# Patient Record
Sex: Female | Born: 1970
Health system: Southern US, Community
[De-identification: ages and names within clinical notes are randomized; demographics above are authoritative.]

## PROBLEM LIST (undated history)

## (undated) ENCOUNTER — Emergency Department (HOSPITAL_COMMUNITY): Admission: EM | Payer: Self-pay | Source: Home / Self Care

## (undated) DIAGNOSIS — T7840XA Allergy, unspecified, initial encounter: Secondary | ICD-10-CM

## (undated) DIAGNOSIS — Z5189 Encounter for other specified aftercare: Secondary | ICD-10-CM

## (undated) DIAGNOSIS — D649 Anemia, unspecified: Secondary | ICD-10-CM

## (undated) DIAGNOSIS — I10 Essential (primary) hypertension: Secondary | ICD-10-CM

## (undated) DIAGNOSIS — K219 Gastro-esophageal reflux disease without esophagitis: Secondary | ICD-10-CM

## (undated) DIAGNOSIS — D259 Leiomyoma of uterus, unspecified: Secondary | ICD-10-CM

## (undated) HISTORY — PX: TUBAL LIGATION: SHX77

## (undated) HISTORY — DX: Encounter for other specified aftercare: Z51.89

## (undated) HISTORY — DX: Anemia, unspecified: D64.9

## (undated) HISTORY — DX: Gastro-esophageal reflux disease without esophagitis: K21.9

## (undated) HISTORY — DX: Allergy, unspecified, initial encounter: T78.40XA

---

## 2000-05-08 ENCOUNTER — Emergency Department (HOSPITAL_COMMUNITY): Admission: EM | Admit: 2000-05-08 | Discharge: 2000-05-08 | Payer: Self-pay | Admitting: Emergency Medicine

## 2001-07-27 ENCOUNTER — Encounter: Payer: Self-pay | Admitting: Emergency Medicine

## 2001-07-27 ENCOUNTER — Emergency Department (HOSPITAL_COMMUNITY): Admission: EM | Admit: 2001-07-27 | Discharge: 2001-07-27 | Payer: Self-pay | Admitting: Emergency Medicine

## 2001-07-29 ENCOUNTER — Ambulatory Visit (HOSPITAL_COMMUNITY): Admission: RE | Admit: 2001-07-29 | Discharge: 2001-07-29 | Payer: Self-pay | Admitting: Emergency Medicine

## 2001-07-29 ENCOUNTER — Encounter: Payer: Self-pay | Admitting: Emergency Medicine

## 2002-04-23 ENCOUNTER — Emergency Department (HOSPITAL_COMMUNITY): Admission: EM | Admit: 2002-04-23 | Discharge: 2002-04-24 | Payer: Self-pay | Admitting: Emergency Medicine

## 2004-09-07 ENCOUNTER — Emergency Department (HOSPITAL_COMMUNITY): Admission: EM | Admit: 2004-09-07 | Discharge: 2004-09-07 | Payer: Self-pay | Admitting: Emergency Medicine

## 2005-04-07 ENCOUNTER — Emergency Department (HOSPITAL_COMMUNITY): Admission: EM | Admit: 2005-04-07 | Discharge: 2005-04-07 | Payer: Self-pay | Admitting: Emergency Medicine

## 2006-12-23 ENCOUNTER — Emergency Department (HOSPITAL_COMMUNITY): Admission: EM | Admit: 2006-12-23 | Discharge: 2006-12-23 | Payer: Self-pay | Admitting: Emergency Medicine

## 2007-02-05 ENCOUNTER — Emergency Department (HOSPITAL_COMMUNITY): Admission: EM | Admit: 2007-02-05 | Discharge: 2007-02-05 | Payer: Self-pay | Admitting: Emergency Medicine

## 2012-07-14 ENCOUNTER — Inpatient Hospital Stay (HOSPITAL_COMMUNITY)
Admission: AD | Admit: 2012-07-14 | Discharge: 2012-07-14 | Disposition: A | Discharge status: Home / Self Care | Payer: Self-pay | Source: Ambulatory Visit | Attending: Obstetrics & Gynecology | Admitting: Obstetrics & Gynecology

## 2012-07-14 ENCOUNTER — Encounter (HOSPITAL_COMMUNITY): Payer: Self-pay | Admitting: *Deleted

## 2012-07-14 DIAGNOSIS — N920 Excessive and frequent menstruation with regular cycle: Secondary | ICD-10-CM | POA: Insufficient documentation

## 2012-07-14 DIAGNOSIS — I10 Essential (primary) hypertension: Secondary | ICD-10-CM | POA: Insufficient documentation

## 2012-07-14 DIAGNOSIS — D259 Leiomyoma of uterus, unspecified: Secondary | ICD-10-CM | POA: Insufficient documentation

## 2012-07-14 DIAGNOSIS — N946 Dysmenorrhea, unspecified: Secondary | ICD-10-CM | POA: Diagnosis present

## 2012-07-14 DIAGNOSIS — R109 Unspecified abdominal pain: Secondary | ICD-10-CM | POA: Insufficient documentation

## 2012-07-14 DIAGNOSIS — Z86018 Personal history of other benign neoplasm: Secondary | ICD-10-CM

## 2012-07-14 HISTORY — DX: Leiomyoma of uterus, unspecified: D25.9

## 2012-07-14 HISTORY — DX: Essential (primary) hypertension: I10

## 2012-07-14 LAB — CBC
Hemoglobin: 10.7 g/dL — ABNORMAL LOW (ref 12.0–15.0)
MCH: 26 pg (ref 26.0–34.0)
MCHC: 31.7 g/dL (ref 30.0–36.0)
MCV: 82.2 fL (ref 78.0–100.0)
RBC: 4.11 MIL/uL (ref 3.87–5.11)

## 2012-07-14 LAB — URINALYSIS, ROUTINE W REFLEX MICROSCOPIC
Bilirubin Urine: NEGATIVE
Glucose, UA: NEGATIVE mg/dL
Hgb urine dipstick: NEGATIVE
Ketones, ur: NEGATIVE mg/dL
Leukocytes, UA: NEGATIVE
Nitrite: NEGATIVE
Protein, ur: NEGATIVE mg/dL
Specific Gravity, Urine: >1.03 — ABNORMAL HIGH (ref 1.005–1.030)
Urobilinogen, UA: 0.2 mg/dL (ref 0.0–1.0)
pH: 6 (ref 5.0–8.0)

## 2012-07-14 LAB — POCT PREGNANCY, URINE: Preg Test, Ur: NEGATIVE

## 2012-07-14 LAB — WET PREP, GENITAL

## 2012-07-14 MED ORDER — IBUPROFEN 600 MG PO TABS: 600.0000 mg | ORAL_TABLET | Freq: Four times a day (QID) | ORAL | Status: DC | PRN

## 2012-07-14 MED ORDER — HYDROCHLOROTHIAZIDE 25 MG PO TABS: 25.0000 mg | ORAL_TABLET | Freq: Every day | ORAL | Status: DC

## 2012-07-14 MED ORDER — KETOROLAC TROMETHAMINE 60 MG/2ML IM SOLN
60.0000 mg | INTRAMUSCULAR | Status: AC
  Administered 2012-07-14: 60 mg via INTRAMUSCULAR
  Filled 2012-07-14: qty 2

## 2012-07-14 NOTE — MAU Provider Note (Signed)
Attestation of Attending Supervision of Advanced Practitioner (CNM/NP): Evaluation and management procedures were performed by the Advanced Practitioner under my supervision and collaboration.  I have reviewed the Advanced Practitioner's note and chart, and I agree with the management and plan.  Kyrsten Deleeuw, MD, FACOG Attending Obstetrician & Gynecologist Faculty Practice, [facility]  

## 2012-07-14 NOTE — MAU Provider Note (Signed)
Chief Complaint: Abdominal Pain   First Provider Initiated Contact with Patient 07/14/12 1432     SUBJECTIVE HPI: Becky Koch is a 40 y.o. [id number] who presents to maternity admissions reporting severe abdominal pain during menses described as cramping, accompanied by pelvic and rectal pressure which is constant. She also had an episode of dizziness today.  She was diagnosed with uterine fibroids ~1 year ago but has never had an U/S for this.  She was prescribed OCPs temporarily to reduce bleeding, which worked, but now she is out of these.  She is also out of her blood pressure medication.  She recently moved to [facility] and needs to establish care.  She denies vaginal itching/burning, urinary symptoms, h/a, n/v, or fever/chills.     Past Medical History  Diagnosis Date  . Fibroid, uterine   . Hypertension    Past Surgical History  Procedure Date  . Tubal ligation    History   Social History  . Marital Status: Legally Separated    Spouse Name: N/A    Number of Children: N/A  . Years of Education: N/A   Occupational History  . Not on file.   Social History Main Topics  . Smoking status: Current Every Day Smoker -- 0.2 packs/day    Types: Cigarettes  . Smokeless tobacco: Not on file  . Alcohol Use: Yes     Comment: social alcohol  . Drug Use: No  . Sexually Active: Yes    Birth Control/ Protection: Surgical   Other Topics Concern  . Not on file   Social History Narrative  . No narrative on file   No current facility-administered medications on file prior to encounter.   No current outpatient prescriptions on file prior to encounter.   No Known Allergies  ROS: Pertinent items in HPI  OBJECTIVE Blood pressure 161/108, pulse 95, temperature 98.8 F (37.1 C), temperature source Oral, resp. rate 18, last menstrual period 06/11/2012. GENERAL: Well-developed, well-nourished female in no acute distress.  HEENT: Normocephalic HEART: normal rate RESP: normal  effort ABDOMEN: Soft, non-tender EXTREMITIES: Nontender, no edema NEURO: Alert and oriented Pelvic exam: Cervix pink, visually closed, without lesion, moderate amount white/yellow creamy discharge, vaginal walls and external genitalia normal Bimanual exam: Cervix 0/long/high, firm, anterior, neg CMT, uterus nontender, nonenlarged, irregularly shaped, mild adnexal tenderness bilaterally, no enlargement, or mass  LAB RESULTS Results for orders placed during the hospital encounter of 07/14/12 (from the past 24 hour(s))  URINALYSIS, ROUTINE W REFLEX MICROSCOPIC     Status: Abnormal   Collection Time   07/14/12  2:05 PM      Component Value Range   Color, Urine YELLOW  YELLOW   APPearance HAZY (*) CLEAR   Specific Gravity, Urine >1.030 (*) 1.005 - 1.030   pH 6.0  5.0 - 8.0   Glucose, UA NEGATIVE  NEGATIVE mg/dL   Hgb urine dipstick NEGATIVE  NEGATIVE   Bilirubin Urine NEGATIVE  NEGATIVE   Ketones, ur NEGATIVE  NEGATIVE mg/dL   Protein, ur NEGATIVE  NEGATIVE mg/dL   Urobilinogen, UA 0.2  0.0 - 1.0 mg/dL   Nitrite NEGATIVE  NEGATIVE   Leukocytes, UA NEGATIVE  NEGATIVE  CBC     Status: Abnormal   Collection Time   07/14/12  2:45 PM      Component Value Range   WBC 4.3  4.0 - 10.5 K/uL   RBC 4.11  3.87 - 5.11 MIL/uL   Hemoglobin 10.7 (*) 12.0 - 15.0 g/dL   HCT 96.2 (*)  36.0 - 46.0 %   MCV 82.2  78.0 - 100.0 fL   MCH 26.0  26.0 - 34.0 pg   MCHC 31.7  30.0 - 36.0 g/dL   RDW 16.1 (*) 09.6 - 04.5 %   Platelets 273  150 - 400 K/uL  WET PREP, GENITAL     Status: Abnormal   Collection Time   07/14/12  2:45 PM      Component Value Range   Yeast Wet Prep HPF POC NONE SEEN  NONE SEEN   Trich, Wet Prep NONE SEEN  NONE SEEN   Clue Cells Wet Prep HPF POC FEW (*) NONE SEEN   WBC, Wet Prep HPF POC FEW (*) NONE SEEN  POCT PREGNANCY, URINE     Status: Normal   Collection Time   07/14/12  3:14 PM      Component Value Range   Preg Test, Ur NEGATIVE  NEGATIVE     ASSESSMENT 1. Dysmenorrhea    2. Menorrhagia   3. History of uterine fibroid   4. HTN (hypertension)     PLAN Discharge home Message sent to Gyn clinic for f/u Outpatient U/S ordered  HCTZ 25 mg daily PO x30 tabs per Dr Macon Large Ibuprofen 600 mg PO Q6 hours PRN Recommend women's multivitamin and increased PO iron Increase PO fluids and fiber Pt given contact information for family practice to f/u for HTN Return to MAU as needed   Sharen Counter Certified Nurse-Midwife 07/14/2012  2:49 PM

## 2012-07-14 NOTE — MAU Note (Signed)
Lower abd pain started 2 years ago, dx'd with fibroids, pain is getting worse & worse.  Pt was on OCP to control her bleeding, but ran out last month.  Has moved, not seeing same dr.  Denies bleeding today.

## 2012-07-15 LAB — GC/CHLAMYDIA PROBE AMP, GENITAL
Chlamydia, DNA Probe: NEGATIVE
GC Probe Amp, Genital: NEGATIVE

## 2012-07-22 ENCOUNTER — Other Ambulatory Visit (HOSPITAL_COMMUNITY): Payer: Self-pay | Admitting: Advanced Practice Midwife

## 2012-07-22 ENCOUNTER — Ambulatory Visit (HOSPITAL_COMMUNITY): Admit: 2012-07-22 | Payer: Self-pay

## 2012-07-22 DIAGNOSIS — N946 Dysmenorrhea, unspecified: Secondary | ICD-10-CM

## 2012-07-22 DIAGNOSIS — N920 Excessive and frequent menstruation with regular cycle: Secondary | ICD-10-CM

## 2012-07-22 DIAGNOSIS — Z86018 Personal history of other benign neoplasm: Secondary | ICD-10-CM

## 2012-07-29 ENCOUNTER — Ambulatory Visit (HOSPITAL_COMMUNITY): Payer: Self-pay | Attending: Advanced Practice Midwife

## 2012-08-06 ENCOUNTER — Ambulatory Visit (HOSPITAL_COMMUNITY)
Admission: RE | Admit: 2012-08-06 | Discharge: 2012-08-06 | Disposition: A | Discharge status: Home / Self Care | Payer: Self-pay | Source: Ambulatory Visit | Attending: Advanced Practice Midwife | Admitting: Advanced Practice Midwife

## 2012-08-06 DIAGNOSIS — N946 Dysmenorrhea, unspecified: Secondary | ICD-10-CM | POA: Insufficient documentation

## 2012-08-06 DIAGNOSIS — N920 Excessive and frequent menstruation with regular cycle: Secondary | ICD-10-CM | POA: Insufficient documentation

## 2012-08-06 DIAGNOSIS — Z86018 Personal history of other benign neoplasm: Secondary | ICD-10-CM

## 2012-09-08 ENCOUNTER — Telehealth: Payer: Self-pay | Admitting: General Practice

## 2012-09-08 NOTE — Telephone Encounter (Signed)
Patient called requesting results from 12/18 ultrasound.

## 2012-09-09 NOTE — Telephone Encounter (Signed)
Returned pt's call and left message on her personal voice mail that per the midwife's note in November, she needs clinic appt for follow up. At this appt she will be informed of her [place] results and be able to discuss her current status so that changes in plan of care can be considered. I have scheduled an appt on 09/12/12 @ 1045. If this day and time is not acceptable, please call back to reschedule.

## 2012-09-12 ENCOUNTER — Encounter: Payer: Self-pay | Admitting: Medical

## 2012-09-16 ENCOUNTER — Emergency Department (INDEPENDENT_AMBULATORY_CARE_PROVIDER_SITE_OTHER)
Admission: EM | Admit: 2012-09-16 | Discharge: 2012-09-16 | Disposition: A | Discharge status: Home / Self Care | Payer: Self-pay | Source: Home / Self Care | Attending: Emergency Medicine | Admitting: Emergency Medicine

## 2012-09-16 ENCOUNTER — Encounter (HOSPITAL_COMMUNITY): Payer: Self-pay | Admitting: *Deleted

## 2012-09-16 DIAGNOSIS — M545 Low back pain, unspecified: Secondary | ICD-10-CM

## 2012-09-16 MED ORDER — MELOXICAM 7.5 MG PO TABS: 7.5000 mg | ORAL_TABLET | Freq: Every day | ORAL | Status: AC

## 2012-09-16 MED ORDER — CYCLOBENZAPRINE HCL 10 MG PO TABS: 10.0000 mg | ORAL_TABLET | Freq: Two times a day (BID) | ORAL | Status: DC | PRN

## 2012-09-16 NOTE — ED Provider Notes (Signed)
History     CSN: [id number]  Arrival date & time 09/16/12  1252   First MD Initiated Contact with Patient 09/16/12 1329      Chief Complaint  Patient presents with  . Back Pain    (Consider location/radiation/quality/duration/timing/severity/associated sxs/prior treatment) HPI Comments: Patient presents urgent care this afternoon complaining of left lower back pain, she describes that she fell at home she slipped on one of her grandson boys, landing on her left side. She's been taking 800 mg of motrin as it has been previously prescribed to her for another problem. Patient denies any urinary symptoms, changes in bowel habits. And denies any numbness or tingling sensation or weakness of her lower extremities. Patient also denies any constitutional symptoms such as unintended weight loss, changes in appetite or fevers.  Patient is a 42 y.o. female presenting with back pain. The history is provided by the patient.  Back Pain  This is a new problem. The problem occurs constantly. The problem has not changed since onset.The pain is associated with falling. The pain is present in the lumbar spine. The quality of the pain is described as stabbing and shooting. The pain does not radiate. The pain is at a severity of 6/10. The patient is experiencing no pain. The symptoms are aggravated by twisting, bending and certain positions. Stiffness is present all day. Pertinent negatives include no chest pain, no fever, no numbness, no weight loss, no abdominal pain, no abdominal swelling, no bowel incontinence, no pelvic pain, no leg pain, no paresthesias, no paresis, no tingling and no weakness. The treatment provided no relief.    Past Medical History  Diagnosis Date  . Fibroid, uterine   . Hypertension     Past Surgical History  Procedure Date  . Tubal ligation     Family History  Problem Relation Age of Onset  . Family history unknown: Yes    History  Substance Use Topics  . Smoking  status: Current Every Day Smoker -- 0.2 packs/day    Types: Cigarettes  . Smokeless tobacco: Not on file  . Alcohol Use: Yes     Comment: social alcohol    OB History    Grav Para Term Preterm Abortions TAB SAB Ect Mult Living   8 4 4  4 3 1   4       Review of Systems  Constitutional: Positive for activity change. Negative for fever, chills, weight loss, diaphoresis, appetite change and fatigue.  HENT: Negative for facial swelling, neck pain, neck stiffness and ear discharge.   Respiratory: Negative for shortness of breath.   Cardiovascular: Negative for chest pain.  Gastrointestinal: Negative for abdominal pain and bowel incontinence.  Genitourinary: Negative for flank pain and pelvic pain.  Musculoskeletal: Positive for back pain. Negative for myalgias, joint swelling, arthralgias and gait problem.  Skin: Negative for rash and wound.  Neurological: Negative for tingling, weakness, numbness and paresthesias.    Allergies  Review of patient's allergies indicates no known allergies.  Home Medications   Current Outpatient Rx  Name  Route  Sig  Dispense  Refill  . CYCLOBENZAPRINE HCL 10 MG PO TABS   Oral   Take 1 tablet (10 mg total) by mouth 2 (two) times daily as needed for muscle spasms.   20 tablet   0   . HYDROCHLOROTHIAZIDE 25 MG PO TABS   Oral   Take 1 tablet (25 mg total) by mouth daily.   30 tablet   0   .  MELOXICAM 7.5 MG PO TABS   Oral   Take 1 tablet (7.5 mg total) by mouth daily.   10 tablet   0     BP 169/101  Pulse 86  Temp 98.3 F (36.8 C) (Oral)  Resp 18  SpO2 100%  LMP 09/06/2012  Physical Exam  Nursing note and vitals reviewed. Constitutional: Vital signs are normal. She appears well-developed and well-nourished.  Non-toxic appearance. She does not have a sickly appearance. She does not appear ill. No distress.  Musculoskeletal: She exhibits tenderness. She exhibits no edema.       Lumbar back: She exhibits decreased range of motion,  tenderness and pain. She exhibits no bony tenderness, no swelling, no edema, no deformity, no laceration and no spasm.       Back:  Neurological: She is alert.  Skin: No rash noted. No erythema.    ED Course  Procedures (including critical care time)  Labs Reviewed - No data to display No results found.   1. Lumbar back pain       MDM  Status post fall 4 days ago with left paravertebral lower lumbar pain. Exam symptoms not consistent with fractures or subluxations. Prescribe patient and a course of blocks and will muscle relaxer encouraged and instructed to followup with orthopedic doctor pain was to persist beyond 7-10 days. Patient agreed with treatment plan and followup care        Jimmie Molly, MD 09/16/12 1530

## 2012-09-16 NOTE — ED Notes (Signed)
Pt reports back pain that started after she fell on a toy and landed on back./hip

## 2012-10-02 ENCOUNTER — Encounter: Payer: Self-pay | Admitting: Medical

## 2012-10-24 ENCOUNTER — Encounter: Payer: Self-pay | Admitting: Obstetrics & Gynecology

## 2012-11-13 ENCOUNTER — Encounter: Payer: Self-pay | Admitting: Obstetrics & Gynecology

## 2012-11-13 ENCOUNTER — Other Ambulatory Visit: Payer: Self-pay | Admitting: Obstetrics & Gynecology

## 2012-11-13 ENCOUNTER — Ambulatory Visit (INDEPENDENT_AMBULATORY_CARE_PROVIDER_SITE_OTHER): Payer: BC Managed Care – PPO | Admitting: Obstetrics & Gynecology

## 2012-11-13 VITALS — BP 138/92 | HR 93 | Ht 65.5 in | Wt 131.7 lb

## 2012-11-13 DIAGNOSIS — N946 Dysmenorrhea, unspecified: Secondary | ICD-10-CM

## 2012-11-13 DIAGNOSIS — I10 Essential (primary) hypertension: Secondary | ICD-10-CM

## 2012-11-13 DIAGNOSIS — Z0000 Encounter for general adult medical examination without abnormal findings: Secondary | ICD-10-CM

## 2012-11-13 DIAGNOSIS — N920 Excessive and frequent menstruation with regular cycle: Secondary | ICD-10-CM

## 2012-11-13 MED ORDER — LOSARTAN POTASSIUM-HCTZ 100-12.5 MG PO TABS: 1.0000 | ORAL_TABLET | Freq: Every day | ORAL | Status: DC

## 2012-11-13 NOTE — Progress Notes (Signed)
Patient ID: Becky Koch, female   DOB: 09-01-1970, 42 y.o.   MRN: [id number]  Chief Complaint  Patient presents with  . Fibroids    HPI Becky Koch is a 42 y.o. female.  [id number] Patient's last menstrual period was 09/22/2012. S/P BTL, long H/O dysmenorrhea and menorrhagia, menses regular, 8 days.  HPI  Past Medical History  Diagnosis Date  . Fibroid, uterine   . Hypertension     Past Surgical History  Procedure Laterality Date  . Tubal ligation      No family history on file.  Social History History  Substance Use Topics  . Smoking status: Current Every Day Smoker -- 0.25 packs/day    Types: Cigarettes  . Smokeless tobacco: Not on file  . Alcohol Use: Yes     Comment: social alcohol    No Known Allergies  Current Outpatient Prescriptions  Medication Sig Dispense Refill  . losartan-hydrochlorothiazide (HYZAAR) 100-12.5 MG per tablet Take 1 tablet by mouth daily.  30 tablet  1  . Multiple Vitamins-Minerals (WOMENS MULTI VITAMIN & MINERAL PO) Take 1 tablet by mouth.       No current facility-administered medications for this visit.    Review of Systems Review of Systems  Constitutional: Negative.   Genitourinary: Positive for menstrual problem and pelvic pain. Negative for dysuria, vaginal bleeding and vaginal discharge.    Blood pressure 138/92, pulse 93, height 5' 5.5" (1.664 m), weight 131 lb 11.2 oz (59.739 kg), last menstrual period 09/22/2012.  Physical Exam Physical Exam  Constitutional: She is oriented to person, place, and time. She appears well-developed and well-nourished. No distress.  Pulmonary/Chest: Effort normal. No respiratory distress.  Abdominal: Soft. There is no tenderness.  Genitourinary: Vagina normal and uterus normal. No vaginal discharge found.  Pap done no mass  Neurological: She is alert and oriented to person, place, and time.  Skin: Skin is warm and dry. No pallor.  Psychiatric: She has a normal mood and affect. Her behavior  is normal.    Data Reviewed   *RADIOLOGY REPORT*  Clinical Data: Dysmenorrhea. Menorrhagia. Fibroids. LMP  07/30/2012.  TRANSABDOMINAL AND TRANSVAGINAL ULTRASOUND OF PELVIS  Technique: Both transabdominal and transvaginal ultrasound  examinations of the pelvis were performed. Transabdominal  technique was performed for global imaging of the pelvis including  uterus, ovaries, adnexal regions, and pelvic cul-de-sac.  It was necessary to proceed with endovaginal exam following the  transabdominal exam to visualize the endometrium and adnexa.  Comparison: None.  Findings:  Uterus: 10.0 x 4.4 x 6.2 cm. No fibroids or other uterine mass  identified.  Endometrium: Double layer thickness measures 14 mm transvaginally.  No focal lesion visualized.  Right ovary: 2.6 x 1.5 x 1.8 cm. Normal appearance.  Left ovary: 4.7 x 2.0 x 2.7 cm. Normal appearance.  Other Findings: No free fluid  IMPRESSION:  1. No evidence of pelvic mass or other significant abnormality.  2. Endometrial thickness measures 14 mm. If bleeding remains  unresponsive to hormonal or medical therapy, sonohysterogram should  be considered for focal lesion work-up. (Ref: Radiological  Reasoning: Algorithmic Workup of Abnormal Vaginal Bleeding with  Endovaginal Sonography and Sonohysterography. AJR 2008; 147:W29-56)  Original Report Authenticated By: Myles Rosenthal, M.D.   Assessment    Dysmenorrhea and menorrhagia, nl [place]     Plan    Considered hormonal management, ablation, Mirena. Wants to schedule Mirena, procedure discussed and questions answered. Take NSAID pre-insertion        ARNOLD,JAMES 11/13/2012, 5:17 PM

## 2012-11-13 NOTE — Patient Instructions (Addendum)
Levonorgestrel intrauterine device (IUD) What is this medicine? LEVONORGESTREL IUD (LEE voe nor jes trel) is a contraceptive (birth control) device. The device is placed inside the uterus by a healthcare professional. It is used to prevent pregnancy and can also be used to treat heavy bleeding that occurs during your period. Depending on the device, it can be used for 3 to 5 years. This medicine may be used for other purposes; ask your health care provider or pharmacist if you have questions. What should I tell my health care provider before I take this medicine? They need to know if you have any of these conditions: -abnormal Pap smear -cancer of the breast, uterus, or cervix -diabetes -endometritis -genital or pelvic infection now or in the past -have more than one sexual partner or your partner has more than one partner -heart disease -history of an ectopic or tubal pregnancy -immune system problems -IUD in place -liver disease or tumor -problems with blood clots or take blood-thinners -use intravenous drugs -uterus of unusual shape -vaginal bleeding that has not been explained -an unusual or allergic reaction to levonorgestrel, other hormones, silicone, or polyethylene, medicines, foods, dyes, or preservatives -pregnant or trying to get pregnant -breast-feeding How should I use this medicine? This device is placed inside the uterus by a health care professional. Talk to your pediatrician regarding the use of this medicine in children. Special care may be needed. Overdosage: If you think you have taken too much of this medicine contact a poison control center or emergency room at once. NOTE: This medicine is only for you. Do not share this medicine with others. What if I miss a dose? This does not apply. What may interact with this medicine? Do not take this medicine with any of the following medications: -amprenavir -bosentan -fosamprenavir This medicine may also interact with  the following medications: -aprepitant -barbiturate medicines for inducing sleep or treating seizures -bexarotene -griseofulvin -medicines to treat seizures like carbamazepine, ethotoin, felbamate, oxcarbazepine, phenytoin, topiramate -modafinil -pioglitazone -rifabutin -rifampin -rifapentine -some medicines to treat HIV infection like atazanavir, indinavir, lopinavir, nelfinavir, tipranavir, ritonavir -St. John's wort -warfarin This list may not describe all possible interactions. Give your health care provider a list of all the medicines, herbs, non-prescription drugs, or dietary supplements you use. Also tell them if you smoke, drink alcohol, or use illegal drugs. Some items may interact with your medicine. What should I watch for while using this medicine? Visit your doctor or health care professional for regular check ups. See your doctor if you or your partner has sexual contact with others, becomes HIV positive, or gets a sexual transmitted disease. This product does not protect you against HIV infection (AIDS) or other sexually transmitted diseases. You can check the placement of the IUD yourself by reaching up to the top of your vagina with clean fingers to feel the threads. Do not pull on the threads. It is a good habit to check placement after each menstrual period. Call your doctor right away if you feel more of the IUD than just the threads or if you cannot feel the threads at all. The IUD may come out by itself. You may become pregnant if the device comes out. If you notice that the IUD has come out use a backup birth control method like condoms and call your health care provider. Using tampons will not change the position of the IUD and are okay to use during your period. What side effects may I notice from receiving this medicine?   Side effects that you should report to your doctor or health care professional as soon as possible: -allergic reactions like skin rash, itching or  hives, swelling of the face, lips, or tongue -fever, flu-like symptoms -genital sores -high blood pressure -no menstrual period for 6 weeks during use -pain, swelling, warmth in the leg -pelvic pain or tenderness -severe or sudden headache -signs of pregnancy -stomach cramping -sudden shortness of breath -trouble with balance, talking, or walking -unusual vaginal bleeding, discharge -yellowing of the eyes or skin Side effects that usually do not require medical attention (report to your doctor or health care professional if they continue or are bothersome): -acne -breast pain -change in sex drive or performance -changes in weight -cramping, dizziness, or faintness while the device is being inserted -headache -irregular menstrual bleeding within first 3 to 6 months of use -nausea This list may not describe all possible side effects. Call your doctor for medical advice about side effects. You may report side effects to FDA at 1-800-FDA-1088. Where should I keep my medicine? This does not apply. NOTE: This sheet is a summary. It may not cover all possible information. If you have questions about this medicine, talk to your doctor, pharmacist, or health care provider.  2013, [employer]vier/Gold Standard. (09/06/2011 1:54:04 PM)  

## 2012-11-14 ENCOUNTER — Emergency Department (HOSPITAL_COMMUNITY)
Admission: EM | Admit: 2012-11-14 | Discharge: 2012-11-14 | Disposition: A | Discharge status: Home / Self Care | Payer: No Typology Code available for payment source | Attending: Emergency Medicine | Admitting: Emergency Medicine

## 2012-11-14 ENCOUNTER — Emergency Department (HOSPITAL_COMMUNITY): Payer: No Typology Code available for payment source

## 2012-11-14 ENCOUNTER — Encounter (HOSPITAL_COMMUNITY): Payer: Self-pay | Admitting: *Deleted

## 2012-11-14 DIAGNOSIS — S0990XA Unspecified injury of head, initial encounter: Secondary | ICD-10-CM | POA: Insufficient documentation

## 2012-11-14 DIAGNOSIS — T148XXA Other injury of unspecified body region, initial encounter: Secondary | ICD-10-CM

## 2012-11-14 DIAGNOSIS — I10 Essential (primary) hypertension: Secondary | ICD-10-CM | POA: Insufficient documentation

## 2012-11-14 DIAGNOSIS — IMO0002 Reserved for concepts with insufficient information to code with codable children: Secondary | ICD-10-CM | POA: Insufficient documentation

## 2012-11-14 DIAGNOSIS — Z79899 Other long term (current) drug therapy: Secondary | ICD-10-CM | POA: Insufficient documentation

## 2012-11-14 DIAGNOSIS — Y9389 Activity, other specified: Secondary | ICD-10-CM | POA: Insufficient documentation

## 2012-11-14 DIAGNOSIS — Y92410 Unspecified street and highway as the place of occurrence of the external cause: Secondary | ICD-10-CM | POA: Insufficient documentation

## 2012-11-14 DIAGNOSIS — F17200 Nicotine dependence, unspecified, uncomplicated: Secondary | ICD-10-CM | POA: Insufficient documentation

## 2012-11-14 DIAGNOSIS — S4980XA Other specified injuries of shoulder and upper arm, unspecified arm, initial encounter: Secondary | ICD-10-CM | POA: Insufficient documentation

## 2012-11-14 DIAGNOSIS — Z8742 Personal history of other diseases of the female genital tract: Secondary | ICD-10-CM | POA: Insufficient documentation

## 2012-11-14 DIAGNOSIS — T07XXXA Unspecified multiple injuries, initial encounter: Secondary | ICD-10-CM | POA: Insufficient documentation

## 2012-11-14 DIAGNOSIS — S46909A Unspecified injury of unspecified muscle, fascia and tendon at shoulder and upper arm level, unspecified arm, initial encounter: Secondary | ICD-10-CM | POA: Insufficient documentation

## 2012-11-14 DIAGNOSIS — S139XXA Sprain of joints and ligaments of unspecified parts of neck, initial encounter: Secondary | ICD-10-CM

## 2012-11-14 MED ORDER — HYDROCODONE-ACETAMINOPHEN 5-325 MG PO TABS: 2.0000 | ORAL_TABLET | ORAL | Status: DC | PRN

## 2012-11-14 MED ORDER — HYDROCODONE-ACETAMINOPHEN 5-325 MG PO TABS
1.0000 | ORAL_TABLET | Freq: Once | ORAL | Status: AC
  Administered 2012-11-14: 1 via ORAL
  Filled 2012-11-14: qty 1
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
## 2012-11-17 ENCOUNTER — Ambulatory Visit (HOSPITAL_COMMUNITY)
Admission: RE | Admit: 2012-11-17 | Discharge: 2012-11-17 | Disposition: A | Discharge status: Home / Self Care | Payer: BC Managed Care – PPO | Source: Ambulatory Visit | Attending: Obstetrics & Gynecology | Admitting: Obstetrics & Gynecology

## 2012-11-17 DIAGNOSIS — Z0000 Encounter for general adult medical examination without abnormal findings: Secondary | ICD-10-CM

## 2012-11-17 DIAGNOSIS — Z1231 Encounter for screening mammogram for malignant neoplasm of breast: Secondary | ICD-10-CM

## 2012-12-08 ENCOUNTER — Ambulatory Visit: Payer: BC Managed Care – PPO | Admitting: Obstetrics & Gynecology

## 2012-12-31 ENCOUNTER — Encounter: Payer: Self-pay | Admitting: Obstetrics & Gynecology

## 2012-12-31 ENCOUNTER — Ambulatory Visit (INDEPENDENT_AMBULATORY_CARE_PROVIDER_SITE_OTHER): Payer: BC Managed Care – PPO | Admitting: Obstetrics & Gynecology

## 2012-12-31 VITALS — BP 135/91 | HR 68 | Temp 97.4°F | Ht 65.5 in | Wt 135.4 lb

## 2012-12-31 DIAGNOSIS — Z30430 Encounter for insertion of intrauterine contraceptive device: Secondary | ICD-10-CM

## 2012-12-31 DIAGNOSIS — N920 Excessive and frequent menstruation with regular cycle: Secondary | ICD-10-CM

## 2012-12-31 DIAGNOSIS — N946 Dysmenorrhea, unspecified: Secondary | ICD-10-CM

## 2012-12-31 NOTE — Progress Notes (Signed)
Patient ID: Becky Koch, female   DOB: 16-Nov-1970, 42 y.o.   MRN: [id number] Patient's last menstrual period was 12/15/2012.She elects to have Mirena for management of menorrhagia Patient identified, informed consent performed, signed copy in chart, time out was performed.  She has had BTL  Speculum placed in the vagina.  Cervix visualized.  Cleaned with Betadine x 2.   Hurricaine spray applied. Grasped anteriorly with a single tooth tenaculum.  Uterus sounded to 7.5 cm.  Mirena IUD placed per manufacturer's recommendations.  Strings trimmed to 3 cm.   Patient given post procedure instructions and Mirena care card with expiration date.  Patient is asked to check IUD strings periodically and follow up in 4-6 weeks for IUD check.  Adam Phenix, MD 12/31/2012 4:25 PM

## 2012-12-31 NOTE — Patient Instructions (Signed)
Levonorgestrel intrauterine device (IUD) What is this medicine? LEVONORGESTREL IUD (LEE voe nor jes trel) is a contraceptive (birth control) device. The device is placed inside the uterus by a healthcare professional. It is used to prevent pregnancy and can also be used to treat heavy bleeding that occurs during your period. Depending on the device, it can be used for 3 to 5 years. This medicine may be used for other purposes; ask your health care provider or pharmacist if you have questions. What should I tell my health care provider before I take this medicine? They need to know if you have any of these conditions: -abnormal Pap smear -cancer of the breast, uterus, or cervix -diabetes -endometritis -genital or pelvic infection now or in the past -have more than one sexual partner or your partner has more than one partner -heart disease -history of an ectopic or tubal pregnancy -immune system problems -IUD in place -liver disease or tumor -problems with blood clots or take blood-thinners -use intravenous drugs -uterus of unusual shape -vaginal bleeding that has not been explained -an unusual or allergic reaction to levonorgestrel, other hormones, silicone, or polyethylene, medicines, foods, dyes, or preservatives -pregnant or trying to get pregnant -breast-feeding How should I use this medicine? This device is placed inside the uterus by a health care professional. Talk to your pediatrician regarding the use of this medicine in children. Special care may be needed. Overdosage: If you think you have taken too much of this medicine contact a poison control center or emergency room at once. NOTE: This medicine is only for you. Do not share this medicine with others. What if I miss a dose? This does not apply. What may interact with this medicine? Do not take this medicine with any of the following medications: -amprenavir -bosentan -fosamprenavir This medicine may also interact with  the following medications: -aprepitant -barbiturate medicines for inducing sleep or treating seizures -bexarotene -griseofulvin -medicines to treat seizures like carbamazepine, ethotoin, felbamate, oxcarbazepine, phenytoin, topiramate -modafinil -pioglitazone -rifabutin -rifampin -rifapentine -some medicines to treat HIV infection like atazanavir, indinavir, lopinavir, nelfinavir, tipranavir, ritonavir -St. John's wort -warfarin This list may not describe all possible interactions. Give your health care provider a list of all the medicines, herbs, non-prescription drugs, or dietary supplements you use. Also tell them if you smoke, drink alcohol, or use illegal drugs. Some items may interact with your medicine. What should I watch for while using this medicine? Visit your doctor or health care professional for regular check ups. See your doctor if you or your partner has sexual contact with others, becomes HIV positive, or gets a sexual transmitted disease. This product does not protect you against HIV infection (AIDS) or other sexually transmitted diseases. You can check the placement of the IUD yourself by reaching up to the top of your vagina with clean fingers to feel the threads. Do not pull on the threads. It is a good habit to check placement after each menstrual period. Call your doctor right away if you feel more of the IUD than just the threads or if you cannot feel the threads at all. The IUD may come out by itself. You may become pregnant if the device comes out. If you notice that the IUD has come out use a backup birth control method like condoms and call your health care provider. Using tampons will not change the position of the IUD and are okay to use during your period. What side effects may I notice from receiving this medicine?   Side effects that you should report to your doctor or health care professional as soon as possible: -allergic reactions like skin rash, itching or  hives, swelling of the face, lips, or tongue -fever, flu-like symptoms -genital sores -high blood pressure -no menstrual period for 6 weeks during use -pain, swelling, warmth in the leg -pelvic pain or tenderness -severe or sudden headache -signs of pregnancy -stomach cramping -sudden shortness of breath -trouble with balance, talking, or walking -unusual vaginal bleeding, discharge -yellowing of the eyes or skin Side effects that usually do not require medical attention (report to your doctor or health care professional if they continue or are bothersome): -acne -breast pain -change in sex drive or performance -changes in weight -cramping, dizziness, or faintness while the device is being inserted -headache -irregular menstrual bleeding within first 3 to 6 months of use -nausea This list may not describe all possible side effects. Call your doctor for medical advice about side effects. You may report side effects to FDA at 1-800-FDA-1088. Where should I keep my medicine? This does not apply. NOTE: This sheet is a summary. It may not cover all possible information. If you have questions about this medicine, talk to your doctor, pharmacist, or health care provider.  2013, [employer]vier/[employer] Standard. (09/06/2011 1:54:04 PM)  

## 2013-01-01 MED ORDER — LEVONORGESTREL 20 MCG/24HR IU IUD: 1.0000 | INTRAUTERINE_SYSTEM | Freq: Once | INTRAUTERINE | Status: DC

## 2013-01-05 ENCOUNTER — Encounter: Payer: Self-pay | Admitting: *Deleted

## 2013-01-28 ENCOUNTER — Ambulatory Visit: Payer: BC Managed Care – PPO | Admitting: Obstetrics & Gynecology

## 2013-04-13 ENCOUNTER — Inpatient Hospital Stay (HOSPITAL_COMMUNITY)
Admission: AD | Admit: 2013-04-13 | Discharge: 2013-04-13 | Disposition: A | Discharge status: Home / Self Care | Payer: Self-pay | Source: Ambulatory Visit | Attending: Obstetrics & Gynecology | Admitting: Obstetrics & Gynecology

## 2013-04-13 DIAGNOSIS — Z30431 Encounter for routine checking of intrauterine contraceptive device: Secondary | ICD-10-CM | POA: Insufficient documentation

## 2013-04-13 DIAGNOSIS — I10 Essential (primary) hypertension: Secondary | ICD-10-CM | POA: Insufficient documentation

## 2013-04-13 DIAGNOSIS — M542 Cervicalgia: Secondary | ICD-10-CM | POA: Insufficient documentation

## 2013-04-13 LAB — POCT PREGNANCY, URINE: Preg Test, Ur: NEGATIVE

## 2013-04-13 MED ORDER — LOSARTAN POTASSIUM 100 MG PO TABS: 100.0000 mg | ORAL_TABLET | Freq: Every day | ORAL | Status: DC

## 2013-04-13 MED ORDER — HYDROCHLOROTHIAZIDE 25 MG PO TABS: 25.0000 mg | ORAL_TABLET | Freq: Every day | ORAL | Status: DC

## 2013-04-13 NOTE — MAU Provider Note (Signed)
Attestation of Attending Supervision of Advanced Practitioner (CNM/NP): Evaluation and management procedures were performed by the Advanced Practitioner under my supervision and collaboration.  I have reviewed the Advanced Practitioner's note and chart, and I agree with the management and plan.  HARRAWAY-SMITH, Levaeh Vice 5:39 PM     

## 2013-04-13 NOTE — MAU Provider Note (Signed)
  History     CSN: [id number]  Arrival date and time: 04/13/13 0800   First Provider Initiated Contact with Patient 04/13/13 [phone]      Chief Complaint  Patient presents with  . Hypertension   HPI Comments: Becky Koch 42 y.o. [id number] is in MAU today with elevated BP as she had run out of her Hyzaar ten days ago. She has not established care yet with primary but intends to see Dr Ike Bene and make an appointment today. She noticed some swelling in her ankles yesterday. No other complaints.     Hypertension      Past Medical History  Diagnosis Date  . Fibroid, uterine   . Hypertension     Past Surgical History  Procedure Laterality Date  . Tubal ligation      No family history on file.  History  Substance Use Topics  . Smoking status: Current Every Day Smoker -- 0.25 packs/day    Types: Cigarettes  . Smokeless tobacco: Not on file  . Alcohol Use: Yes     Comment: social alcohol    Allergies: No Known Allergies  Prescriptions prior to admission  Medication Sig Dispense Refill  . ferrous fumarate (HEMOCYTE - 106 MG FE) 325 (106 FE) MG TABS Take 1 tablet by mouth.      Marland Kitchen HYDROcodone-acetaminophen (NORCO/VICODIN) 5-325 MG per tablet Take 2 tablets by mouth every 4 (four) hours as needed for pain.  10 tablet  0  . levonorgestrel (MIRENA) 20 MCG/24HR IUD 1 Intra Uterine Device (1 each total) by Intrauterine route once.  1 each  0  . losartan-hydrochlorothiazide (HYZAAR) 100-12.5 MG per tablet Take 1 tablet by mouth daily.  30 tablet  1  . Multiple Vitamin (MULTIVITAMIN WITH MINERALS) TABS Take 1 tablet by mouth daily.        Review of Systems  Constitutional: Negative.   HENT: Negative.   Eyes: Negative.   Respiratory: Negative.   Cardiovascular: Negative.   Gastrointestinal: Negative.   Genitourinary: Negative.   Musculoskeletal: Negative.        Swelling in feet yesterday  Skin: Negative.   Neurological: Negative.   Endo/Heme/Allergies: Negative.    Psychiatric/Behavioral: Negative.    Physical Exam   Blood pressure 159/100, pulse 79, temperature 98.7 F (37.1 C), temperature source Oral, resp. rate 16, height 5' 5.5" (1.664 m), weight 61.916 kg (136 lb 8 oz), last menstrual period 03/22/2013.  Physical Exam  Constitutional: She is oriented to person, place, and time. She appears well-developed and well-nourished. No distress.  HENT:  Head: Normocephalic.  Eyes: Pupils are equal, round, and reactive to light.  Cardiovascular: Normal rate, regular rhythm and normal heart sounds.   Respiratory: Effort normal and breath sounds normal. No respiratory distress.  Musculoskeletal: Normal range of motion. She exhibits no edema and no tenderness.  Neurological: She is alert and oriented to person, place, and time.  Skin: Skin is dry.  Psychiatric: She has a normal mood and affect.    MAU Course  Procedures  MDM  Assessment and Plan  A: HTN P: Due to cost of Hyzaar will split rx into 2 generics HCTZ and Losartin  Advised to see Dr Ike Bene asap Advised potassium replacement daily with OJ and banana   Carolynn Serve 04/13/2013, 9:44 AM

## 2013-04-13 NOTE — MAU Note (Signed)
Pt states had IUD placed a few months ago in clinic, and was given rx for bp meds. Moved here recently and has not established PCP, is here for eval of HTN, was 170/100's at home last pm. Also notes slight l sided neck pain.

## 2013-11-25 ENCOUNTER — Emergency Department (HOSPITAL_COMMUNITY)
Admission: EM | Admit: 2013-11-25 | Discharge: 2013-11-25 | Disposition: A | Discharge status: Home / Self Care | Payer: BC Managed Care – PPO | Attending: Emergency Medicine | Admitting: Emergency Medicine

## 2013-11-25 ENCOUNTER — Encounter (HOSPITAL_COMMUNITY): Payer: Self-pay | Admitting: Emergency Medicine

## 2013-11-25 DIAGNOSIS — Z8742 Personal history of other diseases of the female genital tract: Secondary | ICD-10-CM | POA: Insufficient documentation

## 2013-11-25 DIAGNOSIS — I10 Essential (primary) hypertension: Secondary | ICD-10-CM | POA: Insufficient documentation

## 2013-11-25 DIAGNOSIS — IMO0002 Reserved for concepts with insufficient information to code with codable children: Secondary | ICD-10-CM | POA: Insufficient documentation

## 2013-11-25 DIAGNOSIS — F17200 Nicotine dependence, unspecified, uncomplicated: Secondary | ICD-10-CM | POA: Insufficient documentation

## 2013-11-25 DIAGNOSIS — M7710 Lateral epicondylitis, unspecified elbow: Secondary | ICD-10-CM

## 2013-11-25 DIAGNOSIS — Z79899 Other long term (current) drug therapy: Secondary | ICD-10-CM | POA: Insufficient documentation

## 2013-11-25 MED ORDER — HYDROCODONE-ACETAMINOPHEN 5-325 MG PO TABS: 1.0000 | ORAL_TABLET | ORAL | Status: DC | PRN

## 2013-11-25 MED ORDER — PREDNISONE 10 MG PO TABS: 40.0000 mg | ORAL_TABLET | Freq: Every day | ORAL | Status: DC

## 2013-11-25 NOTE — ED Notes (Signed)
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   She states she started having elbow and forearm pain x 1 month ago.   Patient states it's getting worse.   Patient BP high today, but she advised that she has a history of same and did take her medication this morning.

## 2013-11-25 NOTE — Discharge Instructions (Signed)
Tennis Elbow Your caregiver has diagnosed you with a condition often referred to as "tennis elbow." This results from small tears or soreness (inflammation) at the start (origin) of the extensor muscles of the forearm. Although the condition is often called tennis or golfer's elbow, it is caused by any repetitive action performed by your elbow. HOME CARE INSTRUCTIONS  If the condition has been short lived, rest may be the only treatment required. Using your opposite hand or arm to perform the task may help. Even changing your grip may help rest the extremity. These may even prevent the condition from recurring.  Longer standing problems, however, will often be relieved faster by:  Using anti-inflammatory agents.  Applying ice packs for 30 minutes at the end of the working day, at bed time, or when activities are finished.  Your caregiver may also have you wear a splint or sling. This will allow the inflamed tendon to heal. At times, steroid injections aided with a local anesthetic will be required along with splinting for 1 to 2 weeks. Two to three steroid injections will often solve the problem. In some long standing cases, the inflamed tendon does not respond to conservative (non-surgical) therapy. Then surgery may be required to repair it. MAKE SURE YOU:   Understand these instructions.  Will watch your condition.  Will get help right away if you are not doing well or get worse. Document Released: 08/06/2005 Document Revised: 10/29/2011 Document Reviewed: 03/24/2008 [facility] Patient Information 2014 [place].  Tendinitis Tendinitis is swelling and inflammation of the tendons. Tendons are band-like tissues that connect muscle to bone. Tendinitis commonly occurs in the:   Shoulders (rotator cuff).  Heels (Achilles tendon).  Elbows (triceps tendon). CAUSES Tendinitis is usually caused by overusing the tendon, muscles, and joints involved. When the tissue surrounding a tendon  (synovium) becomes inflamed, it is called tenosynovitis. Tendinitis commonly develops in people whose jobs require repetitive motions. SYMPTOMS  Pain.  Tenderness.  Mild swelling. DIAGNOSIS Tendinitis is usually diagnosed by physical exam. Your caregiver may also order X-rays or other imaging tests. TREATMENT Your caregiver may recommend certain medicines or exercises for your treatment. HOME CARE INSTRUCTIONS   Use a sling or splint for as long as directed by your caregiver until the pain decreases.  Put ice on the injured area.  Put ice in a plastic bag.  Place a towel between your skin and the bag.  Leave the ice on for 15-20 minutes, 03-04 times a day.  Avoid using the limb while the tendon is painful. Perform gentle range of motion exercises only as directed by your caregiver. Stop exercises if pain or discomfort increase, unless directed otherwise by your caregiver.  Only take over-the-counter or prescription medicines for pain, discomfort, or fever as directed by your caregiver. SEEK MEDICAL CARE IF:   Your pain and swelling increase.  You develop new, unexplained symptoms, especially increased numbness in the hands. MAKE SURE YOU:   Understand these instructions.  Will watch your condition.  Will get help right away if you are not doing well or get worse. Document Released: 08/03/2000 Document Revised: 10/29/2011 Document Reviewed: 10/23/2010 [facility] Patient Information 2014 [place], [place].

## 2013-11-25 NOTE — ED Provider Notes (Signed)
CSN: [id number]     Arrival date & time 11/25/13  5102 History   First MD Initiated Contact with Patient 11/25/13 [phone]   This chart was scribed for Earney Navy, a non-physician practitioner working with No att. providers found by Denice Bors, ED Scribe. This patient was seen in room TR06C/TR06C and the patient's care was started at 9:40 AM      Chief Complaint  Patient presents with  . Arm Pain     (Consider location/radiation/quality/duration/timing/severity/associated sxs/prior Treatment) The history is provided by the patient. No language interpreter was used.   HPI Comments: Becky Koch is a 43 y.o. female who presents to the Emergency Department complaining of constant right arm pain onset 1 month ago. Describes pain as worsening in severity. 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 Reports pain is exacerbated by movement. Denies any alleviating factors. Reports trying ibuprofen with no relief of symptoms. Denies associated numbness, recent trauma, and fever.   Past Medical History  Diagnosis Date  . Fibroid, uterine   . Hypertension    Past Surgical History  Procedure Laterality Date  . Tubal ligation     No family history on file. History  Substance Use Topics  . Smoking status: Current Every Day Smoker -- 0.25 packs/day    Types: Cigarettes  . Smokeless tobacco: Not on file  . Alcohol Use: Yes     Comment: social alcohol   OB History   Grav Para Term Preterm Abortions TAB SAB Ect Mult Living   8 4 4  4 3 1   4      Review of Systems  Constitutional: Negative for fever.  Musculoskeletal: Positive for myalgias (right arm pain ).  Skin: Negative for wound.      Allergies  Review of patient's allergies indicates no known allergies.  Home Medications   Current Outpatient Rx  Name  Route  Sig  Dispense  Refill  . esomeprazole (NEXIUM) 20 MG capsule   Oral   Take 20 mg by mouth daily at 12 noon.         . ferrous  fumarate (HEMOCYTE - 106 MG FE) 325 (106 FE) MG TABS   Oral   Take 1 tablet by mouth.         Marland Kitchen ibuprofen (ADVIL,MOTRIN) 200 MG tablet   Oral   Take 600 mg by mouth every 6 (six) hours as needed for mild pain.         Marland Kitchen levonorgestrel (MIRENA) 20 MCG/24HR IUD   Intrauterine   1 Intra Uterine Device (1 each total) by Intrauterine route once.   1 each   0   . losartan-hydrochlorothiazide (HYZAAR) 100-12.5 MG per tablet   Oral   Take 1 tablet by mouth daily.         . Multiple Vitamin (MULTIVITAMIN WITH MINERALS) TABS   Oral   Take 1 tablet by mouth daily.         Marland Kitchen HYDROcodone-acetaminophen (NORCO/VICODIN) 5-325 MG per tablet   Oral   Take 1-2 tablets by mouth every 4 (four) hours as needed.   20 tablet   0   . predniSONE (DELTASONE) 10 MG tablet   Oral   Take 4 tablets (40 mg total) by mouth daily.   20 tablet   0    BP 168/113  Pulse 82  Temp(Src) 98.5 F (36.9 C) (Oral)  Resp 18  Ht 5\' 5"  (1.651 m)  Wt 135 lb (61.236 kg)  BMI 22.47 kg/m2  SpO2 100%  LMP 11/01/2013 Physical Exam  Nursing note and vitals reviewed. Constitutional: She is oriented to person, place, and time. She appears well-developed and well-nourished. No distress.  HENT:  Head: Normocephalic and atraumatic.  Eyes: EOM are normal.  Neck: Neck supple. No tracheal deviation present.  Cardiovascular: Normal rate.   Cap refill less < 3 seconds  Pulmonary/Chest: Effort normal. No respiratory distress.  Musculoskeletal: Normal range of motion.       Right elbow: Tenderness found. Lateral epicondyle tenderness noted.  Increased pain with supination of right hand with mild pain with ROM of right wrist.    Neurological: She is alert and oriented to person, place, and time.  Symmetrical grip strength   Skin: Skin is warm and dry. No rash noted.  No ecchymosis or lesions to right arm   Psychiatric: She has a normal mood and affect. Her behavior is normal.    ED Course  Procedures  (including critical care time) COORDINATION OF CARE:  Nursing notes reviewed. Vital signs reviewed. Initial pt interview and examination performed.   Filed Vitals:   11/25/13 0914 11/25/13 0918  BP: 175/120 168/113  Pulse: 82   Temp: 98.5 F (36.9 C)   TempSrc: Oral   Resp: 18   Height: 5\' 5"  (1.651 m)   Weight: 135 lb (61.236 kg)   SpO2: 100%    I have recommended to here a tennis elbow splint Will try oral round of prednisone since she has failed two weeks of taking Ibuprofen. I recommend she see the hand specialist for perhaps physical therapy or injections for pain/inflammation if conservative treatment does not work.  MDM   Final diagnoses:  Tennis elbow    43 y.o.[name]'s evaluation in the Emergency Department is complete. It has been determined that no acute conditions requiring further emergency intervention are present at this time. The patient/guardian have been advised of the diagnosis and plan. We have discussed signs and symptoms that warrant return to the ED, such as changes or worsening in symptoms.  Vital signs are stable at discharge. Filed Vitals:   11/25/13 0918  BP: 168/113  Pulse:   Temp:   Resp:     Patient/guardian has voiced understanding and agreed to follow-up with the PCP or specialist.  I personally performed the services described in this documentation, which was scribed in my presence. The recorded information has been reviewed and is accurate.    Linus Mako, PA-C 11/25/13 1913

## 2013-11-25 NOTE — ED Provider Notes (Signed)
Medical screening examination/treatment/procedure(s) were performed by non-physician practitioner and as supervising physician I was immediately available for consultation/collaboration.   EKG Interpretation None        Blanchard Kelch, MD 11/25/13 2134

## 2014-06-21 ENCOUNTER — Encounter (HOSPITAL_COMMUNITY): Payer: Self-pay | Admitting: Emergency Medicine

## 2014-11-17 ENCOUNTER — Emergency Department (HOSPITAL_COMMUNITY)
Admission: EM | Admit: 2014-11-17 | Discharge: 2014-11-17 | Disposition: A | Discharge status: Home / Self Care | Payer: Self-pay | Attending: Emergency Medicine | Admitting: Emergency Medicine

## 2014-11-17 ENCOUNTER — Encounter (HOSPITAL_COMMUNITY): Payer: Self-pay | Admitting: *Deleted

## 2014-11-17 DIAGNOSIS — M549 Dorsalgia, unspecified: Secondary | ICD-10-CM

## 2014-11-17 DIAGNOSIS — M546 Pain in thoracic spine: Secondary | ICD-10-CM | POA: Insufficient documentation

## 2014-11-17 DIAGNOSIS — M791 Myalgia: Secondary | ICD-10-CM | POA: Insufficient documentation

## 2014-11-17 DIAGNOSIS — Z720 Tobacco use: Secondary | ICD-10-CM | POA: Insufficient documentation

## 2014-11-17 DIAGNOSIS — Z7952 Long term (current) use of systemic steroids: Secondary | ICD-10-CM | POA: Insufficient documentation

## 2014-11-17 DIAGNOSIS — Z79899 Other long term (current) drug therapy: Secondary | ICD-10-CM | POA: Insufficient documentation

## 2014-11-17 DIAGNOSIS — I10 Essential (primary) hypertension: Secondary | ICD-10-CM | POA: Insufficient documentation

## 2014-11-17 DIAGNOSIS — Z8742 Personal history of other diseases of the female genital tract: Secondary | ICD-10-CM | POA: Insufficient documentation

## 2014-11-17 MED ORDER — METHOCARBAMOL 500 MG PO TABS
500.0000 mg | ORAL_TABLET | Freq: Once | ORAL | Status: AC
  Administered 2014-11-17: 500 mg via ORAL
  Filled 2014-11-17: qty 1

## 2014-11-17 MED ORDER — METHOCARBAMOL 500 MG PO TABS: 500.0000 mg | ORAL_TABLET | Freq: Two times a day (BID) | ORAL | Status: DC

## 2014-11-17 NOTE — Discharge Instructions (Signed)
Return to the Emergency Department immediately if you develop any pain in your chest or shortness of breath.

## 2014-11-17 NOTE — ED Provider Notes (Signed)
CSN: [id number]     Arrival date & time 11/17/14  2029 History  This chart was scribed for non-physician practitioner, Hyman Bible, PA-C, working with Orlie Dakin, MD, by Delphia Grates, ED Scribe. This patient was seen in room TR10C/TR10C and the patient's care was started at 9:02 PM.     Chief Complaint  Patient presents with  . Back Pain    The history is provided by the patient. No language interpreter was used.     HPI Comments: Becky Koch is a 44 y.o. female who presents to the Emergency Department complaining of sudden onset intermittent, moderate, upper back pain that started yesterday. Patient states she was lifting wet clothes and reports a muscle spasms of the upper back that later progressed to pain since last night. Patient suspects she may have pulled a muscle. She notes the pain is worse with certain movements, but notes relief when she stretches her arms. Patient has tried Vicodin with some temporary relief. She reports history of the same when she helped her husband move years ago that lasted for 2-3 weeks. Patient reports a recent change in her BP medication (from Losartan and HCTZ to just HCTZ) and suspects this may be related. She denies chest pain, SOB, numbness/tingling of the extremities.   Past Medical History  Diagnosis Date  . Fibroid, uterine   . Hypertension    Past Surgical History  Procedure Laterality Date  . Tubal ligation     No family history on file. History  Substance Use Topics  . Smoking status: Current Every Day Smoker -- 0.25 packs/day    Types: Cigarettes  . Smokeless tobacco: Not on file  . Alcohol Use: Yes     Comment: social alcohol   OB History    Gravida Para Term Preterm AB TAB SAB Ectopic Multiple Living   8 4 4  4 3 1   4      Review of Systems  Respiratory: Negative for shortness of breath.   Cardiovascular: Negative for chest pain.  Musculoskeletal: Positive for myalgias and back pain.  Neurological: Negative for  weakness and numbness.      Allergies  Review of patient's allergies indicates no known allergies.  Home Medications   Prior to Admission medications   Medication Sig Start Date End Date Taking? Authorizing Provider  esomeprazole (NEXIUM) 20 MG capsule Take 20 mg by mouth daily at 12 noon.    Historical Provider, MD  ferrous fumarate (HEMOCYTE - 106 MG FE) 325 (106 FE) MG TABS Take 1 tablet by mouth.    Historical Provider, MD  HYDROcodone-acetaminophen (NORCO/VICODIN) 5-325 MG per tablet Take 1-2 tablets by mouth every 4 (four) hours as needed. 11/25/13   Tiffany Carlota Raspberry, PA-C  ibuprofen (ADVIL,MOTRIN) 200 MG tablet Take 600 mg by mouth every 6 (six) hours as needed for mild pain.    Historical Provider, MD  levonorgestrel (MIRENA) 20 MCG/24HR IUD 1 Intra Uterine Device (1 each total) by Intrauterine route once. 01/01/13   Woodroe Mode, MD  losartan-hydrochlorothiazide (HYZAAR) 100-12.5 MG per tablet Take 1 tablet by mouth daily.    Historical Provider, MD  Multiple Vitamin (MULTIVITAMIN WITH MINERALS) TABS Take 1 tablet by mouth daily.    Historical Provider, MD  predniSONE (DELTASONE) 10 MG tablet Take 4 tablets (40 mg total) by mouth daily. 11/25/13   Delos Haring, PA-C   Triage Vitals: BP 162/94 mmHg  Pulse 101  Temp(Src) 98.2 F (36.8 C)  Resp 18  Ht 5\' 5"  (  1.651 m)  Wt 153 lb (69.4 kg)  BMI 25.46 kg/m2  SpO2 99%  LMP 11/03/2014  Physical Exam  Constitutional: She is oriented to person, place, and time. She appears well-developed and well-nourished. No distress.  HENT:  Head: Normocephalic and atraumatic.  Eyes: Conjunctivae and EOM are normal.  Neck: Neck supple. No tracheal deviation present.  Cardiovascular: Normal rate and regular rhythm.   Pulses:      Radial pulses are 2+ on the right side, and 2+ on the left side.  Pulmonary/Chest: Effort normal and breath sounds normal. No respiratory distress.  Musculoskeletal: Normal range of motion.  No tenderness to  palpation of the left upper back. 2+ radial pulses bilaterally.  Neurological: She is alert and oriented to person, place, and time.  Distal sensation of all fingers on both hands is intact.  Skin: Skin is warm and dry.  Psychiatric: She has a normal mood and affect. Her behavior is normal.  Nursing note and vitals reviewed.   ED Course  Procedures (including critical care time)  DIAGNOSTIC STUDIES: Oxygen Saturation is 99% on room air, normal by my interpretation.    COORDINATION OF CARE: At 2110 Discussed treatment plan with patient which includes muscle relaxer. Patient agrees.   Labs Review Labs Reviewed - No data to display  Imaging Review No results found.   EKG Interpretation None      MDM   Final diagnoses:  None   Patient presents today with left upper back pain.  Pain worse with movement.  Neurovascularly intact.  No CP or SOB.  VSS.  Patient stable for discharge.  Return precautions given.   Hyman Bible, PA-C 11/17/14 [place], MD 11/18/14 [phone]

## 2014-11-17 NOTE — ED Notes (Signed)
Pt states that she was hanging up clothes and started experiencing back pain. States that she believes she pulled a muscle. Has taken ibuprofen and a vicodin for the pain and has had some relief.

## 2014-11-20 ENCOUNTER — Encounter (HOSPITAL_COMMUNITY): Payer: Self-pay | Admitting: *Deleted

## 2014-11-20 ENCOUNTER — Emergency Department (HOSPITAL_COMMUNITY)
Admission: EM | Admit: 2014-11-20 | Discharge: 2014-11-20 | Disposition: A | Discharge status: Home / Self Care | Payer: Self-pay | Attending: Emergency Medicine | Admitting: Emergency Medicine

## 2014-11-20 DIAGNOSIS — Z8742 Personal history of other diseases of the female genital tract: Secondary | ICD-10-CM | POA: Insufficient documentation

## 2014-11-20 DIAGNOSIS — Z7952 Long term (current) use of systemic steroids: Secondary | ICD-10-CM | POA: Insufficient documentation

## 2014-11-20 DIAGNOSIS — Z79899 Other long term (current) drug therapy: Secondary | ICD-10-CM | POA: Insufficient documentation

## 2014-11-20 DIAGNOSIS — M546 Pain in thoracic spine: Secondary | ICD-10-CM | POA: Insufficient documentation

## 2014-11-20 DIAGNOSIS — Z720 Tobacco use: Secondary | ICD-10-CM | POA: Insufficient documentation

## 2014-11-20 DIAGNOSIS — I10 Essential (primary) hypertension: Secondary | ICD-10-CM | POA: Insufficient documentation

## 2014-11-20 MED ORDER — MELOXICAM 7.5 MG PO TABS: 7.5000 mg | ORAL_TABLET | Freq: Every day | ORAL | Status: DC

## 2014-11-20 MED ORDER — DIAZEPAM 5 MG PO TABS
5.0000 mg | ORAL_TABLET | Freq: Four times a day (QID) | ORAL | Status: DC | PRN
Start: 2014-11-20 — End: 2014-12-29

## 2014-11-20 NOTE — Discharge Instructions (Signed)
Read the information below.  Use the prescribed medication as directed.  Please discuss all new medications with your pharmacist.  You may return to the Emergency Department at any time for worsening condition or any new symptoms that concern you.   If you develop fevers, loss of control of bowel or bladder, weakness or numbness in your legs, or are unable to walk, return to the ER for a recheck.    Back Exercises These exercises may help you when beginning to rehabilitate your injury. Your symptoms may resolve with or without further involvement from your physician, physical therapist or athletic trainer. While completing these exercises, remember:   Restoring tissue flexibility helps normal motion to return to the joints. This allows healthier, less painful movement and activity.  An effective stretch should be held for at least 30 seconds.  A stretch should never be painful. You should only feel a gentle lengthening or release in the stretched tissue. STRETCH - Extension, Prone on Elbows   Lie on your stomach on the floor, a bed will be too soft. Place your palms about shoulder width apart and at the height of your head.  Place your elbows under your shoulders. If this is too painful, stack pillows under your chest.  Allow your body to relax so that your hips drop lower and make contact more completely with the floor.  Hold this position for __________ seconds.  Slowly return to lying flat on the floor. Repeat __________ times. Complete this exercise __________ times per day.  RANGE OF MOTION - Extension, Prone Press Ups   Lie on your stomach on the floor, a bed will be too soft. Place your palms about shoulder width apart and at the height of your head.  Keeping your back as relaxed as possible, slowly straighten your elbows while keeping your hips on the floor. You may adjust the placement of your hands to maximize your comfort. As you gain motion, your hands will come more underneath  your shoulders.  Hold this position __________ seconds.  Slowly return to lying flat on the floor. Repeat __________ times. Complete this exercise __________ times per day.  RANGE OF MOTION- Quadruped, Neutral Spine   Assume a hands and knees position on a firm surface. Keep your hands under your shoulders and your knees under your hips. You may place padding under your knees for comfort.  Drop your head and point your tail bone toward the ground below you. This will round out your low back like an angry cat. Hold this position for __________ seconds.  Slowly lift your head and release your tail bone so that your back sags into a large arch, like an old horse.  Hold this position for __________ seconds.  Repeat this until you feel limber in your low back.  Now, find your "sweet spot." This will be the most comfortable position somewhere between the two previous positions. This is your neutral spine. Once you have found this position, tense your stomach muscles to support your low back.  Hold this position for __________ seconds. Repeat __________ times. Complete this exercise __________ times per day.  STRETCH - Flexion, Single Knee to Chest   Lie on a firm bed or floor with both legs extended in front of you.  Keeping one leg in contact with the floor, bring your opposite knee to your chest. Hold your leg in place by either grabbing behind your thigh or at your knee.  Pull until you feel a gentle stretch  in your low back. Hold __________ seconds.  Slowly release your grasp and repeat the exercise with the opposite side. Repeat __________ times. Complete this exercise __________ times per day.  STRETCH - Hamstrings, Standing  Stand or sit and extend your right / left leg, placing your foot on a chair or foot stool  Keeping a slight arch in your low back and your hips straight forward.  Lead with your chest and lean forward at the waist until you feel a gentle stretch in the back  of your right / left knee or thigh. (When done correctly, this exercise requires leaning only a small distance.)  Hold this position for __________ seconds. Repeat __________ times. Complete this stretch __________ times per day. STRENGTHENING - Deep Abdominals, Pelvic Tilt   Lie on a firm bed or floor. Keeping your legs in front of you, bend your knees so they are both pointed toward the ceiling and your feet are flat on the floor.  Tense your lower abdominal muscles to press your low back into the floor. This motion will rotate your pelvis so that your tail bone is scooping upwards rather than pointing at your feet or into the floor.  With a gentle tension and even breathing, hold this position for __________ seconds. Repeat __________ times. Complete this exercise __________ times per day.  STRENGTHENING - Abdominals, Crunches   Lie on a firm bed or floor. Keeping your legs in front of you, bend your knees so they are both pointed toward the ceiling and your feet are flat on the floor. Cross your arms over your chest.  Slightly tip your chin down without bending your neck.  Tense your abdominals and slowly lift your trunk high enough to just clear your shoulder blades. Lifting higher can put excessive stress on the low back and does not further strengthen your abdominal muscles.  Control your return to the starting position. Repeat __________ times. Complete this exercise __________ times per day.  STRENGTHENING - Quadruped, Opposite UE/LE Lift   Assume a hands and knees position on a firm surface. Keep your hands under your shoulders and your knees under your hips. You may place padding under your knees for comfort.  Find your neutral spine and gently tense your abdominal muscles so that you can maintain this position. Your shoulders and hips should form a rectangle that is parallel with the floor and is not twisted.  Keeping your trunk steady, lift your right hand no higher than your  shoulder and then your left leg no higher than your hip. Make sure you are not holding your breath. Hold this position __________ seconds.  Continuing to keep your abdominal muscles tense and your back steady, slowly return to your starting position. Repeat with the opposite arm and leg. Repeat __________ times. Complete this exercise __________ times per day. Document Released: 08/24/2005 Document Revised: 10/29/2011 Document Reviewed: 11/18/2008 [facility] Patient Information 2015 [place], [place]. This information is not intended to replace advice given to you by your health care provider. Make sure you discuss any questions you have with your health care provider.  Back Pain, Adult Low back pain is very common. About 1 in 5 people have back pain.The cause of low back pain is rarely dangerous. The pain often gets better over time.About half of people with a sudden onset of back pain feel better in just 2 weeks. About 8 in 10 people feel better by 6 weeks.  CAUSES Some common causes of back pain include:  Strain of the muscles or ligaments supporting the spine.  Wear and tear (degeneration) of the spinal discs.  Arthritis.  Direct injury to the back. DIAGNOSIS Most of the time, the direct cause of low back pain is not known.However, back pain can be treated effectively even when the exact cause of the pain is unknown.Answering your caregiver's questions about your overall health and symptoms is one of the most accurate ways to make sure the cause of your pain is not dangerous. If your caregiver needs more information, he or she may order lab work or imaging tests (X-rays or MRIs).However, even if imaging tests show changes in your back, this usually does not require surgery. HOME CARE INSTRUCTIONS For many people, back pain returns.Since low back pain is rarely dangerous, it is often a condition that people can learn to [facility] their own.   Remain active. It is stressful on the back to  sit or stand in one place. Do not sit, drive, or stand in one place for more than 30 minutes at a time. Take short walks on level surfaces as soon as pain allows.Try to increase the length of time you walk each day.  Do not stay in bed.Resting more than 1 or 2 days can delay your recovery.  Do not avoid exercise or work.Your body is made to move.It is not dangerous to be active, even though your back may hurt.Your back will likely heal faster if you return to being active before your pain is gone.  Pay attention to your body when you bend and lift. Many people have less discomfortwhen lifting if they bend their knees, keep the load close to their bodies,and avoid twisting. Often, the most comfortable positions are those that put less stress on your recovering back.  Find a comfortable position to sleep. Use a firm mattress and lie on your side with your knees slightly bent. If you lie on your back, put a pillow under your knees.  Only take over-the-counter or prescription medicines as directed by your caregiver. Over-the-counter medicines to reduce pain and inflammation are often the most helpful.Your caregiver may prescribe muscle relaxant drugs.These medicines help dull your pain so you can more quickly return to your normal activities and healthy exercise.  Put ice on the injured area.  Put ice in a plastic bag.  Place a towel between your skin and the bag.  Leave the ice on for 15-20 minutes, 03-04 times a day for the first 2 to 3 days. After that, ice and heat may be alternated to reduce pain and spasms.  Ask your caregiver about trying back exercises and gentle massage. This may be of some benefit.  Avoid feeling anxious or stressed.Stress increases muscle tension and can worsen back pain.It is important to recognize when you are anxious or stressed and learn ways to manage it.Exercise is a great option. SEEK MEDICAL CARE IF:  You have pain that is not relieved with rest  or medicine.  You have pain that does not improve in 1 week.  You have new symptoms.  You are generally not feeling well. SEEK IMMEDIATE MEDICAL CARE IF:   You have pain that radiates from your back into your legs.  You develop new bowel or bladder control problems.  You have unusual weakness or numbness in your arms or legs.  You develop nausea or vomiting.  You develop abdominal pain.  You feel faint. Document Released: 08/06/2005 Document Revised: 02/05/2012 Document Reviewed: 12/08/2013 [employer] Patient Information 2015 [place]  [employer]. This information is not intended to replace advice given to you by your health care provider. Make sure you discuss any questions you have with your health care provider.

## 2014-11-20 NOTE — ED Provider Notes (Signed)
CSN: [id number]     Arrival date & time 11/20/14  1425 History  This chart is scribed for non-physician practitioner, Clayton Bibles, PA-C, working with Debby Freiberg, MD by Chester Holstein, ED Scribe.  This patient was seen in room TR10C/TR10C and the patient's care was started 3:51 PM.     Chief Complaint  Patient presents with  . Back Pain     Patient is a 44 y.o. female presenting with back pain. The history is provided by the patient. No language interpreter was used.  Back Pain Associated symptoms: no chest pain, no fever, no numbness and no weakness    HPI Comments: Becky Koch is a 44 y.o. female who presents to the Emergency Department complaining of   intermittent left mid back pain and spasms with onset 5 days ago. The pain is random, intermittent, lasts for up to a minute at a time.  The pain can sometimes happen along her right shoulder and right upper back all the way across her left upper back.  The primary pain is below her left shoulder blade. Pt was last seen in ED 3 days ago for same and prescribed Flexeril which she states is no longer providing relief. Pt has taken ibuprofen for relief. Pt states putting pressure on her chest wall which alleviates the pain. Pt denies changes of pain with movement. Pt denies any other injury, SOB, chest pain, weakness or numbness, fever, bowel or urinary incontinence, and h/o CA or IVDA.   Past Medical History  Diagnosis Date  . Fibroid, uterine   . Hypertension    Past Surgical History  Procedure Laterality Date  . Tubal ligation     History reviewed. No pertinent family history. History  Substance Use Topics  . Smoking status: Current Every Day Smoker -- 0.25 packs/day    Types: Cigarettes  . Smokeless tobacco: Not on file  . Alcohol Use: Yes     Comment: social alcohol   OB History    Gravida Para Term Preterm AB TAB SAB Ectopic Multiple Living   8 4 4  4 3 1   4      Review of Systems  Constitutional: Negative for fever  and chills.  Respiratory: Negative for cough and shortness of breath.   Cardiovascular: Negative for chest pain.  Musculoskeletal: Positive for back pain. Negative for neck pain and neck stiffness.  Skin: Negative for color change and rash.  Allergic/Immunologic: Negative for immunocompromised state.  Neurological: Negative for weakness and numbness.  Hematological: Does not bruise/bleed easily.  Psychiatric/Behavioral: Negative for self-injury.      Allergies  Review of patient's allergies indicates no known allergies.  Home Medications   Prior to Admission medications   Medication Sig Start Date End Date Taking? Authorizing Provider  diazepam (VALIUM) 5 MG tablet Take 1 tablet (5 mg total) by mouth every 6 (six) hours as needed for muscle spasms. 11/20/14   Clayton Bibles, PA-C  esomeprazole (NEXIUM) 20 MG capsule Take 20 mg by mouth daily at 12 noon.    Historical Provider, MD  ferrous fumarate (HEMOCYTE - 106 MG FE) 325 (106 FE) MG TABS Take 1 tablet by mouth.    Historical Provider, MD  HYDROcodone-acetaminophen (NORCO/VICODIN) 5-325 MG per tablet Take 1-2 tablets by mouth every 4 (four) hours as needed. 11/25/13   Delos Haring, PA-C  levonorgestrel (MIRENA) 20 MCG/24HR IUD 1 Intra Uterine Device (1 each total) by Intrauterine route once. 01/01/13   Woodroe Mode, MD  losartan-hydrochlorothiazide [facility])  100-12.5 MG per tablet Take 1 tablet by mouth daily.    Historical Provider, MD  meloxicam (MOBIC) 7.5 MG tablet Take 1 tablet (7.5 mg total) by mouth daily. 11/20/14   Clayton Bibles, PA-C  methocarbamol (ROBAXIN) 500 MG tablet Take 1 tablet (500 mg total) by mouth 2 (two) times daily. 11/17/14   Hyman Bible, PA-C  Multiple Vitamin (MULTIVITAMIN WITH MINERALS) TABS Take 1 tablet by mouth daily.    Historical Provider, MD  predniSONE (DELTASONE) 10 MG tablet Take 4 tablets (40 mg total) by mouth daily. 11/25/13   Tiffany Carlota Raspberry, PA-C   BP 136/87 mmHg  Pulse 91  Temp(Src) 99 F (37.2 C)  (Oral)  Resp 18  Ht 5' 5.5" (1.664 m)  Wt 150 lb (68.04 kg)  BMI 24.57 kg/m2  SpO2 98%  LMP 11/03/2014 Physical Exam  Constitutional: She is oriented to person, place, and time. She appears well-developed and well-nourished. No distress.  HENT:  Head: Normocephalic and atraumatic.  Neck: Neck supple.  Cardiovascular: Normal rate, regular rhythm and normal heart sounds.  Exam reveals no friction rub.   No murmur heard. Pulmonary/Chest: Effort normal and breath sounds normal. No respiratory distress. She has no wheezes. She has no rales.  Abdominal: Soft. She exhibits no distension and no mass. There is no tenderness. There is no rebound and no guarding.  Musculoskeletal:  Upper extremities:  Strength 5/5, sensation intact, distal pulses intact.    Tightness in left lower trapezius and musculature of left back; no skin changes or TTP  Neurological: She is alert and oriented to person, place, and time.  Skin: She is not diaphoretic.  Nursing note and vitals reviewed.   ED Course  Procedures (including critical care time) DIAGNOSTIC STUDIES: Oxygen Saturation is 98% on room air, normal by my interpretation.    COORDINATION OF CARE: 3:58 PM Discussed treatment plan with patient at beside, the patient agrees with the plan and has no further questions at this time.   Labs Review Labs Reviewed - No data to display  Imaging Review No results found.   EKG Interpretation None      MDM   Final diagnoses:  Left-sided thoracic back pain   Afebrile, nontoxic patient with mechanical low back pain. No red flags.  No chest pain or SOB.  The pain is very atypical, suspect muscle spasms.  D/C home with valium, mobic, [facility] referral for close follow up.  Discussed result, findings, treatment, and follow up  with patient.  Pt given return precautions.  Pt verbalizes understanding and agrees with plan.      I personally performed the services described in this documentation, which  was scribed in my presence. The recorded information has been reviewed and is accurate.    Clayton Bibles, PA-C 11/20/14 1645  Debby Freiberg, MD 11/23/14 [phone]

## 2014-11-20 NOTE — ED Notes (Signed)
Declined W/C at D/C and was escorted to lobby by RN. 

## 2014-11-20 NOTE — ED Notes (Signed)
Pt was seen here on 3/30 for same, having left side upper back pain/shoulder pain. Was given flexeril but no relief. No acute distress noted at triage.

## 2014-12-29 ENCOUNTER — Emergency Department (HOSPITAL_COMMUNITY)
Admission: EM | Admit: 2014-12-29 | Discharge: 2014-12-29 | Disposition: A | Discharge status: Home / Self Care | Payer: Self-pay | Attending: Emergency Medicine | Admitting: Emergency Medicine

## 2014-12-29 ENCOUNTER — Encounter (HOSPITAL_COMMUNITY): Payer: Self-pay | Admitting: Family Medicine

## 2014-12-29 DIAGNOSIS — I10 Essential (primary) hypertension: Secondary | ICD-10-CM | POA: Insufficient documentation

## 2014-12-29 DIAGNOSIS — Z8541 Personal history of malignant neoplasm of cervix uteri: Secondary | ICD-10-CM | POA: Insufficient documentation

## 2014-12-29 DIAGNOSIS — M542 Cervicalgia: Secondary | ICD-10-CM | POA: Insufficient documentation

## 2014-12-29 DIAGNOSIS — Z720 Tobacco use: Secondary | ICD-10-CM | POA: Insufficient documentation

## 2014-12-29 DIAGNOSIS — Z79899 Other long term (current) drug therapy: Secondary | ICD-10-CM | POA: Insufficient documentation

## 2014-12-29 MED ORDER — IBUPROFEN 200 MG PO TABS
600.0000 mg | ORAL_TABLET | Freq: Once | ORAL | Status: AC
  Administered 2014-12-29: 600 mg via ORAL
  Filled 2014-12-29: qty 3

## 2014-12-29 MED ORDER — METHOCARBAMOL 500 MG PO TABS: 1000.0000 mg | ORAL_TABLET | Freq: Four times a day (QID) | ORAL | Status: DC

## 2014-12-29 MED ORDER — NAPROXEN 500 MG PO TABS: 500.0000 mg | ORAL_TABLET | Freq: Two times a day (BID) | ORAL | Status: DC

## 2014-12-29 NOTE — ED Provider Notes (Signed)
CSN: [id number]     Arrival date & time 12/29/14  0946 History   First MD Initiated Contact with Patient 12/29/14 1002     Chief Complaint  Patient presents with  . Neck Pain  . Hypertension     (Consider location/radiation/quality/duration/timing/severity/associated sxs/prior Treatment) HPI Comments: Patient presents with complaint of posterior neck pain starting this morning that is worse with movement especially when she extends her neck to look upwards. It also hurts when she looks side to side but to a lesser amount. No treatments PTA. Patient was recently seen for back pain but this is improved. Patient notes that she had leg last evening with this has since resolved. No weakness, numbness, or tingling in arms or legs with this. No chest pains or shortness of breath. No fever. No recent manipulations of neck or trauma. Patient is concerned that she is having a stroke. Patient denies signs of stroke including: facial droop, slurred speech, aphasia, weakness/numbness in extremities, imbalance/trouble walking. Patient denies warning symptoms of back pain including: fecal incontinence, urinary retention or overflow incontinence, night sweats, waking from sleep with back pain, unexplained fevers or weight loss, h/o cancer, IVDU, recent trauma.      Patient is a 44 y.o. female presenting with neck pain and hypertension. The history is provided by the patient and medical records.  Neck Pain Associated symptoms: no fever, no numbness and no weakness   Hypertension Associated symptoms include neck pain. Pertinent negatives include no fever, numbness or weakness.    Past Medical History  Diagnosis Date  . Fibroid, uterine   . Hypertension    Past Surgical History  Procedure Laterality Date  . Tubal ligation     History reviewed. No pertinent family history. History  Substance Use Topics  . Smoking status: Current Every Day Smoker -- 0.25 packs/day    Types: Cigarettes  . Smokeless  tobacco: Not on file  . Alcohol Use: Yes     Comment: social alcohol   OB History    Gravida Para Term Preterm AB TAB SAB Ectopic Multiple Living   8 4 4  4 3 1   4      Review of Systems  Constitutional: Negative for fever and unexpected weight change.  Eyes: Negative for visual disturbance.  Gastrointestinal: Negative for constipation.       Negative for fecal incontinence.   Genitourinary: Negative for dysuria, hematuria, flank pain, vaginal bleeding, vaginal discharge and pelvic pain.       Negative for urinary incontinence or retention.  Musculoskeletal: Positive for neck pain. Negative for back pain.  Neurological: Negative for weakness and numbness.       Denies saddle paresthesias.      Allergies  Review of patient's allergies indicates no known allergies.  Home Medications   Prior to Admission medications   Medication Sig Start Date End Date Taking? Authorizing Provider  levonorgestrel (MIRENA) 20 MCG/24HR IUD 1 Intra Uterine Device (1 each total) by Intrauterine route once. 01/01/13  Yes Woodroe Mode, MD  losartan-hydrochlorothiazide (HYZAAR) 100-12.5 MG per tablet Take 1 tablet by mouth daily.   Yes Historical Provider, MD  Multiple Vitamin (MULTIVITAMIN WITH MINERALS) TABS Take 1 tablet by mouth daily.   Yes Historical Provider, MD  diazepam (VALIUM) 5 MG tablet Take 1 tablet (5 mg total) by mouth every 6 (six) hours as needed for muscle spasms. Patient not taking: Reported on 12/29/2014 11/20/14   Clayton Bibles, PA-C  HYDROcodone-acetaminophen (NORCO/VICODIN) 5-325 MG per tablet Take 1-2  tablets by mouth every 4 (four) hours as needed. Patient not taking: Reported on 12/29/2014 11/25/13   Delos Haring, PA-C  meloxicam (MOBIC) 7.5 MG tablet Take 1 tablet (7.5 mg total) by mouth daily. Patient not taking: Reported on 12/29/2014 11/20/14   Clayton Bibles, PA-C  methocarbamol (ROBAXIN) 500 MG tablet Take 1 tablet (500 mg total) by mouth 2 (two) times daily. Patient not taking:  Reported on 12/29/2014 11/17/14   Hyman Bible, PA-C  predniSONE (DELTASONE) 10 MG tablet Take 4 tablets (40 mg total) by mouth daily. Patient not taking: Reported on 12/29/2014 11/25/13   Delos Haring, PA-C   BP 142/94 mmHg  Pulse 88  Temp(Src) 98 F (36.7 C) (Oral)  Resp 18  Ht 5' 5.5" (1.664 m)  Wt 155 lb (70.308 kg)  BMI 25.39 kg/m2  SpO2 100%  LMP 11/27/2014 Physical Exam  Constitutional: She appears well-developed and well-nourished.  HENT:  Head: Normocephalic and atraumatic.  Eyes: Conjunctivae are normal.  Neck: Trachea normal. Neck supple. Muscular tenderness present. No spinous process tenderness present. Carotid bruit is not present. No rigidity. Decreased range of motion present. No edema present.  Cardiovascular: Normal rate and regular rhythm.   No murmur heard. Pulmonary/Chest: Effort normal. No respiratory distress. She has no wheezes. She has no rales.  Abdominal: Soft. There is no tenderness. There is no CVA tenderness.  Musculoskeletal:       Back:  No step-off noted with palpation of spine.   Neurological: She is alert. She has normal strength and normal reflexes. No sensory deficit.  5/5 strength in entire upper and lower extremities bilaterally. No sensation deficit.   Skin: Skin is warm and dry. No rash noted.  Psychiatric: She has a normal mood and affect.  Nursing note and vitals reviewed.   ED Course  Procedures (including critical care time) Labs Review Labs Reviewed - No data to display  Imaging Review No results found.   EKG Interpretation None       10:59 AM Patient seen and examined. Medications ordered.   Vital signs reviewed and are as follows: BP 142/94 mmHg  Pulse 88  Temp(Src) 98 F (36.7 C) (Oral)  Resp 18  Ht 5' 5.5" (1.664 m)  Wt 155 lb (70.308 kg)  BMI 25.39 kg/m2  SpO2 100%  LMP 11/27/2014  Will treat as MSK neck pain. No red flag s/s of low back pain. Patient was counseled on back pain precautions and told to do  activity as tolerated but do not lift, push, or pull heavy objects more than 10 pounds for the next week.  Patient counseled to use ice or heat on back for no longer than 15 minutes every hour.   Patient prescribed muscle relaxer and counseled on proper use of muscle relaxant medication.    Urged patient not to drink alcohol, drive, or perform any other activities that requires focus while taking this medications.  Patient urged to follow-up with PCP if pain does not improve with treatment and rest or if pain becomes recurrent. Urged to return with worsening severe pain, loss of bowel or bladder control, trouble walking.   The patient verbalizes understanding and agrees with the plan.   MDM   Final diagnoses:  Musculoskeletal neck pain   Patient with reproducible, positional neck pain. Patient had some radiation into her arm and leg last night when she didn't have neck pain. She denies any weakness at that time. Patient has no radicular features today at this time. No warning  signs of neck or back pain. Patient relates her symptoms to her blood pressure however it is not extremely high in emergency department. Suspect that this is MSK reproduced on position and palpation patient has musculoskeletal pain. No risk factors or signs suggestive of artery dissection in neck. No concern for meningitis. Patient has had no chest pain or shortness of breath and this is not likely to be an anginal equivalent. Feel d/c to home with symptomatic treatment is indicated at this time.   No dangerous or life-threatening conditions suspected or identified by history, physical exam, and by work-up. No indications for hospitalization identified.      Carlisle Cater, PA-C 12/29/14 [place], DO 12/29/14 1445

## 2014-12-29 NOTE — ED Notes (Signed)
Pt here for severe neck pain and left leg and arm pain. sts throbbing. sts does this when her BP is up.

## 2014-12-29 NOTE — Discharge Instructions (Signed)
Please read and follow all provided instructions.  Your diagnoses today include:  1. Musculoskeletal neck pain     Tests performed today include:  Vital signs - see below for your results today  Medications prescribed:   Robaxin (methocarbamol) - muscle relaxer medication  DO NOT drive or perform any activities that require you to be awake and alert because this medicine can make you drowsy.    Naproxen - anti-inflammatory pain medication  Do not exceed 500mg  naproxen every 12 hours, take with food  You have been prescribed an anti-inflammatory medication or NSAID. Take with food. Take smallest effective dose for the shortest duration needed for your pain. Stop taking if you experience stomach pain or vomiting.   Take any prescribed medications only as directed.  Home care instructions:   Follow any educational materials contained in this packet  Please rest, use ice or heat on your back for the next several days  Do not lift, push, pull anything more than 10 pounds for the next week  Follow-up instructions: Please follow-up with your primary care provider in the next 1 week for further evaluation of your symptoms.   Return instructions:  SEEK IMMEDIATE MEDICAL ATTENTION IF YOU HAVE:  New numbness, tingling, weakness, or problem with the use of your arms or legs  Severe back pain not relieved with medications  Loss control of your bowels or bladder  Increasing pain in any areas of the body (such as chest or abdominal pain)  Shortness of breath, dizziness, or fainting.   Worsening nausea (feeling sick to your stomach), vomiting, fever, or sweats  Any other emergent concerns regarding your health   Additional Information:  Your vital signs today were: BP 142/94 mmHg   Pulse 88   Temp(Src) 98 F (36.7 C) (Oral)   Resp 18   Ht 5' 5.5" (1.664 m)   Wt 155 lb (70.308 kg)   BMI 25.39 kg/m2   SpO2 100%   LMP 11/27/2014 If your blood pressure (BP) was elevated above  135/85 this visit, please have this repeated by your doctor within one month. --------------

## 2015-06-22 ENCOUNTER — Encounter (HOSPITAL_COMMUNITY): Payer: Self-pay

## 2015-06-22 ENCOUNTER — Emergency Department (HOSPITAL_COMMUNITY)
Admission: EM | Admit: 2015-06-22 | Discharge: 2015-06-22 | Disposition: A | Discharge status: Home / Self Care | Payer: Managed Care, Other (non HMO) | Attending: Emergency Medicine | Admitting: Emergency Medicine

## 2015-06-22 DIAGNOSIS — Z720 Tobacco use: Secondary | ICD-10-CM | POA: Diagnosis not present

## 2015-06-22 DIAGNOSIS — I10 Essential (primary) hypertension: Secondary | ICD-10-CM | POA: Diagnosis not present

## 2015-06-22 DIAGNOSIS — Z86018 Personal history of other benign neoplasm: Secondary | ICD-10-CM | POA: Insufficient documentation

## 2015-06-22 DIAGNOSIS — Z79899 Other long term (current) drug therapy: Secondary | ICD-10-CM | POA: Diagnosis not present

## 2015-06-22 DIAGNOSIS — M549 Dorsalgia, unspecified: Secondary | ICD-10-CM

## 2015-06-22 DIAGNOSIS — M546 Pain in thoracic spine: Secondary | ICD-10-CM | POA: Insufficient documentation

## 2015-06-22 MED ORDER — DIAZEPAM 5 MG PO TABS: 5.0000 mg | ORAL_TABLET | Freq: Two times a day (BID) | ORAL | Status: DC

## 2015-06-22 MED ORDER — MELOXICAM 7.5 MG PO TABS: 7.5000 mg | ORAL_TABLET | Freq: Every day | ORAL | Status: DC

## 2015-06-22 MED ORDER — OXYCODONE-ACETAMINOPHEN 5-325 MG PO TABS
1.0000 | ORAL_TABLET | Freq: Once | ORAL | Status: AC
  Administered 2015-06-22: 1 via ORAL
  Filled 2015-06-22: qty 1

## 2015-06-22 NOTE — ED Provider Notes (Signed)
CSN: [id number]     Arrival date & time 06/22/15  0051 History   By signing my name below, I, Forrestine Him, attest that this documentation has been prepared under the direction and in the presence of Alfonzo Beers, MD.  Electronically Signed: Forrestine Him, ED Scribe. 06/22/2015. 2:39 AM.   Chief Complaint  Patient presents with  . Back Pain   The history is provided by the patient. No language interpreter was used.    HPI Comments: Becky Koch is a 44 y.o. female with a PMHx of HTN who presents to the Emergency Department complaining of constant, ongoing mid upper back pain x 1 week. Pain is described as throbbing. No aggravating or alleviating factors at this time. Prescribed Robaxin attempted at home without any improvement. No recent fever, chills, nausea, vomiting, or chest pain. No numbness, loss of sensation, or weakness. Ms. Basista admits to a history of same 3 months ago. However, she denies any recent follow up with her PCP for this complaint. There are no other associated systemic symptoms, there are no other alleviating or modifying factors.   PCP: Default, Provider, MD    Past Medical History  Diagnosis Date  . Fibroid, uterine   . Hypertension    Past Surgical History  Procedure Laterality Date  . Tubal ligation     No family history on file. Social History  Substance Use Topics  . Smoking status: Current Every Day Smoker -- 0.25 packs/day    Types: Cigarettes  . Smokeless tobacco: None  . Alcohol Use: Yes     Comment: social alcohol   OB History    Gravida Para Term Preterm AB TAB SAB Ectopic Multiple Living   8 4 4  4 3 1   4      Review of Systems  Constitutional: Negative for fever and chills.  Respiratory: Negative for cough and shortness of breath.   Cardiovascular: Negative for chest pain.  Gastrointestinal: Negative for nausea, vomiting and abdominal pain.  Musculoskeletal: Positive for back pain.  Skin: Negative for rash.  Neurological: Negative  for weakness and numbness.  Psychiatric/Behavioral: Negative for confusion.  All other systems reviewed and are negative.     Allergies  Review of patient's allergies indicates no known allergies.  Home Medications   Prior to Admission medications   Medication Sig Start Date End Date Taking? Authorizing Provider  diazepam (VALIUM) 5 MG tablet Take 1 tablet (5 mg total) by mouth 2 (two) times daily. 06/22/15   Alfonzo Beers, MD  levonorgestrel (MIRENA) 20 MCG/24HR IUD 1 Intra Uterine Device (1 each total) by Intrauterine route once. 01/01/13   Woodroe Mode, MD  losartan-hydrochlorothiazide (HYZAAR) 100-12.5 MG per tablet Take 1 tablet by mouth daily.    Historical Provider, MD  meloxicam (MOBIC) 7.5 MG tablet Take 1 tablet (7.5 mg total) by mouth daily. 06/22/15   Alfonzo Beers, MD  methocarbamol (ROBAXIN) 500 MG tablet Take 2 tablets (1,000 mg total) by mouth 4 (four) times daily. 12/29/14   Carlisle Cater, PA-C  Multiple Vitamin (MULTIVITAMIN WITH MINERALS) TABS Take 1 tablet by mouth daily.    Historical Provider, MD  naproxen (NAPROSYN) 500 MG tablet Take 1 tablet (500 mg total) by mouth 2 (two) times daily. 12/29/14   Carlisle Cater, PA-C   Triage Vitals: BP 133/83 mmHg  Pulse 82  Temp(Src) 97.5 F (36.4 C) (Oral)  Resp 16  SpO2 99%  LMP 06/15/2015  Vitals reviewed Physical Exam  Physical Examination: General appearance -  alert, well appearing, and in no distress Mental status - alert, oriented to person, place, and time Eyes -  No conjunctival injection, no scleral icterus Neck - supple, no significant adenopathy Chest - clear to auscultation, no wheezes, rales or rhonchi, symmetric air entry Heart - normal rate, regular rhythm, normal S1, S2, no murmurs, rubs, clicks or gallops Back exam - no midline tenderness to palpation, ttp to paraspinous muscles bilaterally in thoracic region Neurological - alert, oriented, normal speech, strength 5/5 in extremities x 4, sensation  intact Musculoskeletal - no joint tenderness, deformity or swelling Extremities - peripheral pulses normal, no pedal edema, no clubbing or cyanosis Skin - normal coloration and turgor, no rashes  ED Course  Procedures (including critical care time)  DIAGNOSTIC STUDIES: Oxygen Saturation is 96% on RA, adequate by my interpretation.    COORDINATION OF CARE: 2:34 AM- Will give Percocet. Discussed treatment plan with pt at bedside and pt agreed to plan.     Labs Review Labs Reviewed - No data to display  Imaging Review No results found. I have personally reviewed and evaluated these images and lab results as part of my medical decision-making.   EKG Interpretation None      MDM   Final diagnoses:  Upper back pain    Pt presenting with c/o bilateral upper back pain, worse with movements and palpation.  Exam most c/w musculokeletal pain.  Pt given rx for valium for muscle spasm and antiinflammatory.  Discharged with strict return precautions.  Pt agreeable with plan.  I personally performed the services described in this documentation, which was scribed in my presence. The recorded information has been reviewed and is accurate.    Alfonzo Beers, MD 06/22/15 [phone]

## 2015-06-22 NOTE — ED Notes (Addendum)
Pt reports mid upper back pain onset last week. She states she was seen here in the past for same issue about 3 months ago. Pt ambulatory with steady gait, NAD. Denies injury.

## 2015-06-22 NOTE — Discharge Instructions (Signed)
Return to the ED with any concerns including difficulty breathing, chest pain, weakness of arms, fainting, decreased level of alertness/lethargy, or any other alarming symptoms

## 2016-06-04 ENCOUNTER — Ambulatory Visit (HOSPITAL_COMMUNITY)
Admission: EM | Admit: 2016-06-04 | Discharge: 2016-06-04 | Disposition: A | Discharge status: Home / Self Care | Payer: BLUE CROSS/BLUE SHIELD | Attending: Family Medicine | Admitting: Family Medicine

## 2016-06-04 ENCOUNTER — Encounter (HOSPITAL_COMMUNITY): Payer: Self-pay | Admitting: Emergency Medicine

## 2016-06-04 DIAGNOSIS — I10 Essential (primary) hypertension: Secondary | ICD-10-CM | POA: Diagnosis not present

## 2016-06-04 DIAGNOSIS — Z760 Encounter for issue of repeat prescription: Secondary | ICD-10-CM

## 2016-06-04 MED ORDER — AMLODIPINE BESYLATE 5 MG PO TABS: 5.0000 mg | ORAL_TABLET | Freq: Every day | ORAL | 3 refills | Status: DC

## 2016-06-04 MED ORDER — MIRTAZAPINE 15 MG PO TABS: 15.0000 mg | ORAL_TABLET | Freq: Every day | ORAL | 3 refills | Status: DC

## 2016-06-04 MED ORDER — HYDROCHLOROTHIAZIDE 12.5 MG PO CAPS: 12.5000 mg | ORAL_CAPSULE | Freq: Every day | ORAL | 3 refills | Status: DC

## 2016-06-04 NOTE — ED Provider Notes (Addendum)
[facility]    CSN: [id number] Arrival date & time: 06/04/16  [id number]     History   Chief Complaint Chief Complaint  Patient presents with  . Medication Refill    HPI Becky Koch is a 45 y.o. female.   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 She's had high blood pressure for 20 years and she's here for refill on her medications. She's been noticing that her blood pressure has been high lately. She initially was on HCTZ but was switched to amlodipine after the losartan and HCTZ failed to work.  She's been under more stress lately because her father died and that her brother died suddenly from a heart attack. She's requesting a refill on her Remeron. She has no suicidal ideation.  Patient is married and she is a smoker but she is trying to quit. She recently bought her first house. She has children and grand children  Patient has an appointment at a local clinic for follow-up.      Past Medical History:  Diagnosis Date  . Fibroid, uterine   . Hypertension     Patient Active Problem List   Diagnosis Date Noted  . Dysmenorrhea 07/14/2012  . Menorrhagia 07/14/2012  . History of uterine fibroid 07/14/2012  . HTN (hypertension) 07/14/2012    Past Surgical History:  Procedure Laterality Date  . TUBAL LIGATION      OB History    Gravida Para Term Preterm AB Living   8 4 4   4 4    SAB TAB Ectopic Multiple Live Births   1 3             Home Medications    Prior to Admission medications   Medication Sig Start Date End Date Taking? Authorizing Provider  amLODipine (NORVASC) 5 MG tablet Take 1 tablet (5 mg total) by mouth daily. 06/04/16   Robyn Haber, MD  diazepam (VALIUM) 5 MG tablet Take 1 tablet (5 mg total) by mouth 2 (two) times daily. 06/22/15   Alfonzo Beers, MD  hydrochlorothiazide (MICROZIDE) 12.5 MG capsule Take 1 capsule (12.5 mg total) by mouth daily. 06/04/16   Robyn Haber, MD  levonorgestrel (MIRENA) 20 MCG/24HR  IUD 1 Intra Uterine Device (1 each total) by Intrauterine route once. 01/01/13   Woodroe Mode, MD  meloxicam (MOBIC) 7.5 MG tablet Take 1 tablet (7.5 mg total) by mouth daily. 06/22/15   Alfonzo Beers, MD  methocarbamol (ROBAXIN) 500 MG tablet Take 2 tablets (1,000 mg total) by mouth 4 (four) times daily. 12/29/14   Carlisle Cater, PA-C  mirtazapine (REMERON) 15 MG tablet Take 1 tablet (15 mg total) by mouth at bedtime. 06/04/16   Robyn Haber, MD  Multiple Vitamin (MULTIVITAMIN WITH MINERALS) TABS Take 1 tablet by mouth daily.    Historical Provider, MD  naproxen (NAPROSYN) 500 MG tablet Take 1 tablet (500 mg total) by mouth 2 (two) times daily. 12/29/14   Carlisle Cater, PA-C    Family History History reviewed. No pertinent family history.  Social History Social History  Substance Use Topics  . Smoking status: Current Every Day Smoker    Packs/day: 0.25    Types: Cigarettes  . Smokeless tobacco: Current User  . Alcohol use Yes     Comment: social alcohol     Allergies   Review of patient's allergies indicates no known allergies.   Review of Systems Review of Systems  Constitutional: Negative.   HENT: Negative.   Eyes: Negative.  Respiratory: Negative.   Cardiovascular: Negative.   Gastrointestinal: Negative.   Musculoskeletal: Positive for back pain.     Physical Exam Triage Vital Signs ED Triage Vitals  Enc Vitals Group     BP      Pulse      Resp      Temp      Temp src      SpO2      Weight      Height      Head Circumference      Peak Flow      Pain Score      Pain Loc      Pain Edu?      Excl. in [place]?    No data found.   Updated Vital Signs BP 151/97 (BP Location: Left Arm)   Pulse 98   Temp 98 F (36.7 C) (Oral)   Resp 12   SpO2 100%       Physical Exam  Constitutional: She is oriented to person, place, and time. She appears well-developed and well-nourished.  HENT:  Head: Normocephalic.  Right Ear: External ear normal.  Left Ear:  External ear normal.  Mouth/Throat: Oropharynx is clear and moist.  Eyes: Conjunctivae and EOM are normal. Pupils are equal, round, and reactive to light.  Neck: Normal range of motion. Neck supple.  Cardiovascular: Normal rate, regular rhythm and normal heart sounds.   Pulmonary/Chest: Effort normal and breath sounds normal.  Abdominal: Soft. There is no tenderness.  Neurological: She is alert and oriented to person, place, and time.  Psychiatric: She has a normal mood and affect.  Nursing note and vitals reviewed.    UC Treatments / Results  Labs (all labs ordered are listed, but only abnormal results are displayed) Labs Reviewed - No data to display  EKG  EKG Interpretation None       Radiology No results found.  Procedures Procedures (including critical care time)  Medications Ordered in UC Medications - No data to display   Initial Impression / Assessment and Plan / UC Course  I have reviewed the triage vital signs and the nursing notes.  Pertinent labs & imaging results that were available during my care of the patient were reviewed by me and considered in my medical decision making (see chart for details).  Clinical Course   We discussed salt intake and smoking cessation to help with blood pressure control. We also discussed meditation techniques to lower blood pressure  Final Clinical Impressions(s) / UC Diagnoses   Final diagnoses:  Medication refill  Essential hypertension    New Prescriptions New Prescriptions   HYDROCHLOROTHIAZIDE (MICROZIDE) 12.5 MG CAPSULE    Take 1 capsule (12.5 mg total) by mouth daily.   MIRTAZAPINE (REMERON) 15 MG TABLET    Take 1 tablet (15 mg total) by mouth at bedtime.     Robyn Haber, MD 06/04/16 [place], MD 06/04/16 1122

## 2016-06-04 NOTE — ED Triage Notes (Signed)
Patient presents today for a medication refill for Rimeron. She also states that she has noticed that her blood pressure has been increasing over the past two weeks and noticed pain in her neck and has been experiencing headaches and reports a tingling in her back.

## 2016-06-21 ENCOUNTER — Ambulatory Visit (INDEPENDENT_AMBULATORY_CARE_PROVIDER_SITE_OTHER): Payer: BLUE CROSS/BLUE SHIELD | Admitting: Family Medicine

## 2016-06-21 ENCOUNTER — Encounter: Payer: Self-pay | Admitting: Family Medicine

## 2016-06-21 VITALS — BP 144/94 | HR 87 | Temp 98.4°F | Resp 16 | Ht 65.5 in | Wt 148.0 lb

## 2016-06-21 DIAGNOSIS — R300 Dysuria: Secondary | ICD-10-CM

## 2016-06-21 DIAGNOSIS — I10 Essential (primary) hypertension: Secondary | ICD-10-CM | POA: Diagnosis not present

## 2016-06-21 DIAGNOSIS — Z131 Encounter for screening for diabetes mellitus: Secondary | ICD-10-CM | POA: Diagnosis not present

## 2016-06-21 DIAGNOSIS — Z1231 Encounter for screening mammogram for malignant neoplasm of breast: Secondary | ICD-10-CM

## 2016-06-21 DIAGNOSIS — Z1239 Encounter for other screening for malignant neoplasm of breast: Secondary | ICD-10-CM

## 2016-06-21 LAB — CBC WITH DIFFERENTIAL/PLATELET
BASOS ABS: 0 {cells}/uL (ref 0–200)
Basophils Relative: 0 %
Eosinophils Absolute: 106 cells/uL (ref 15–500)
Eosinophils Relative: 2 %
HCT: 41.6 % (ref 35.0–45.0)
Hemoglobin: 12.4 g/dL (ref 11.7–15.5)
Lymphocytes Relative: 25 %
Lymphs Abs: 1325 cells/uL (ref 850–3900)
MCH: 25.6 pg — AB (ref 27.0–33.0)
MCHC: 29.8 g/dL — AB (ref 32.0–36.0)
MCV: 85.8 fL (ref 80.0–100.0)
MONO ABS: 583 {cells}/uL (ref 200–950)
MPV: 9.9 fL (ref 7.5–12.5)
Monocytes Relative: 11 %
NEUTROS PCT: 62 %
Neutro Abs: 3286 cells/uL (ref 1500–7800)
Platelets: 425 10*3/uL — ABNORMAL HIGH (ref 140–400)
RBC: 4.85 MIL/uL (ref 3.80–5.10)
RDW: 15.7 % — ABNORMAL HIGH (ref 11.0–15.0)
WBC: 5.3 10*3/uL (ref 3.8–10.8)

## 2016-06-21 LAB — POCT URINALYSIS DIP (DEVICE)
Bilirubin Urine: NEGATIVE
GLUCOSE, UA: NEGATIVE mg/dL
Ketones, ur: NEGATIVE mg/dL
Nitrite: NEGATIVE
Protein, ur: 100 mg/dL — AB
SPECIFIC GRAVITY, URINE: 1.02 (ref 1.005–1.030)
Urobilinogen, UA: 0.2 mg/dL (ref 0.0–1.0)
pH: 6 (ref 5.0–8.0)

## 2016-06-21 LAB — HEMOGLOBIN A1C
Hgb A1c MFr Bld: 5.1 % (ref <5.7)
MEAN PLASMA GLUCOSE: 100 mg/dL

## 2016-06-21 LAB — TSH: TSH: 0.62 mIU/L

## 2016-06-21 MED ORDER — AMLODIPINE BESYLATE 10 MG PO TABS: 10.0000 mg | ORAL_TABLET | Freq: Every day | ORAL | 3 refills | Status: DC

## 2016-06-21 MED ORDER — CLOTRIMAZOLE-BETAMETHASONE 1-0.05 % EX CREA: 1.0000 "application " | TOPICAL_CREAM | Freq: Two times a day (BID) | CUTANEOUS | 0 refills | Status: DC

## 2016-06-21 MED ORDER — SULFAMETHOXAZOLE-TRIMETHOPRIM 800-160 MG PO TABS: 1.0000 | ORAL_TABLET | Freq: Two times a day (BID) | ORAL | 0 refills | Status: DC

## 2016-06-21 MED ORDER — FLUCONAZOLE 150 MG PO TABS: 150.0000 mg | ORAL_TABLET | Freq: Once | ORAL | 0 refills | Status: AC

## 2016-06-21 NOTE — Progress Notes (Signed)
Becky Koch, is a 45 y.o. female  [id number]  [id number]  DOB - [birth year]  CC:  Chief Complaint  Patient presents with  . Establish Care  . Urinary Frequency    burning   . Hypertension       HPI: Becky Koch is a 45 y.o. female here to establish care. She has a history of hypertension, situational depression with insomnia, uterine fibroids and dysmenorrhea. She was seen at Urgent Care recently for refill of amlodipine 5, hctz 12.5 and her Remeron 15. Today is is complaining of urinary frequency and dysuria for the last several days. Her other complaint is of several itchy spots on her back and arms. She has an IUD for her menstural pain and is interested in having removed. She is due a PAP and a mammogram  She reports her Tdap and influenza are current through her work. She reports smoking 1/2 pack of cigarettes daily, using occ alcohol and denies drug use. No Known Allergies Past Medical History:  Diagnosis Date  . Fibroid, uterine   . Hypertension    Current Outpatient Prescriptions on File Prior to Visit  Medication Sig Dispense Refill  . hydrochlorothiazide (MICROZIDE) 12.5 MG capsule Take 1 capsule (12.5 mg total) by mouth daily. 90 capsule 3  . levonorgestrel (MIRENA) 20 MCG/24HR IUD 1 Intra Uterine Device (1 each total) by Intrauterine route once. 1 each 0  . mirtazapine (REMERON) 15 MG tablet Take 1 tablet (15 mg total) by mouth at bedtime. 90 tablet 3  . Multiple Vitamin (MULTIVITAMIN WITH MINERALS) TABS Take 1 tablet by mouth daily.     No current facility-administered medications on file prior to visit.    History reviewed. No pertinent family history. Social History   Social History  . Marital status: Legally Separated    Spouse name: N/A  . Number of children: N/A  . Years of education: N/A   Occupational History  . Not on file.   Social History Main Topics  . Smoking status: Current Every Day Smoker    Packs/day: 0.50    Types: Cigarettes  .  Smokeless tobacco: Never Used  . Alcohol use Yes     Comment: social alcohol  . Drug use: No  . Sexual activity: Yes    Birth control/ protection: Surgical   Other Topics Concern  . Not on file   Social History Narrative  . No narrative on file    Review of Systems: Constitutional: Negative Skin: + for rash as mentioned in HPI HENT: Negative  Eyes: Negative  Neck: Negative Respiratory: Negative Cardiovascular: Negative Gastrointestinal: Negative Genitourinary: + for frequency and dysuria Musculoskeletal: Negative   Neurological: Negative for Hematological: Negative  Psychiatric/Behavioral: + for depression and insomnia due to recent deaths in family   Objective:   Vitals:   06/21/16 0916  BP: (!) 144/94  Pulse: 87  Resp: 16  Temp: 98.4 F (36.9 C)    Physical Exam: Constitutional: Patient appears well-developed and well-nourished. No distress. HENT: Normocephalic, atraumatic, External right and left ear normal. Oropharynx is clear and moist.  Eyes: Conjunctivae and EOM are normal. PERRLA, no scleral icterus. Neck: Normal ROM. Neck supple. No lymphadenopathy, No thyromegaly. CVS: RRR, S1/S2 +, no murmurs, no gallops, no rubs Pulmonary: Effort and breath sounds normal, no stridor, rhonchi, wheezes, rales.  Abdominal: Soft. Normoactive BS,, no distension, tenderness, rebound or guarding.  Musculoskeletal: Normal range of motion. No edema and no tenderness.  Neuro: Alert.Normal muscle tone coordination. Non-focal Skin: Skin is  warm and dry.  Not diaphoretic. No erythema. No pallor. There are 5 small dry appearing spots on her back and arms Psychiatric: Normal mood and affect. Behavior, judgment, thought content normal.  Lab Results  Component Value Date   WBC 4.3 07/14/2012   HGB 10.7 (L) 07/14/2012   HCT 33.8 (L) 07/14/2012   MCV 82.2 07/14/2012   PLT 273 07/14/2012   No results found for: CREATININE, BUN, NA, K, CL, CO2  No results found for:  HGBA1C Lipid Panel  No results found for: CHOL, TRIG, HDL, CHOLHDL, VLDL, LDLCALC     Assessment and plan:   1. Essential hypertension  - CBC with Differential - COMPLETE METABOLIC PANEL WITH GFR - Lipid panel - TSH  2. Screening for diabetes mellitus  - Hemoglobin A1c  3. Screening for breast cancer  - MM DIGITAL SCREENING BILATERAL; Future  4. Dysuria  - Urine culture Septra DS # 10, one po bid for 5 days -Diflucan 150, to be used if she develops yeast infection.   Return in about 6 months (around 12/19/2016).  The patient was given clear instructions to go to ER or return to medical center if symptoms don't improve, worsen or new problems develop. The patient verbalized understanding.    Micheline Chapman FNP  06/21/2016, 9:58 AM

## 2016-06-21 NOTE — Patient Instructions (Signed)
Increase Norvasc to 10 mg a day (take 2 5 mg until your current supply runs out. Then will prescribe 10 mg.  Call and make an appt about IUD and for a PAP.

## 2016-06-22 LAB — COMPLETE METABOLIC PANEL WITH GFR
ALK PHOS: 73 U/L (ref 33–115)
ALT: 18 U/L (ref 6–29)
AST: 37 U/L — ABNORMAL HIGH (ref 10–35)
Albumin: 4.1 g/dL (ref 3.6–5.1)
BUN: 9 mg/dL (ref 7–25)
CALCIUM: 9.5 mg/dL (ref 8.6–10.2)
CO2: 24 mmol/L (ref 20–31)
Chloride: 106 mmol/L (ref 98–110)
Creat: 0.74 mg/dL (ref 0.50–1.10)
GFR, Est African American: >89 mL/min (ref >=60)
GFR, Est Non African American: >89 mL/min (ref >=60)
Glucose, Bld: 77 mg/dL (ref 65–99)
POTASSIUM: 4.3 mmol/L (ref 3.5–5.3)
Sodium: 138 mmol/L (ref 135–146)
Total Bilirubin: 0.3 mg/dL (ref 0.2–1.2)
Total Protein: 7.4 g/dL (ref 6.1–8.1)

## 2016-06-22 LAB — LIPID PANEL
CHOL/HDL RATIO: 4.3 ratio (ref <=5.0)
CHOLESTEROL: 152 mg/dL (ref 125–200)
HDL: 35 mg/dL — AB (ref >=46)
LDL Cholesterol: 95 mg/dL (ref <130)
Triglycerides: 111 mg/dL (ref <150)
VLDL: 22 mg/dL (ref <30)

## 2016-06-23 LAB — URINE CULTURE: ORGANISM ID, BACTERIA: NO GROWTH

## 2016-07-30 ENCOUNTER — Inpatient Hospital Stay: Admission: RE | Admit: 2016-07-30 | Payer: Managed Care, Other (non HMO) | Source: Ambulatory Visit

## 2016-09-10 ENCOUNTER — Ambulatory Visit: Payer: Managed Care, Other (non HMO)

## 2016-12-24 ENCOUNTER — Ambulatory Visit: Payer: Managed Care, Other (non HMO) | Admitting: Family Medicine

## 2017-03-22 ENCOUNTER — Ambulatory Visit (INDEPENDENT_AMBULATORY_CARE_PROVIDER_SITE_OTHER): Payer: Self-pay | Admitting: Family Medicine

## 2017-03-22 ENCOUNTER — Encounter: Payer: Self-pay | Admitting: Family Medicine

## 2017-03-22 VITALS — BP 140/90 | HR 95 | Temp 98.7°F | Resp 14 | Ht 65.5 in | Wt 156.0 lb

## 2017-03-22 DIAGNOSIS — L989 Disorder of the skin and subcutaneous tissue, unspecified: Secondary | ICD-10-CM

## 2017-03-22 DIAGNOSIS — I10 Essential (primary) hypertension: Secondary | ICD-10-CM

## 2017-03-22 DIAGNOSIS — G4700 Insomnia, unspecified: Secondary | ICD-10-CM

## 2017-03-22 LAB — POCT URINALYSIS DIP (DEVICE)
Bilirubin Urine: NEGATIVE
Glucose, UA: NEGATIVE mg/dL
HGB URINE DIPSTICK: NEGATIVE
Ketones, ur: NEGATIVE mg/dL
Leukocytes, UA: NEGATIVE
NITRITE: NEGATIVE
PH: 5.5 (ref 5.0–8.0)
Protein, ur: NEGATIVE mg/dL
Specific Gravity, Urine: 1.025 (ref 1.005–1.030)
UROBILINOGEN UA: 0.2 mg/dL (ref 0.0–1.0)

## 2017-03-22 MED ORDER — HYDROCHLOROTHIAZIDE 25 MG PO TABS: 25.0000 mg | ORAL_TABLET | Freq: Every day | ORAL | 3 refills | Status: DC

## 2017-03-22 MED ORDER — AMLODIPINE BESYLATE 10 MG PO TABS: 10.0000 mg | ORAL_TABLET | Freq: Every day | ORAL | 3 refills | Status: DC

## 2017-03-22 MED ORDER — ZOLPIDEM TARTRATE 5 MG PO TABS: 5.0000 mg | ORAL_TABLET | Freq: Every evening | ORAL | 0 refills | Status: DC | PRN

## 2017-03-22 MED ORDER — TRIAMCINOLONE ACETONIDE 0.1 % EX CREA: 1.0000 "application " | TOPICAL_CREAM | Freq: Two times a day (BID) | CUTANEOUS | 0 refills | Status: DC

## 2017-03-22 NOTE — Patient Instructions (Signed)
Hypertension Hypertension is another name for high blood pressure. High blood pressure forces your heart to work harder to pump blood. This can cause problems over time. There are two numbers in a blood pressure reading. There is a top number (systolic) over a bottom number (diastolic). It is best to have a blood pressure below 120/80. Healthy choices can help lower your blood pressure. You may need medicine to help lower your blood pressure if:  Your blood pressure cannot be lowered with healthy choices.  Your blood pressure is higher than 130/80.  Follow these instructions at home: Eating and drinking  If directed, follow the DASH eating plan. This diet includes: ? Filling half of your plate at each meal with fruits and vegetables. ? Filling one quarter of your plate at each meal with whole grains. Whole grains include whole wheat pasta, brown rice, and whole grain bread. ? Eating or drinking low-fat dairy products, such as skim milk or low-fat yogurt. ? Filling one quarter of your plate at each meal with low-fat (lean) proteins. Low-fat proteins include fish, skinless chicken, eggs, beans, and tofu. ? Avoiding fatty meat, cured and processed meat, or chicken with skin. ? Avoiding premade or processed food.  Eat less than 1,500 mg of salt (sodium) a day.  Limit alcohol use to no more than 1 drink a day for nonpregnant women and 2 drinks a day for men. One drink equals 12 oz of beer, 5 oz of wine, or 1 oz of hard liquor. Lifestyle  Work with your doctor to stay at a healthy weight or to lose weight. Ask your doctor what the best weight is for you.  Get at least 30 minutes of exercise that causes your heart to beat faster (aerobic exercise) most days of the week. This may include walking, swimming, or biking.  Get at least 30 minutes of exercise that strengthens your muscles (resistance exercise) at least 3 days a week. This may include lifting weights or pilates.  Do not use any  products that contain nicotine or tobacco. This includes cigarettes and e-cigarettes. If you need help quitting, ask your doctor.  Check your blood pressure at home as told by your doctor.  Keep all follow-up visits as told by your doctor. This is important. Medicines  Take over-the-counter and prescription medicines only as told by your doctor. Follow directions carefully.  Do not skip doses of blood pressure medicine. The medicine does not work as well if you skip doses. Skipping doses also puts you at risk for problems.  Ask your doctor about side effects or reactions to medicines that you should watch for. Contact a doctor if:  You think you are having a reaction to the medicine you are taking.  You have headaches that keep coming back (recurring).  You feel dizzy.  You have swelling in your ankles.  You have trouble with your vision. Get help right away if:  You get a very bad headache.  You start to feel confused.  You feel weak or numb.  You feel faint.  You get very bad pain in your: ? Chest. ? Belly (abdomen).  You throw up (vomit) more than once.  You have trouble breathing. Summary  Hypertension is another name for high blood pressure.  Making healthy choices can help lower blood pressure. If your blood pressure cannot be controlled with healthy choices, you may need to take medicine. This information is not intended to replace advice given to you by your health care   provider. Make sure you discuss any questions you have with your health care provider. Document Released: 01/23/2008 Document Revised: 07/04/2016 Document Reviewed: 07/04/2016 Elsevier Interactive Patient Education  2018 [employer] Heart-healthy meal planning includes:  Limiting unhealthy fats.  Increasing healthy fats.  Making other small dietary changes.  You may need to talk with your doctor or a diet specialist (dietitian) to create an eating plan  that is right for you. What types of fat should I choose?  Choose healthy fats. These include olive oil and canola oil, flaxseeds, walnuts, almonds, and seeds.  Eat more omega-3 fats. These include salmon, mackerel, sardines, tuna, flaxseed oil, and ground flaxseeds. Try to eat fish at least twice each week.  Limit saturated fats. ? Saturated fats are often found in animal products, such as meats, butter, and cream. ? Plant sources of saturated fats include palm oil, palm kernel oil, and coconut oil.  Avoid foods with partially hydrogenated oils in them. These include stick margarine, some tub margarines, cookies, crackers, and other baked goods. These contain trans fats. What general guidelines do I need to follow?  Check food labels carefully. Identify foods with trans fats or high amounts of saturated fat.  Fill one half of your plate with vegetables and green salads. Eat 4-5 servings of vegetables per day. A serving of vegetables is: ? 1 cup of raw leafy vegetables. ?  cup of raw or cooked cut-up vegetables. ?  cup of vegetable juice.  Fill one fourth of your plate with whole grains. Look for the word "whole" as the first word in the ingredient list.  Fill one fourth of your plate with lean protein foods.  Eat 4-5 servings of fruit per day. A serving of fruit is: ? One medium whole fruit. ?  cup of dried fruit. ?  cup of fresh, frozen, or canned fruit. ?  cup of 100% fruit juice.  Eat more foods that contain soluble fiber. These include apples, broccoli, carrots, beans, peas, and barley. Try to get 20-30 g of fiber per day.  Eat more home-cooked food. Eat less restaurant, buffet, and fast food.  Limit or avoid alcohol.  Limit foods high in starch and sugar.  Avoid fried foods.  Avoid frying your food. Try baking, boiling, grilling, or broiling it instead. You can also reduce fat by: ? Removing the skin from poultry. ? Removing all visible fats from  meats. ? Skimming the fat off of stews, soups, and gravies before serving them. ? Steaming vegetables in water or broth.  Lose weight if you are overweight.  Eat 4-5 servings of nuts, legumes, and seeds per week: ? One serving of dried beans or legumes equals  cup after being cooked. ? One serving of nuts equals 1 ounces. ? One serving of seeds equals  ounce or one tablespoon.  You may need to keep track of how much salt or sodium you eat. This is especially true if you have high blood pressure. Talk with your doctor or dietitian to get more information. What foods can I eat? Grains Breads, including [place], white, pita, wheat, raisin, rye, oatmeal, and [place]. Tortillas that are neither fried nor made with lard or trans fat. Low-fat rolls, including hotdog and hamburger buns and English muffins. Biscuits. Muffins. Waffles. Pancakes. Light popcorn. Whole-grain cereals. Flatbread. Melba toast. Pretzels. Breadsticks. Rusks. Low-fat snacks. Low-fat crackers, including oyster, saltine, matzo, graham, animal, and rye. Rice and pasta, including brown rice and pastas that are made  with whole wheat. Vegetables All vegetables. Fruits All fruits, but limit coconut. Meats and Other Protein Sources Lean, well-trimmed beef, veal, pork, and lamb. Chicken and [place] without skin. All fish and shellfish. Wild duck, rabbit, pheasant, and venison. Egg whites or low-cholesterol egg substitutes. Dried beans, peas, lentils, and tofu. Seeds and most nuts. Dairy Low-fat or nonfat cheeses, including ricotta, string, and mozzarella. Skim or 1% milk that is liquid, powdered, or evaporated. Buttermilk that is made with low-fat milk. Nonfat or low-fat yogurt. Beverages Mineral water. Diet carbonated beverages. Sweets and Desserts Sherbets and fruit ices. Honey, jam, marmalade, jelly, and syrups. Meringues and gelatins. Pure sugar candy, such as hard candy, jelly beans, gumdrops, mints, marshmallows, and small  amounts of dark chocolate. [employer] Eat all sweets and desserts in moderation. Fats and Oils Nonhydrogenated (trans-free) margarines. Vegetable oils, including soybean, sesame, sunflower, olive, peanut, safflower, corn, canola, and cottonseed. Salad dressings or mayonnaise made with a vegetable oil. Limit added fats and oils that you use for cooking, baking, salads, and as spreads. Other Cocoa powder. Coffee and tea. All seasonings and condiments. The items listed above may not be a complete list of recommended foods or beverages. Contact your dietitian for more options. What foods are not recommended? Grains Breads that are made with saturated or trans fats, oils, or whole milk. Croissants. Butter rolls. Cheese breads. Sweet rolls. Donuts. Buttered popcorn. Chow mein noodles. High-fat crackers, such as cheese or butter crackers. Meats and Other Protein Sources Fatty meats, such as hotdogs, short ribs, sausage, spareribs, bacon, rib eye roast or steak, and mutton. High-fat deli meats, such as salami and bologna. Caviar. Domestic duck and goose. Organ meats, such as kidney, liver, sweetbreads, and heart. Dairy Cream, sour cream, cream cheese, and creamed cottage cheese. Whole-milk cheeses, including blue (bleu), [place], [place], [place], American, [place], Swiss, cheddar, [place], and [place]. Whole or 2% milk that is liquid, evaporated, or condensed. Whole buttermilk. Cream sauce or high-fat cheese sauce. Yogurt that is made from whole milk. Beverages Regular sodas and juice drinks with added sugar. Sweets and Desserts Frosting. Pudding. Cookies. Cakes other than angel food cake. Candy that has milk chocolate or white chocolate, hydrogenated fat, butter, coconut, or unknown ingredients. Buttered syrups. Full-fat ice cream or ice cream drinks. Fats and Oils Gravy that has suet, meat fat, or shortening. Cocoa butter, hydrogenated oils, palm oil, coconut oil, palm kernel oil. These can  often be found in baked products, candy, fried foods, nondairy creamers, and whipped toppings. Solid fats and shortenings, including bacon fat, salt pork, lard, and butter. Nondairy cream substitutes, such as coffee creamers and sour cream substitutes. Salad dressings that are made of unknown oils, cheese, or sour cream. The items listed above may not be a complete list of foods and beverages to avoid. Contact your dietitian for more information. This information is not intended to replace advice given to you by your health care provider. Make sure you discuss any questions you have with your health care provider. Document Released: 02/05/2012 Document Revised: 01/12/2016 Document Reviewed: 01/28/2014 Elsevier Interactive Patient Education  2018 [employer].  Exercising to [employer] Exercising regularly is important. It has many health benefits, such as:  Improving your overall fitness, flexibility, and endurance.  Increasing your bone density.  Helping with weight control.  Decreasing your body fat.  Increasing your muscle strength.  Reducing stress and tension.  Improving your overall health.  In order to become healthy and stay healthy, it is recommended that  you do moderate-intensity and vigorous-intensity exercise. You can tell that you are exercising at a moderate intensity if you have a higher heart rate and faster breathing, but you are still able to hold a conversation. You can tell that you are exercising at a vigorous intensity if you are breathing much harder and faster and cannot hold a conversation while exercising. How often should I exercise? Choose an activity that you enjoy and set realistic goals. Your health care provider can help you to make an activity plan that works for you. Exercise regularly as directed by your health care provider. This may include:  Doing resistance training twice each week, such as: ? Push-ups. ? Sit-ups. ? Lifting weights. ? Using  resistance bands.  Doing a given intensity of exercise for a given amount of time. Choose from these options: ? 150 minutes of moderate-intensity exercise every week. ? 75 minutes of vigorous-intensity exercise every week. ? A mix of moderate-intensity and vigorous-intensity exercise every week.  Children, pregnant women, people who are out of shape, people who are overweight, and older adults may need to consult a health care provider for individual recommendations. If you have any sort of medical condition, be sure to consult your health care provider before starting a new exercise program. What are some exercise ideas? Some moderate-intensity exercise ideas include:  Walking at a rate of 1 mile in 15 minutes.  Biking.  Hiking.  Golfing.  Dancing.  Some vigorous-intensity exercise ideas include:  Walking at a rate of at least 4.5 miles per hour.  Jogging or running at a rate of 5 miles per hour.  Biking at a rate of at least 10 miles per hour.  Lap swimming.  Roller-skating or in-line skating.  Cross-country skiing.  Vigorous competitive sports, such as football, basketball, and soccer.  Jumping rope.  Aerobic dancing.  What are some everyday activities that can help me to get exercise?  [place] work, such as: ? Pushing a Conservation officer, nature. ? Raking and bagging leaves.  Washing and waxing your car.  Pushing a stroller.  Shoveling snow.  Gardening.  Washing windows or floors. How can I be more active in my day-to-day activities?  Use the stairs instead of the elevator.  Take a walk during your lunch break.  If you drive, park your car farther away from work or school.  If you take public transportation, get off one stop early and walk the rest of the way.  Make all of your phone calls while standing up and walking around.  Get up, stretch, and walk around every 30 minutes throughout the day. What guidelines should I follow while exercising?  Do not  exercise so much that you hurt yourself, feel dizzy, or get very short of breath.  Consult your health care provider before starting a new exercise program.  Wear comfortable clothes and shoes with good support.  Drink plenty of water while you exercise to prevent dehydration or heat stroke. Body water is lost during exercise and must be replaced.  Work out until you breathe faster and your heart beats faster. This information is not intended to replace advice given to you by your health care provider. Make sure you discuss any questions you have with your health care provider. Document Released: 09/08/2010 Document Revised: 01/12/2016 Document Reviewed: 01/07/2014 Elsevier Interactive Patient Education  [employer].

## 2017-03-22 NOTE — Progress Notes (Signed)
Patient ID: Becky Koch, female    DOB: August 10, 1971, 46 y.o.   MRN: [id number]  PCP: Scot Jun, FNP  Chief Complaint  Patient presents with  . Follow-up    blood pressure    Subjective:  HPI Becky Koch is a 46 y.o. female presents for evaluation of hypertension. Medical problems include: Hypertension, Insomnia, Skin Dermal Lesions.  Hypertension [place] reports no home monitoring of blood pressure. She reports adherence with current antihypertensive treatment. Reports efforts to adhere to low sodium diet. She is a 1/2 pack per day smoker. Denies any episodes of dizziness,headaches, shortness of breath, or chest pain. Concern regarding current weight. Current Body mass index is 25.56 kg/m.  [place] reports that she engages in routine physical exercise. She was in the past prescribed Remeron for depression, anxiety, with insomnia. She complains of increased weight gain since starting Remeron. She reports resolution of anxiety and depression symptoms and would like to start medication to treat insomnia only.  Skin Dermal Lesions  Reports recurrent dermal lesion ongoing for extended period of time. Lesions have been present intermittently for greater than 6 months. Lesions are itchy. She has received treatment with antifungal and cortisone preparations with only temporary relief of symptoms. She is requesting a second opinion and requests a referral to dermatology.    Social History   Social History  . Marital status: Legally Separated    Spouse name: N/A  . Number of children: N/A  . Years of education: N/A   Occupational History  . Not on file.   Social History Main Topics  . Smoking status: Current Every Day Smoker    Packs/day: 0.50    Types: Cigarettes  . Smokeless tobacco: Never Used  . Alcohol use Yes     Comment: social alcohol  . Drug use: No  . Sexual activity: Yes    Birth control/ protection: Surgical   Other Topics Concern  . Not on file    Social History Narrative  . No narrative on file    Family History  Problem Relation Age of Onset  . Cancer Paternal Grandfather    Review of Systems  Constitutional: Positive for appetite change.  HENT: Negative.   Cardiovascular: Negative.   Gastrointestinal: Negative.   Skin: Positive for rash.  Neurological: Negative.     Patient Active Problem List   Diagnosis Date Noted  . Dysmenorrhea 07/14/2012  . Menorrhagia 07/14/2012  . History of uterine fibroid 07/14/2012  . HTN (hypertension) 07/14/2012    No Known Allergies  Prior to Admission medications   Medication Sig Start Date End Date Taking? Authorizing Provider  amLODipine (NORVASC) 10 MG tablet Take 1 tablet (10 mg total) by mouth daily. 06/21/16  Yes Micheline Chapman, NP  hydrochlorothiazide (MICROZIDE) 12.5 MG capsule Take 1 capsule (12.5 mg total) by mouth daily. 06/04/16  Yes Robyn Haber, MD  mirtazapine (REMERON) 15 MG tablet Take 1 tablet (15 mg total) by mouth at bedtime. 06/04/16  Yes Robyn Haber, MD  Multiple Vitamin (MULTIVITAMIN WITH MINERALS) TABS Take 1 tablet by mouth daily.   Yes [provider]  clotrimazole-betamethasone (LOTRISONE) cream Apply 1 application topically 2 (two) times daily. Patient not taking: Reported on 03/22/2017 06/21/16   Micheline Chapman, NP  levonorgestrel (MIRENA) 20 MCG/24HR IUD 1 Intra Uterine Device (1 each total) by Intrauterine route once. Patient not taking: Reported on 03/22/2017 01/01/13   Woodroe Mode, MD    Past Medical, Surgical Family and  Social History reviewed and updated.    Objective:   Today's Vitals   03/22/17 1502  BP: 140/90  Pulse: 95  Resp: 14  Temp: 98.7 F (37.1 C)  TempSrc: Oral  SpO2: 100%  Weight: 156 lb (70.8 kg)  Height: 5' 5.5" (1.664 m)    Wt Readings from Last 3 Encounters:  03/22/17 156 lb (70.8 kg)  06/21/16 148 lb (67.1 kg)  12/29/14 155 lb (70.3 kg)   Physical Exam  Constitutional: She is oriented to  person, place, and time. She appears well-developed and well-nourished.  HENT:  Head: Normocephalic and atraumatic.  Neck: Normal range of motion. Neck supple. No thyromegaly present.  Cardiovascular: Normal rate, regular rhythm, normal heart sounds and intact distal pulses.   Pulmonary/Chest: Effort normal and breath sounds normal.  Musculoskeletal: Normal range of motion.  Neurological: She is alert and oriented to person, place, and time.  Skin: Skin is warm, dry and intact. Rash noted. Rash is maculopapular.  Skin lesions appearance of fungal type lesions   Psychiatric: She has a normal mood and affect. Her behavior is normal. Judgment and thought content normal.   Assessment & Plan:  1. Essential hypertension, stable , Goal <140/90 -Continue amlodipine 10 mg, daily  -Continue hydrochlorothiazides 25 mg, daily    2. Insomnia, unspecified type -Discontinue Remeron -Will trial Ambien 5 mg once daily at bedtime  3. Skin lesion, appearance consistent with tinea infection  - Ambulatory referral to Dermatology -Will trial triamcinolone cream, twice daily in an attempt to resolve skin lesions.  RTC: BP Check 3 weeks and 6 month hypertension follow-up  Carroll Sage. Kenton Kingfisher, MSN, FNP-C [facility]  [address] [place], [place] [address] [phone]

## 2017-04-15 ENCOUNTER — Ambulatory Visit (INDEPENDENT_AMBULATORY_CARE_PROVIDER_SITE_OTHER): Payer: PRIVATE HEALTH INSURANCE | Admitting: Obstetrics & Gynecology

## 2017-04-15 ENCOUNTER — Encounter: Payer: Self-pay | Admitting: Obstetrics & Gynecology

## 2017-04-15 ENCOUNTER — Telehealth: Payer: Self-pay | Admitting: *Deleted

## 2017-04-15 VITALS — BP 127/83 | HR 85 | Ht 65.5 in | Wt 154.8 lb

## 2017-04-15 DIAGNOSIS — Z124 Encounter for screening for malignant neoplasm of cervix: Secondary | ICD-10-CM | POA: Diagnosis not present

## 2017-04-15 DIAGNOSIS — Z0000 Encounter for general adult medical examination without abnormal findings: Secondary | ICD-10-CM

## 2017-04-15 DIAGNOSIS — Z1151 Encounter for screening for human papillomavirus (HPV): Secondary | ICD-10-CM | POA: Diagnosis not present

## 2017-04-15 DIAGNOSIS — N946 Dysmenorrhea, unspecified: Secondary | ICD-10-CM

## 2017-04-15 NOTE — Telephone Encounter (Signed)
Patient scheduled for pelvic u/s 9/4 @ 1345. Called patient and notified her of appointment. Understanding was voiced.

## 2017-04-15 NOTE — Progress Notes (Signed)
   Subjective:    Patient ID: Becky Koch, female    DOB: 05/11/1971, 46 y.o.   MRN: [id number]  HPI 46 yo MAA P4 here to have her 46 yo Mirena IUD removed. She has been having bad cramps with her periods. She has had her tubes tied and had the IUD placed for menorrhagia.   Review of Systems She is a CMA and a hair stylist. Married for 27 years    Objective:   Physical Exam Well nourished, well hydrated black female, no apparent distress Breathing, conversing, and ambulating normally Abd- benign Cervix- appears normal. Mirena easily removed and noted to be intact. Bimanual exam reveals a non- prolapsed ULN size midplane uterus No adnexal masses palpable Her pubic arch is not wide (although she did deliver a 9# baby vaginally)       Assessment & Plan:  Pain with periods- IUD now out Schedule with gyn u/s Preventative care- pap smear done today

## 2017-04-17 LAB — CYTOLOGY - PAP
Diagnosis: UNDETERMINED — AB
HPV: NOT DETECTED

## 2017-04-19 ENCOUNTER — Telehealth: Payer: Self-pay

## 2017-04-19 NOTE — Telephone Encounter (Signed)
-----   Message from Emily Filbert, MD sent at 04/18/2017  5:06 PM EDT ----- She will need a repeat pap with cotesting in a year. Thanks

## 2017-04-19 NOTE — Telephone Encounter (Signed)
Called patient inform her of results/ recommendation for pap in a year.

## 2017-04-23 ENCOUNTER — Ambulatory Visit (HOSPITAL_COMMUNITY): Payer: PRIVATE HEALTH INSURANCE

## 2017-04-24 ENCOUNTER — Ambulatory Visit (HOSPITAL_COMMUNITY)
Admission: RE | Admit: 2017-04-24 | Discharge: 2017-04-24 | Disposition: A | Discharge status: Home / Self Care | Payer: PRIVATE HEALTH INSURANCE | Source: Ambulatory Visit | Attending: Obstetrics & Gynecology | Admitting: Obstetrics & Gynecology

## 2017-04-24 DIAGNOSIS — N946 Dysmenorrhea, unspecified: Secondary | ICD-10-CM | POA: Diagnosis present

## 2017-04-24 DIAGNOSIS — D259 Leiomyoma of uterus, unspecified: Secondary | ICD-10-CM | POA: Diagnosis not present

## 2017-05-02 DIAGNOSIS — L570 Actinic keratosis: Secondary | ICD-10-CM | POA: Insufficient documentation

## 2017-05-06 ENCOUNTER — Telehealth: Payer: Self-pay

## 2017-05-06 ENCOUNTER — Other Ambulatory Visit: Payer: Self-pay | Admitting: Family Medicine

## 2017-05-06 DIAGNOSIS — G4700 Insomnia, unspecified: Secondary | ICD-10-CM

## 2017-05-06 MED ORDER — ZOLPIDEM TARTRATE 5 MG PO TABS: 5.0000 mg | ORAL_TABLET | Freq: Every evening | ORAL | 0 refills | Status: DC | PRN

## 2017-05-06 NOTE — Progress Notes (Signed)
Ambien qty # [phone]. Refilled. Please fax to pharmacy. Notify patient medication is ready for pickup

## 2017-05-15 ENCOUNTER — Ambulatory Visit: Payer: PRIVATE HEALTH INSURANCE | Admitting: Family Medicine

## 2017-05-24 ENCOUNTER — Encounter: Payer: Self-pay | Admitting: Family Medicine

## 2017-05-24 ENCOUNTER — Ambulatory Visit (INDEPENDENT_AMBULATORY_CARE_PROVIDER_SITE_OTHER): Payer: PRIVATE HEALTH INSURANCE | Admitting: Family Medicine

## 2017-05-24 VITALS — BP 138/94 | HR 96 | Temp 98.8°F | Resp 14 | Ht 65.5 in | Wt 154.0 lb

## 2017-05-24 DIAGNOSIS — I10 Essential (primary) hypertension: Secondary | ICD-10-CM

## 2017-05-24 DIAGNOSIS — M62830 Muscle spasm of back: Secondary | ICD-10-CM

## 2017-05-24 DIAGNOSIS — Z0000 Encounter for general adult medical examination without abnormal findings: Secondary | ICD-10-CM | POA: Diagnosis not present

## 2017-05-24 LAB — POCT URINALYSIS DIP (DEVICE)
Bilirubin Urine: NEGATIVE
Glucose, UA: NEGATIVE mg/dL
KETONES UR: NEGATIVE mg/dL
Leukocytes, UA: NEGATIVE
Nitrite: NEGATIVE
PROTEIN: 30 mg/dL — AB
Specific Gravity, Urine: 1.02 (ref 1.005–1.030)
UROBILINOGEN UA: 0.2 mg/dL (ref 0.0–1.0)
pH: 7 (ref 5.0–8.0)

## 2017-05-24 LAB — BASIC METABOLIC PANEL WITH GFR
BUN: 12 mg/dL (ref 7–25)
CO2: 29 mmol/L (ref 20–32)
CREATININE: 0.82 mg/dL (ref 0.50–1.10)
Calcium: 10.1 mg/dL (ref 8.6–10.2)
Chloride: 102 mmol/L (ref 98–110)
GFR, EST NON AFRICAN AMERICAN: 85 mL/min/{1.73_m2} (ref >=60)
GFR, Est African American: 98 mL/min/{1.73_m2} (ref >=60)
Glucose, Bld: 94 mg/dL (ref 65–99)
Potassium: 4 mmol/L (ref 3.5–5.3)
Sodium: 138 mmol/L (ref 135–146)

## 2017-05-24 MED ORDER — CYCLOBENZAPRINE HCL 5 MG PO TABS: 5.0000 mg | ORAL_TABLET | Freq: Every day | ORAL | 1 refills | Status: DC

## 2017-05-24 NOTE — Patient Instructions (Addendum)
Back spasm-Will start a trial of Cyclobenzaprine 5 mg at bedtime. Continue warm compresses up mid upper back as needed.   Reviewed urinalysis, you do not have a urinary tract infection.   Your hypertension is at goal on current medication regimen. No changes warranted.   Continue medications, monitor blood pressure at home. Continue DASH diet. Reminder to go to the ER if any CP, SOB, nausea, dizziness, severe HA, changes vision/speech, left arm numbness and tingling and jaw pain.  DASH Eating Plan DASH stands for "Dietary Approaches to Stop Hypertension." The DASH eating plan is a healthy eating plan that has been shown to reduce high blood pressure (hypertension). It may also reduce your risk for type 2 diabetes, heart disease, and stroke. The DASH eating plan may also help with weight loss. What are tips for following this plan? General guidelines  Avoid eating more than 2,300 mg (milligrams) of salt (sodium) a day. If you have hypertension, you may need to reduce your sodium intake to 1,500 mg a day.  Limit alcohol intake to no more than 1 drink a day for nonpregnant women and 2 drinks a day for men. One drink equals 12 oz of beer, 5 oz of wine, or 1 oz of hard liquor.  Work with your health care provider to maintain a healthy body weight or to lose weight. Ask what an ideal weight is for you.  Get at least 30 minutes of exercise that causes your heart to beat faster (aerobic exercise) most days of the week. Activities may include walking, swimming, or biking.  Work with your health care provider or diet and nutrition specialist (dietitian) to adjust your eating plan to your individual calorie needs. Reading food labels  Check food labels for the amount of sodium per serving. Choose foods with less than 5 percent of the Daily Value of sodium. Generally, foods with less than 300 mg of sodium per serving fit into this eating plan.  To find whole grains, look for the word "whole" as the  first word in the ingredient list. Shopping  Buy products labeled as "low-sodium" or "no salt added."  Buy fresh foods. Avoid canned foods and premade or frozen meals. Cooking  Avoid adding salt when cooking. Use salt-free seasonings or herbs instead of table salt or sea salt. Check with your health care provider or pharmacist before using salt substitutes.  Do not fry foods. Cook foods using healthy methods such as baking, boiling, grilling, and broiling instead.  Cook with heart-healthy oils, such as olive, canola, soybean, or sunflower oil. Meal planning   Eat a balanced diet that includes: ? 5 or more servings of fruits and vegetables each day. At each meal, try to fill half of your plate with fruits and vegetables. ? Up to 6-8 servings of whole grains each day. ? Less than 6 oz of lean meat, poultry, or fish each day. A 3-oz serving of meat is about the same size as a deck of cards. One egg equals 1 oz. ? 2 servings of low-fat dairy each day. ? A serving of nuts, seeds, or beans 5 times each week. ? Heart-healthy fats. Healthy fats called Omega-3 fatty acids are found in foods such as flaxseeds and coldwater fish, like sardines, salmon, and mackerel.  Limit how much you eat of the following: ? Canned or prepackaged foods. ? Food that is high in trans fat, such as fried foods. ? Food that is high in saturated fat, such as fatty meat. ?  Sweets, desserts, sugary drinks, and other foods with added sugar. ? Full-fat dairy products.  Do not salt foods before eating.  Try to eat at least 2 vegetarian meals each week.  Eat more home-cooked food and less restaurant, buffet, and fast food.  When eating at a restaurant, ask that your food be prepared with less salt or no salt, if possible. What foods are recommended? The items listed may not be a complete list. Talk with your dietitian about what dietary choices are best for you. Grains Whole-grain or whole-wheat bread. Whole-grain  or whole-wheat pasta. Brown rice. [place]. Bulgur. Whole-grain and low-sodium cereals. Pita bread. Low-fat, low-sodium crackers. Whole-wheat flour tortillas. Vegetables Fresh or frozen vegetables (raw, steamed, roasted, or grilled). Low-sodium or reduced-sodium tomato and vegetable juice. Low-sodium or reduced-sodium tomato sauce and tomato paste. Low-sodium or reduced-sodium canned vegetables. Fruits All fresh, dried, or frozen fruit. Canned fruit in natural juice (without added sugar). Meat and other protein foods Skinless chicken or [place]. Ground chicken or [place]. Pork with fat trimmed off. Fish and seafood. Egg whites. Dried beans, peas, or lentils. Unsalted nuts, nut butters, and seeds. Unsalted canned beans. Lean cuts of beef with fat trimmed off. Low-sodium, lean deli meat. Dairy Low-fat (1%) or fat-free (skim) milk. Fat-free, low-fat, or reduced-fat cheeses. Nonfat, low-sodium ricotta or cottage cheese. Low-fat or nonfat yogurt. Low-fat, low-sodium cheese. Fats and oils Soft margarine without trans fats. Vegetable oil. Low-fat, reduced-fat, or light mayonnaise and salad dressings (reduced-sodium). Canola, safflower, olive, soybean, and sunflower oils. Avocado. Seasoning and other foods Herbs. Spices. Seasoning mixes without salt. Unsalted popcorn and pretzels. Fat-free sweets. What foods are not recommended? The items listed may not be a complete list. Talk with your dietitian about what dietary choices are best for you. Grains Baked goods made with fat, such as croissants, muffins, or some breads. Dry pasta or rice meal packs. Vegetables Creamed or fried vegetables. Vegetables in a cheese sauce. Regular canned vegetables (not low-sodium or reduced-sodium). Regular canned tomato sauce and paste (not low-sodium or reduced-sodium). Regular tomato and vegetable juice (not low-sodium or reduced-sodium). Angie Fava. Olives. Fruits Canned fruit in a light or heavy syrup. Fried fruit.  Fruit in cream or butter sauce. Meat and other protein foods Fatty cuts of meat. Ribs. Fried meat. Berniece Salines. Sausage. Bologna and other processed lunch meats. Salami. Fatback. Hotdogs. Bratwurst. Salted nuts and seeds. Canned beans with added salt. Canned or smoked fish. Whole eggs or egg yolks. Chicken or [place] with skin. Dairy Whole or 2% milk, cream, and half-and-half. Whole or full-fat cream cheese. Whole-fat or sweetened yogurt. Full-fat cheese. Nondairy creamers. Whipped toppings. Processed cheese and cheese spreads. Fats and oils Butter. Stick margarine. Lard. Shortening. Ghee. Bacon fat. Tropical oils, such as coconut, palm kernel, or palm oil. Seasoning and other foods Salted popcorn and pretzels. Onion salt, garlic salt, seasoned salt, table salt, and sea salt. Worcestershire sauce. Tartar sauce. Barbecue sauce. Teriyaki sauce. Soy sauce, including reduced-sodium. Steak sauce. Canned and packaged gravies. Fish sauce. Oyster sauce. Cocktail sauce. Horseradish that you find on the shelf. Ketchup. Mustard. Meat flavorings and tenderizers. Bouillon cubes. Hot sauce and Tabasco sauce. Premade or packaged marinades. Premade or packaged taco seasonings. Relishes. Regular salad dressings. Where to find more information:  National Heart, Lung, and [place]: https://wilson-eaton.com/  American Heart Association: www.heart.org Summary  The DASH eating plan is a healthy eating plan that has been shown to reduce high blood pressure (hypertension). It may also reduce your risk for type 2 diabetes,  heart disease, and stroke.  With the DASH eating plan, you should limit salt (sodium) intake to 2,300 mg a day. If you have hypertension, you may need to reduce your sodium intake to 1,500 mg a day.  When on the DASH eating plan, aim to eat more fresh fruits and vegetables, whole grains, lean proteins, low-fat dairy, and heart-healthy fats.  Work with your health care provider or diet and nutrition  specialist (dietitian) to adjust your eating plan to your individual calorie needs. This information is not intended to replace advice given to you by your health care provider. Make sure you discuss any questions you have with your health care provider. Document Released: 07/26/2011 Document Revised: 07/30/2016 Document Reviewed: 07/30/2016 Elsevier Interactive Patient Education  2017 Elsevier Inc.    Muscle Cramps and Spasms Muscle cramps and spasms are when muscles tighten by themselves. They usually get better within minutes. Muscle cramps are painful. They are usually stronger and last longer than muscle spasms. Muscle spasms may or may not be painful. They can last a few seconds or much longer. Follow these instructions at home:  Drink enough fluid to keep your pee (urine) clear or pale yellow.  Massage, stretch, and relax the muscle.  If directed, apply heat to tight or tense muscles as often as told by your doctor. Use the heat source that your doctor recommends. ? Place a towel between your skin and the heat source. ? Leave the heat on for 20-30 minutes. ? Take off the heat if your skin turns bright red. This is especially important if you are unable to feel pain, heat, or cold. You may have a greater risk of getting burned.  If directed, put ice on the affected area. This may help if you are sore or have pain after a cramp or spasm. ? Put ice in a plastic bag. ? Place a towel between your skin and the bag. ? Leave the ice on for 20 minutes, 2-3 times a day.  Take over-the-counter and prescription medicines only as told by your doctor.  Pay attention to any changes in your symptoms. Contact a doctor if:  Your cramps or spasms get worse or happen more often.  Your cramps or spasms do not get better with time. This information is not intended to replace advice given to you by your health care provider. Make sure you discuss any questions you have with your health care  provider. Document Released: 07/19/2008 Document Revised: 09/07/2015 Document Reviewed: 05/10/2015 Elsevier Interactive Patient Education  2018 [employer].

## 2017-05-24 NOTE — Progress Notes (Signed)
Subjective:    Patient ID: Becky Koch, female    DOB: 28-Jan-1971, 46 y.o.   MRN: [id number]  Rafeef Lau, a very pleasant 46 year old female presents accompanied by granddaughter for a follow up of hypertension. Ms. Weinhold has been taking hydrocholorothiazide and amlodipine consistently without complication. She does not exercise routinely and has been following a low sodium diet. She is a former smoker, she quit smoking 1 month ago.   Ms. Mcmillion was referred to dermatology in August for a history of actinic keratosis. She says that right arm was evaluated by dermatology and she has precancerous cells. She is scheduled for a removal in 1 month.   Patient is complaining of spasms to mid upper back for the past several days. She says that she has family in town and has been sleeping on an air mattress. She says that current pain intensity is 4/10 described as intermittent spasms. She has been taking cyclobenzaprine with maximum relief.    Hypertension  This is a chronic problem. The problem is controlled. Pertinent negatives include no anxiety, blurred vision, chest pain, malaise/fatigue, neck pain, orthopnea, palpitations, peripheral edema, PND or shortness of breath. Past treatments include calcium channel blockers.   Past Medical History:  Diagnosis Date  . Fibroid, uterine   . Hypertension    Social History   Social History  . Marital status: Legally Separated    Spouse name: N/A  . Number of children: N/A  . Years of education: N/A   Occupational History  . Not on file.   Social History Main Topics  . Smoking status: Current Every Day Smoker    Packs/day: 0.50    Types: Cigarettes  . Smokeless tobacco: Never Used  . Alcohol use Yes     Comment: social alcohol  . Drug use: No  . Sexual activity: Yes    Birth control/ protection: Surgical   Other Topics Concern  . Not on file   Social History Narrative  . No narrative on file    There is no immunization history  on file for this patient. No Known Allergies Review of Systems  Constitutional: Negative.  Negative for malaise/fatigue.  HENT: Negative.   Eyes: Negative.  Negative for blurred vision.  Respiratory: Negative.  Negative for shortness of breath.   Cardiovascular: Negative.  Negative for chest pain, palpitations, orthopnea, leg swelling and PND.  Gastrointestinal: Negative.   Endocrine: Negative for polydipsia, polyphagia and polyuria.  Genitourinary: Negative.   Musculoskeletal: Positive for back pain. Negative for neck pain.  Allergic/Immunologic: Negative.   Neurological: Negative.   Psychiatric/Behavioral: Negative.       Objective:   Physical Exam  Constitutional: She is oriented to person, place, and time.  Eyes: Pupils are equal, round, and reactive to light.  Neck: Normal range of motion. Neck supple.  Cardiovascular: Normal rate, regular rhythm, normal heart sounds and intact distal pulses.   Pulmonary/Chest: Effort normal and breath sounds normal.  Abdominal: Soft. Bowel sounds are normal.  Musculoskeletal:       Thoracic back: She exhibits spasm. She exhibits normal range of motion, no tenderness and no swelling.  Neurological: She is alert and oriented to person, place, and time.  Skin: Skin is warm and dry.  Psychiatric: She has a normal mood and affect. Her behavior is normal. Thought content normal.     BP (!) 138/94 (BP Location: Right Arm, Patient Position: Sitting, Cuff Size: Normal)   Pulse 96   Temp 98.8 F (  37.1 C) (Oral)   Resp 14   Ht 5' 5.5" (1.664 m)   Wt 154 lb (69.9 kg)   LMP 05/20/2017   SpO2 100%   BMI 25.24 kg/m  Assessment & Plan:  1. Essential hypertension Blood pressure is at goal on current medication regimen.  Reviewed urinalysis, proteinuria present. Patient is on menstrual cycle. To repeat urinalysis during follow up visit. Will review renal functioning as results become available.   - BASIC METABOLIC PANEL WITH GFR  2. Muscle spasm  of back Will continue cyclobenzaprine 5 mg HS for back spasms Recommend warm moist compresses 20 minutes 4 times per day as needed - cyclobenzaprine (FLEXERIL) 5 MG tablet; Take 1 tablet (5 mg total) by mouth at bedtime.  Dispense: 20 tablet; Refill: 1   Health Maintenance:  Recommended influenza vaccination, patient declined Continue smoking cessation Continue medication, monitor blood pressure at home. Continue DASH diet. Reminder to go to the ER if any CP, SOB, nausea, dizziness, severe HA, changes vision/speech, left arm numbness and tingling and jaw pain. Fasting Cholesterol screening at follow up visit    RTC: 3 months for hypertension with Molli Barrows, FNP   Donia Pounds  MSN, FNP-C [facility] [address] [place], [place] [address] [phone]

## 2017-08-23 ENCOUNTER — Ambulatory Visit: Payer: PRIVATE HEALTH INSURANCE | Admitting: Family Medicine

## 2018-01-21 ENCOUNTER — Encounter (HOSPITAL_COMMUNITY): Payer: Self-pay

## 2018-01-21 ENCOUNTER — Other Ambulatory Visit: Payer: Self-pay

## 2018-01-21 ENCOUNTER — Inpatient Hospital Stay (HOSPITAL_COMMUNITY)
Admission: AD | Admit: 2018-01-21 | Discharge: 2018-01-21 | Disposition: A | Discharge status: Home / Self Care | Payer: PRIVATE HEALTH INSURANCE | Source: Ambulatory Visit | Attending: Obstetrics & Gynecology | Admitting: Obstetrics & Gynecology

## 2018-01-21 DIAGNOSIS — Z79899 Other long term (current) drug therapy: Secondary | ICD-10-CM | POA: Insufficient documentation

## 2018-01-21 DIAGNOSIS — T192XXA Foreign body in vulva and vagina, initial encounter: Secondary | ICD-10-CM

## 2018-01-21 DIAGNOSIS — I10 Essential (primary) hypertension: Secondary | ICD-10-CM | POA: Insufficient documentation

## 2018-01-21 DIAGNOSIS — N946 Dysmenorrhea, unspecified: Secondary | ICD-10-CM | POA: Insufficient documentation

## 2018-01-21 DIAGNOSIS — R109 Unspecified abdominal pain: Secondary | ICD-10-CM

## 2018-01-21 DIAGNOSIS — X58XXXA Exposure to other specified factors, initial encounter: Secondary | ICD-10-CM | POA: Insufficient documentation

## 2018-01-21 DIAGNOSIS — N920 Excessive and frequent menstruation with regular cycle: Secondary | ICD-10-CM

## 2018-01-21 DIAGNOSIS — F17210 Nicotine dependence, cigarettes, uncomplicated: Secondary | ICD-10-CM | POA: Insufficient documentation

## 2018-01-21 LAB — URINALYSIS, ROUTINE W REFLEX MICROSCOPIC
BACTERIA UA: NONE SEEN
Bilirubin Urine: NEGATIVE
Glucose, UA: NEGATIVE mg/dL
Ketones, ur: NEGATIVE mg/dL
Leukocytes, UA: NEGATIVE
Nitrite: NEGATIVE
Protein, ur: NEGATIVE mg/dL
SPECIFIC GRAVITY, URINE: 1.021 (ref 1.005–1.030)
pH: 6 (ref 5.0–8.0)

## 2018-01-21 LAB — CBC
HCT: 32.1 % — ABNORMAL LOW (ref 36.0–46.0)
HEMOGLOBIN: 10.1 g/dL — AB (ref 12.0–15.0)
MCH: 26.1 pg (ref 26.0–34.0)
MCHC: 31.5 g/dL (ref 30.0–36.0)
MCV: 82.9 fL (ref 78.0–100.0)
PLATELETS: 443 10*3/uL — AB (ref 150–400)
RBC: 3.87 MIL/uL (ref 3.87–5.11)
RDW: 14.7 % (ref 11.5–15.5)
WBC: 6.6 10*3/uL (ref 4.0–10.5)

## 2018-01-21 LAB — POCT PREGNANCY, URINE: Preg Test, Ur: NEGATIVE

## 2018-01-21 LAB — WET PREP, GENITAL
CLUE CELLS WET PREP: NONE SEEN
SPERM: NONE SEEN
Trich, Wet Prep: NONE SEEN
WBC WET PREP: NONE SEEN
YEAST WET PREP: NONE SEEN

## 2018-01-21 MED ORDER — KETOROLAC TROMETHAMINE 30 MG/ML IJ SOLN
60.0000 mg | Freq: Once | INTRAMUSCULAR | Status: AC
  Administered 2018-01-21: 60 mg via INTRAMUSCULAR
  Filled 2018-01-21 (×2): qty 2

## 2018-01-21 MED ORDER — NAPROXEN 500 MG PO TABS: 500.0000 mg | ORAL_TABLET | Freq: Two times a day (BID) | ORAL | 0 refills | Status: DC | PRN

## 2018-01-21 NOTE — MAU Provider Note (Signed)
History     CSN: [id number]  Arrival date and time: 01/21/18 1449   First Provider Initiated Contact with Patient 01/21/18 1536      Chief Complaint  Patient presents with  . Abdominal Pain   47 y.o. female here with VB and vaginal odor. LMP finished 1 week ago then she started bleeding again 3 days ago. The blood is dark brown and is malodorous. Last IC was yesterday and bleeding increased after. No new partner. No concern for STIs. She has been followed in clinic for menorrhagia and had IUD removed last year d/t cramping. Since then her menses have worsened in flow and cramping.    Past Medical History:  Diagnosis Date  . Fibroid, uterine   . Hypertension     Past Surgical History:  Procedure Laterality Date  . TUBAL LIGATION      Family History  Problem Relation Age of Onset  . Cancer Paternal Grandfather     Social History   Tobacco Use  . Smoking status: Current Every Day Smoker    Packs/day: 0.50    Types: Cigarettes  . Smokeless tobacco: Never Used  Substance Use Topics  . Alcohol use: Yes    Comment: social alcohol  . Drug use: No    Allergies: No Known Allergies  Medications Prior to Admission  Medication Sig Dispense Refill Last Dose  . amLODipine (NORVASC) 10 MG tablet Take 1 tablet (10 mg total) by mouth daily. 90 tablet 3 01/21/2018 at Unknown time  . hydrochlorothiazide (HYDRODIURIL) 25 MG tablet Take 1 tablet (25 mg total) by mouth daily. 90 tablet 3 01/21/2018 at Unknown time    Review of Systems  Constitutional: Positive for fatigue. Negative for fever.  Gastrointestinal: Positive for abdominal pain.  Genitourinary: Positive for vaginal bleeding.   Physical Exam   Blood pressure (!) 141/91, pulse 86, temperature 99 F (37.2 C), temperature source Oral, resp. rate 18, weight 149 lb 1 oz (67.6 kg), last menstrual period 01/07/2018, SpO2 100 %.  Physical Exam  Constitutional: She appears well-developed and well-nourished. No distress.  HENT:   Head: Normocephalic and atraumatic.  Neck: Normal range of motion.  Cardiovascular: Normal rate.  Respiratory: Effort normal. No respiratory distress.  Genitourinary:  Genitourinary Comments: External: no lesions or erythema Vagina: rugated, pink, moist, small drk red bloody discharge, tampon  Removed with ring forcep, ++malodor Uterus: non enlarged, anteverted, non tender, no CMT Adnexae: no masses, no tenderness left, no tenderness right     Results for orders placed or performed during the hospital encounter of 01/21/18 (from the past 24 hour(s))  Urinalysis, Routine w reflex microscopic     Status: Abnormal   Collection Time: 01/21/18  3:05 PM  Result Value Ref Range   Color, Urine YELLOW YELLOW   APPearance CLEAR CLEAR   Specific Gravity, Urine 1.021 1.005 - 1.030   pH 6.0 5.0 - 8.0   Glucose, UA NEGATIVE NEGATIVE mg/dL   Hgb urine dipstick LARGE (A) NEGATIVE   Bilirubin Urine NEGATIVE NEGATIVE   Ketones, ur NEGATIVE NEGATIVE mg/dL   Protein, ur NEGATIVE NEGATIVE mg/dL   Nitrite NEGATIVE NEGATIVE   Leukocytes, UA NEGATIVE NEGATIVE   RBC / HPF 0-5 0 - 5 RBC/hpf   WBC, UA 0-5 0 - 5 WBC/hpf   Bacteria, UA NONE SEEN NONE SEEN   Squamous Epithelial / LPF 0-5 0 - 5   Mucus PRESENT   Pregnancy, urine POC     Status: None   Collection Time: 01/21/18  3:13 PM  Result Value Ref Range   Preg Test, Ur NEGATIVE NEGATIVE  CBC     Status: Abnormal   Collection Time: 01/21/18  3:29 PM  Result Value Ref Range   WBC 6.6 4.0 - 10.5 K/uL   RBC 3.87 3.87 - 5.11 MIL/uL   Hemoglobin 10.1 (L) 12.0 - 15.0 g/dL   HCT 32.1 (L) 36.0 - 46.0 %   MCV 82.9 78.0 - 100.0 fL   MCH 26.1 26.0 - 34.0 pg   MCHC 31.5 30.0 - 36.0 g/dL   RDW 14.7 11.5 - 15.5 %   Platelets 443 (H) 150 - 400 K/uL  Wet prep, genital     Status: None   Collection Time: 01/21/18  3:47 PM  Result Value Ref Range   Yeast Wet Prep HPF POC NONE SEEN NONE SEEN   Trich, Wet Prep NONE SEEN NONE SEEN   Clue Cells Wet Prep HPF  POC NONE SEEN NONE SEEN   WBC, Wet Prep HPF POC NONE SEEN NONE SEEN   Sperm NONE SEEN    MAU Course  Procedures Toradol  MDM Labs ordered and reviewed. No evidence of PID. Pt reports last use of tampon was 2 mos ago and was unaware of tampon in her vagina today. Pain improved after meds. Recommend f/u with Dr. Hulan Fray to discuss options for menorrhagia/dysmenorrhea. Stable for discharge home.  Assessment and Plan   1. Retained tampon, initial encounter   2. Menorrhagia with regular cycle   3. Dysmenorrhea    Discharge home Follow up in [place] in 1-2 weeks Rx Naproxen prn  Allergies as of 01/21/2018   No Known Allergies     Medication List    TAKE these medications   amLODipine 10 MG tablet Commonly known as:  NORVASC Take 1 tablet (10 mg total) by mouth daily.   hydrochlorothiazide 25 MG tablet Commonly known as:  HYDRODIURIL Take 1 tablet (25 mg total) by mouth daily.   naproxen 500 MG tablet Commonly known as:  NAPROSYN Take 1 tablet (500 mg total) by mouth 2 (two) times daily as needed.      Julianne Handler, CNM 01/21/2018, 5:08 PM

## 2018-01-21 NOTE — Discharge Instructions (Signed)
Dysmenorrhea Dysmenorrhea means painful cramps during your period (menstrual period). You will have pain in your lower belly (abdomen). The pain is caused by the tightening (contracting) of the muscles of the womb (uterus). The pain may be mild or very bad. With this condition, you may:  Have a headache.  Feel sick to your stomach (nauseous).  Throw up (vomit).  Have lower back pain.  Follow these instructions at home: Helping pain and cramping  Put heat on your lower back or belly when you have pain or cramps. Use the heat source that your doctor tells you to use. ? Place a towel between your skin and the heat. ? Leave the heat on for 20-30 minutes. ? Remove the heat if your skin turns bright red. This is especially important if you cannot feel pain, heat, or cold. ? Do not have a heating pad on during sleep.  Do aerobic exercises. These include walking, swimming, or biking. These may help with cramps.  Massage your lower back or belly. This may help lessen pain. General instructions  Take over-the-counter and prescription medicines only as told by your doctor.  Do not drive or use heavy machinery while taking prescription pain medicine.  Avoid alcohol and caffeine during and right before your period. These can make cramps worse.  Do not use any products that have nicotine or tobacco. These include cigarettes and e-cigarettes. If you need help quitting, ask your doctor.  Keep all follow-up visits as told by your doctor. This is important. Contact a doctor if:  You have pain that gets worse.  You have pain that does not get better with medicine.  You have pain during sex.  You feel sick to your stomach or you throw up during your period, and medicine does not help. Get help right away if:  You pass out (faint). Summary  Dysmenorrhea means painful cramps during your period (menstrual period).  Put heat on your lower back or belly when you have pain or cramps.  Do  exercises like walking, swimming, or biking to help with cramps.  Contact a doctor if you have pain during sex. This information is not intended to replace advice given to you by your health care provider. Make sure you discuss any questions you have with your health care provider. Document Released: 11/02/2008 Document Revised: 08/23/2016 Document Reviewed: 08/23/2016 Elsevier Interactive Patient Education  2017 [employer]  Menorrhagia Menorrhagia is when your menstrual periods are heavy or last longer than usual. Follow these instructions at home:  Only take medicine as told by your doctor.  Take any iron pills as told by your doctor. Heavy bleeding may cause low levels of iron in your body.  Do not take aspirin 1 week before or during your period. Aspirin can make the bleeding worse.  Lie down for a while if you change your tampon or pad more than once in 2 hours. This may help lessen the bleeding.  Eat a healthy diet and foods with iron. These foods include leafy green vegetables, meat, liver, eggs, and whole grain breads and cereals.  Do not try to lose weight. Wait until the heavy bleeding has stopped and your iron level is normal. Contact a doctor if:  You soak through a pad or tampon every 1 or 2 hours, and this happens every time you have a period.  You need to use pads and tampons at the same time because you are bleeding so much.  You need to change your pad  or tampon during the night.  You have a period that lasts for more than 8 days.  You pass clots bigger than 1 inch (2.5 cm) wide.  You have irregular periods that happen more or less often than once a month.  You feel dizzy or pass out (faint).  You feel very weak or tired.  You feel short of breath or feel your heart is beating too fast when you exercise.  You feel sick to your stomach (nausea) and you throw up (vomit) while you are taking your medicine.  You have watery poop (diarrhea) while you are  taking your medicine.  You have any problems that may be related to the medicine you are taking. Get help right away if:  You soak through 4 or more pads or tampons in 2 hours.  You have any bleeding while you are pregnant. This information is not intended to replace advice given to you by your health care provider. Make sure you discuss any questions you have with your health care provider. Document Released: 05/15/2008 Document Revised: 01/12/2016 Document Reviewed: 02/05/2013 Elsevier Interactive Patient Education  2017 [employer].

## 2018-01-21 NOTE — MAU Note (Signed)
Pt states that her cycle ended ;ast Monday. She states that during the last 3 days of it she had brown blood that smelled like trash. She then states that it went away for 2-3 days and then returned. She is now having bright red bleeding with a foul odor.   She is also having cramping rating the pain 9/10 intermittent.   She also states that she is extremely fatigued.

## 2018-01-22 LAB — GC/CHLAMYDIA PROBE AMP (~~LOC~~) NOT AT ARMC
CHLAMYDIA, DNA PROBE: NEGATIVE
NEISSERIA GONORRHEA: NEGATIVE

## 2018-04-23 ENCOUNTER — Other Ambulatory Visit: Payer: Self-pay | Admitting: Family Medicine

## 2018-04-23 DIAGNOSIS — I10 Essential (primary) hypertension: Secondary | ICD-10-CM

## 2018-05-02 ENCOUNTER — Other Ambulatory Visit: Payer: Self-pay | Admitting: Family Medicine

## 2018-05-02 DIAGNOSIS — I10 Essential (primary) hypertension: Secondary | ICD-10-CM

## 2018-05-06 ENCOUNTER — Telehealth: Payer: Self-pay

## 2018-05-06 NOTE — Telephone Encounter (Signed)
Patient was advised that she needs a appointment and is scheduled for 05/12/2018.

## 2018-05-12 ENCOUNTER — Ambulatory Visit: Payer: Self-pay | Admitting: Family Medicine

## 2018-05-14 ENCOUNTER — Emergency Department (HOSPITAL_COMMUNITY): Payer: Self-pay

## 2018-05-14 ENCOUNTER — Emergency Department (HOSPITAL_COMMUNITY)
Admission: EM | Admit: 2018-05-14 | Discharge: 2018-05-14 | Disposition: A | Discharge status: Home / Self Care | Payer: Self-pay | Attending: Emergency Medicine | Admitting: Emergency Medicine

## 2018-05-14 ENCOUNTER — Encounter (HOSPITAL_COMMUNITY): Payer: Self-pay | Admitting: Neurology

## 2018-05-14 DIAGNOSIS — F17210 Nicotine dependence, cigarettes, uncomplicated: Secondary | ICD-10-CM | POA: Insufficient documentation

## 2018-05-14 DIAGNOSIS — Z79899 Other long term (current) drug therapy: Secondary | ICD-10-CM | POA: Insufficient documentation

## 2018-05-14 DIAGNOSIS — I10 Essential (primary) hypertension: Secondary | ICD-10-CM | POA: Insufficient documentation

## 2018-05-14 LAB — I-STAT TROPONIN, ED
Troponin i, poc: 0 ng/mL (ref 0.00–0.08)
Troponin i, poc: 0 ng/mL (ref 0.00–0.08)

## 2018-05-14 LAB — I-STAT BETA HCG BLOOD, ED (MC, WL, AP ONLY)

## 2018-05-14 LAB — CBC
HCT: 35.2 % — ABNORMAL LOW (ref 36.0–46.0)
Hemoglobin: 10.8 g/dL — ABNORMAL LOW (ref 12.0–15.0)
MCH: 27.1 pg (ref 26.0–34.0)
MCHC: 30.7 g/dL (ref 30.0–36.0)
MCV: 88.4 fL (ref 78.0–100.0)
PLATELETS: 300 10*3/uL (ref 150–400)
RBC: 3.98 MIL/uL (ref 3.87–5.11)
RDW: 16.1 % — AB (ref 11.5–15.5)
WBC: 5.2 10*3/uL (ref 4.0–10.5)

## 2018-05-14 LAB — BASIC METABOLIC PANEL
Anion gap: 8 (ref 5–15)
BUN: 9 mg/dL (ref 6–20)
CO2: 24 mmol/L (ref 22–32)
CREATININE: 0.63 mg/dL (ref 0.44–1.00)
Calcium: 9.3 mg/dL (ref 8.9–10.3)
Chloride: 106 mmol/L (ref 98–111)
GFR calc Af Amer: >60 mL/min (ref >60)
Glucose, Bld: 92 mg/dL (ref 70–99)
Potassium: 4.1 mmol/L (ref 3.5–5.1)
SODIUM: 138 mmol/L (ref 135–145)

## 2018-05-14 MED ORDER — HYDROCHLOROTHIAZIDE 25 MG PO TABS: 25.0000 mg | ORAL_TABLET | Freq: Every day | ORAL | 0 refills | Status: DC

## 2018-05-14 MED ORDER — AMLODIPINE BESYLATE 10 MG PO TABS: 10.0000 mg | ORAL_TABLET | Freq: Every day | ORAL | 0 refills | Status: DC

## 2018-05-14 MED ORDER — ACETAMINOPHEN 325 MG PO TABS
650.0000 mg | ORAL_TABLET | Freq: Once | ORAL | Status: AC
  Administered 2018-05-14: 650 mg via ORAL
  Filled 2018-05-14: qty 2

## 2018-05-14 NOTE — ED Provider Notes (Signed)
[facility] Provider Note   CSN: [id number] Arrival date & time: 05/14/18  0909     History   Chief Complaint Chief Complaint  Patient presents with  . Hypertension    HPI Becky Koch is a 47 y.o. female.  The history is provided by the patient. No language interpreter was used.  Hypertension      48 year old female with hx of HTN presenting with concern for high blood pressure.  Patient states she felt that her blood pressure is high because she was having some mild occipital headache, occasional tightness sensation to both hands, some chest discomfort with heart palpitation which started yesterday.  Symptom is waxing waning and no specific symptoms currently at this time.  Her chest discomfort is without associate nausea, diaphoresis, lightheadedness or shortness of breath.  She did report having increasing stress after her son recently moved back in with her.  She did endorse having a "heated conversation" with her son yesterday when his symptoms started.  She also admits to using tobacco infrequently as well as drinking alcohol infrequently, last use was yesterday.  States occasional marijuana use.  She denies any prior cardiac work-up.  She denies any associated fever, chills, productive cough, abdominal pain or back pain.  She did report she ran out of her blood pressure medication approximately a week ago which includes hydrochlorothiazide and Norvasc.  She is currently at a new job that does not have insurance and her friend did recommend patient to come to the ED to help get medication refill and find a primary care provider.   Past Medical History:  Diagnosis Date  . Fibroid, uterine   . Hypertension     Patient Active Problem List   Diagnosis Date Noted  . Actinic keratoses 05/02/2017  . Dysmenorrhea 07/14/2012  . Menorrhagia 07/14/2012  . History of uterine fibroid 07/14/2012  . HTN (hypertension) 07/14/2012    Past  Surgical History:  Procedure Laterality Date  . TUBAL LIGATION       OB History    Gravida  8   Para  4   Term  4   Preterm      AB  4   Living  4     SAB  1   TAB  3   Ectopic      Multiple      Live Births               Home Medications    Prior to Admission medications   Medication Sig Start Date End Date Taking? Authorizing Provider  amLODipine (NORVASC) 10 MG tablet Take 1 tablet (10 mg total) by mouth daily. 03/22/17   Scot Jun, FNP  hydrochlorothiazide (HYDRODIURIL) 25 MG tablet Take 1 tablet (25 mg total) by mouth daily. 03/22/17   Scot Jun, FNP  naproxen (NAPROSYN) 500 MG tablet Take 1 tablet (500 mg total) by mouth 2 (two) times daily as needed. 01/21/18   Julianne Handler, CNM    Family History Family History  Problem Relation Age of Onset  . Cancer Paternal Grandfather     Social History Social History   Tobacco Use  . Smoking status: Current Every Day Smoker    Packs/day: 0.50    Types: Cigarettes  . Smokeless tobacco: Never Used  Substance Use Topics  . Alcohol use: Yes    Comment: social alcohol  . Drug use: No     Allergies   Patient  has no known allergies.   Review of Systems Review of Systems  All other systems reviewed and are negative.    Physical Exam Updated Vital Signs BP (!) 143/101 (BP Location: Right Arm)   Pulse 83   Temp 99 F (37.2 C) (Oral)   Resp 18   Ht 5' 5.5" (1.664 m)   Wt 66.7 kg   LMP 05/14/2018   SpO2 100%   BMI 24.09 kg/m   Physical Exam  Constitutional: She is oriented to person, place, and time. She appears well-developed and well-nourished. No distress.  HENT:  Head: Atraumatic.  Eyes: Conjunctivae are normal.  Neck: Neck supple. No JVD present.  Cardiovascular: Normal rate and regular rhythm.  Pulmonary/Chest: Effort normal and breath sounds normal.  Abdominal: Soft. She exhibits no distension. There is no tenderness.  Musculoskeletal: She exhibits no edema.    Neurological: She is alert and oriented to person, place, and time.  Skin: No rash noted.  Psychiatric: She has a normal mood and affect.  Nursing note and vitals reviewed.    ED Treatments / Results  Labs (all labs ordered are listed, but only abnormal results are displayed) Labs Reviewed  CBC - Abnormal; Notable for the following components:      Result Value   Hemoglobin 10.8 (*)    HCT 35.2 (*)    RDW 16.1 (*)    All other components within normal limits  BASIC METABOLIC PANEL  I-STAT TROPONIN, ED  I-STAT BETA HCG BLOOD, ED ([facility], [facility], [facility] ONLY)  I-STAT TROPONIN, ED    EKG None  ED ECG REPORT   Date: 05/14/2018  Rate: 81  Rhythm: normal sinus rhythm  QRS Axis: normal  Intervals: normal  ST/T Wave abnormalities: nonspecific ST/T changes  Conduction Disutrbances:none  Narrative Interpretation:   Old EKG Reviewed: unchanged  I have personally reviewed the EKG tracing and agree with the computerized printout as noted.   Radiology Dg Chest 2 View  Result Date: 05/14/2018 CLINICAL DATA:  Chest pain since yesterday EXAM: CHEST - 2 VIEW COMPARISON:  November 14, 2012 FINDINGS: The heart size and mediastinal contours are within normal limits. Both lungs are clear. The visualized skeletal structures are unremarkable. IMPRESSION: No active cardiopulmonary disease. Electronically Signed   By: Abelardo Diesel M.D.   On: 05/14/2018 10:34    Procedures Procedures (including critical care time)  Medications Ordered in ED Medications  acetaminophen (TYLENOL) tablet 650 mg (has no administration in time range)     Initial Impression / Assessment and Plan / ED Course  I have reviewed the triage vital signs and the nursing notes.  Pertinent labs & imaging results that were available during my care of the patient were reviewed by me and considered in my medical decision making (see chart for details).     BP (!) 143/101 (BP Location: Right Arm)   Pulse 83   Temp 99 F (37.2  C) (Oral)   Resp 18   Ht 5' 5.5" (1.664 m)   Wt 66.7 kg   LMP 05/14/2018   SpO2 100%   BMI 24.09 kg/m    Final Clinical Impressions(s) / ED Diagnoses   Final diagnoses:  Hypertension, unspecified type    ED Discharge Orders         Ordered    amLODipine (NORVASC) 10 MG tablet  Daily    Note to Pharmacy:  Change to 10 with next refill. She is to take 2 5 mg until current supply gone.  05/14/18 1233    hydrochlorothiazide (HYDRODIURIL) 25 MG tablet  Daily     05/14/18 1233         10:23 AM Patient here voice concern of high blood pressure after she ran out of blood pressure medication for approximately a week.  She does not have any significant concerning symptoms at this time.  She is resting comfortably, well-appearing.  Blood pressure is elevated at 397 systolic on the monitor.  She would benefit from a refill of her medication as well as referral and resources for primary care provider.  Will perform screening labs and chest x-ray at this time.  3:09 PM Negative delta troponin, pregnancy test is negative, electrolytes panels are reassuring, normal WBC, hemoglobin is 10.8, chest x-ray unremarkable.  At this time, patient is stable for discharge.  No acute emergent medical condition identified.  Her discomfort is not likely to be hypertensive emergency or ACS.  Encourage patient to follow-up with primary care provider for further management of her health.  We will refill her blood pressure medication.   Domenic Moras, PA-C 05/14/18 1510    Mesner, Corene Cornea, MD 05/14/18 [phone]

## 2018-05-14 NOTE — ED Notes (Signed)
Patient verbalizes understanding of discharge instructions. Opportunity for questioning and answers were provided. Armband removed by staff, pt discharged from ED.  

## 2018-05-14 NOTE — ED Triage Notes (Signed)
Pt reports HTN, ran out of HCTZ, norvasc last week, does not have insurance needs rx. Would like referral to outpatient clinic for primary care needs. Does report h/a, and sharp pain to left side of head, also blurry vision. Reports CP and SOB last night with fluttering. Is a x 4.

## 2018-05-14 NOTE — ED Notes (Signed)
Pt gone to room before I could draw labs.

## 2018-06-04 ENCOUNTER — Ambulatory Visit: Payer: Self-pay | Admitting: Obstetrics & Gynecology

## 2018-06-04 ENCOUNTER — Encounter: Payer: Self-pay | Admitting: Obstetrics & Gynecology

## 2018-06-04 VITALS — BP 124/85 | HR 89 | Wt 148.3 lb

## 2018-06-04 DIAGNOSIS — D251 Intramural leiomyoma of uterus: Secondary | ICD-10-CM

## 2018-06-04 DIAGNOSIS — Z0000 Encounter for general adult medical examination without abnormal findings: Secondary | ICD-10-CM

## 2018-06-04 MED ORDER — MEGESTROL ACETATE 40 MG PO TABS: 40.0000 mg | ORAL_TABLET | Freq: Two times a day (BID) | ORAL | 5 refills | Status: DC

## 2018-06-04 NOTE — Progress Notes (Signed)
   Subjective:    Patient ID: Becky Koch, female    DOB: 07/08/1971, 47 y.o.   MRN: [id number]  HPI 47 yo married P4 (8 grands) here today because she is still having heavy painful periods every month, lasting about 7 days. She takes IBU for the pain. The cramping starts a week prior to starting periods, lasts through the period and then for another few days. She had an u/s last year that showed small fibroids. Her HBG last month was 10.2. She is taking iron pills. She tried an IUD for the bleeding which helped the bleeding but the cramping was bad.   Review of Systems She has had a BTL.  She has night sweats, mood swings.  Objective:   Physical Exam  Breathing, conversing, and ambulating normally Well nourished, well hydrated Black female, no apparent distress Abd- benign      Assessment & Plan:  Menorrhagia with dysmenorrhea Start with megace since depo provera will cost her $200 today Get repeat gyn u/s Consider ablation based on fibroid positon Come back in 3 weeks She will get her pap smear [facility] tomorrow night.

## 2018-06-18 ENCOUNTER — Other Ambulatory Visit: Payer: Self-pay | Admitting: Family Medicine

## 2018-06-18 DIAGNOSIS — I10 Essential (primary) hypertension: Secondary | ICD-10-CM

## 2018-06-18 NOTE — Telephone Encounter (Signed)
Patient called to see if she could get refills for her  Amlodipine Hydrodiuril Until her next visit. She would like it sent to [facility] on [address].

## 2018-06-18 NOTE — Telephone Encounter (Signed)
Patient seen 03/22/17, has appt scheduled on 06/23/18 with Dr. Joya Gaskins for a hospital follow-up. Please refill if appropriate.

## 2018-06-19 MED ORDER — HYDROCHLOROTHIAZIDE 25 MG PO TABS: 25.0000 mg | ORAL_TABLET | Freq: Every day | ORAL | 0 refills | Status: DC

## 2018-06-19 MED ORDER — AMLODIPINE BESYLATE 10 MG PO TABS: 10.0000 mg | ORAL_TABLET | Freq: Every day | ORAL | 0 refills | Status: DC

## 2018-06-23 ENCOUNTER — Ambulatory Visit: Payer: Self-pay | Attending: Critical Care Medicine | Admitting: Critical Care Medicine

## 2018-06-23 ENCOUNTER — Encounter: Payer: Self-pay | Admitting: Critical Care Medicine

## 2018-06-23 DIAGNOSIS — Z720 Tobacco use: Secondary | ICD-10-CM | POA: Insufficient documentation

## 2018-06-23 DIAGNOSIS — Z79899 Other long term (current) drug therapy: Secondary | ICD-10-CM | POA: Insufficient documentation

## 2018-06-23 DIAGNOSIS — F17210 Nicotine dependence, cigarettes, uncomplicated: Secondary | ICD-10-CM | POA: Insufficient documentation

## 2018-06-23 DIAGNOSIS — I10 Essential (primary) hypertension: Secondary | ICD-10-CM | POA: Insufficient documentation

## 2018-06-23 DIAGNOSIS — N946 Dysmenorrhea, unspecified: Secondary | ICD-10-CM | POA: Insufficient documentation

## 2018-06-23 MED ORDER — NAPROXEN 500 MG PO TABS: 500.0000 mg | ORAL_TABLET | Freq: Two times a day (BID) | ORAL | 6 refills | Status: DC | PRN

## 2018-06-23 MED ORDER — FERROUS SULFATE 325 (65 FE) MG PO TABS: 325.0000 mg | ORAL_TABLET | Freq: Two times a day (BID) | ORAL | 6 refills | Status: DC

## 2018-06-23 MED ORDER — NICOTINE 14 MG/24HR TD PT24: 14.0000 mg | MEDICATED_PATCH | Freq: Every day | TRANSDERMAL | 0 refills | Status: DC

## 2018-06-23 MED ORDER — AMLODIPINE BESYLATE 10 MG PO TABS: 10.0000 mg | ORAL_TABLET | Freq: Every day | ORAL | 6 refills | Status: DC

## 2018-06-23 MED ORDER — HYDROCHLOROTHIAZIDE 25 MG PO TABS: 25.0000 mg | ORAL_TABLET | Freq: Every day | ORAL | 6 refills | Status: DC

## 2018-06-23 NOTE — Assessment & Plan Note (Signed)
History of menorrhagia and dysmenorrhea, question of postmenopausal syndrome with hot flashes sweats and mood change Plan The patient needs to return for a Pap smear with primary care and I asked the patient to address those symptoms at that visit  The patient will also address the symptoms with her upcoming visit with gynecology

## 2018-06-23 NOTE — Patient Instructions (Signed)
Refills on amlodipine and hydrochlorthiazide were sent to our pharmacy Refills on naproxen were sent as well to the pharmacy Begin nicotine patch 14 mg apply to skin daily for 28 days and then from there reduced to 7 mg patch daily A referral to financial counselor will be given Return within 2 weeks to see primary care with Pap smear Flu vaccine will be given

## 2018-06-23 NOTE — Progress Notes (Signed)
Subjective:    Patient ID: Becky Koch, female    DOB: 1970/11/24, 47 y.o.   MRN: [id number]  This is a 47 year old female seen today for follow-up of hypertension.  The patient was seen May 14, 2018  in the emergency room for hypertension when she had run out of her medications for a week.  She had her amlodipine dose increased to 10 mg daily and hydrochlorthiazide 25 mg daily was maintained.  These medications were refilled.  She is here today for post emergency room follow-up.      Hypertension  This is a chronic problem. The current episode started more than 1 year ago (dx 2013). The problem has been rapidly improving since onset. Associated symptoms include anxiety, blurred vision, headaches, palpitations and sweats. Pertinent negatives include no chest pain, malaise/fatigue, neck pain, orthopnea, peripheral edema, PND or shortness of breath. Risk factors for coronary artery disease include smoking/tobacco exposure and family history. Past treatments include calcium channel blockers and diuretics. There are no compliance problems.  There is no history of angina, kidney disease or CAD/MI.   Past Medical History:  Diagnosis Date  . Fibroid, uterine   . Hypertension      Family History  Problem Relation Age of Onset  . Cancer Paternal Grandfather      Social History   Socioeconomic History  . Marital status: Legally Separated    Spouse name: Not on file  . Number of children: Not on file  . Years of education: Not on file  . Highest education level: Not on file  Occupational History  . Not on file  Social Needs  . Financial resource strain: Not on file  . Food insecurity:    Worry: Not on file    Inability: Not on file  . Transportation needs:    Medical: Not on file    Non-medical: Not on file  Tobacco Use  . Smoking status: Current Every Day Smoker    Packs/day: 0.50    Types: Cigarettes  . Smokeless tobacco: Never Used  Substance and Sexual Activity  .  Alcohol use: Yes    Comment: social alcohol  . Drug use: No  . Sexual activity: Yes    Birth control/protection: Surgical  Lifestyle  . Physical activity:    Days per week: Not on file    Minutes per session: Not on file  . Stress: Not on file  Relationships  . Social connections:    Talks on phone: Not on file    Gets together: Not on file    Attends religious service: Not on file    Active member of club or organization: Not on file    Attends meetings of clubs or organizations: Not on file    Relationship status: Not on file  . Intimate partner violence:    Fear of current or ex partner: Not on file    Emotionally abused: Not on file    Physically abused: Not on file    Forced sexual activity: Not on file  Other Topics Concern  . Not on file  Social History Narrative  . Not on file     No Known Allergies   Outpatient Medications Prior to Visit  Medication Sig Dispense Refill  . ibuprofen (ADVIL,MOTRIN) 200 MG tablet Take 800 mg by mouth as needed for headache or moderate pain.    . megestrol (MEGACE) 40 MG tablet Take 1 tablet (40 mg total) by mouth 2 (two) times daily. [place]  tablet 5  . amLODipine (NORVASC) 10 MG tablet Take 1 tablet (10 mg total) by mouth daily. 30 tablet 0  . ferrous sulfate 325 (65 FE) MG tablet Take 325 mg by mouth 2 (two) times daily with a meal.    . hydrochlorothiazide (HYDRODIURIL) 25 MG tablet Take 1 tablet (25 mg total) by mouth daily. 30 tablet 0  . naproxen (NAPROSYN) 500 MG tablet Take 1 tablet (500 mg total) by mouth 2 (two) times daily as needed. 30 tablet 0   No facility-administered medications prior to visit.       Review of Systems  Constitutional: Negative for malaise/fatigue.  Eyes: Positive for blurred vision.  Respiratory: Negative for shortness of breath.   Cardiovascular: Positive for palpitations. Negative for chest pain, orthopnea and PND.  Musculoskeletal: Negative for neck pain.  Neurological: Positive for headaches.        Objective:   Physical Exam Vitals:   06/23/18 1348  BP: 123/84  Pulse: 81  Resp: 18  Temp: 98.4 F (36.9 C)  TempSrc: Oral  SpO2: 99%  Weight: 144 lb 3.2 oz (65.4 kg)  Height: 5\' 4"  (1.626 m)    Gen: Pleasant, well-nourished, in no distress,  normal affect  ENT: No lesions,  mouth clear,  oropharynx clear, no postnasal drip  Neck: No JVD, no TMG, no carotid bruits  Lungs: No use of accessory muscles, no dullness to percussion, clear without rales or rhonchi  Cardiovascular: RRR, heart sounds normal, no murmur or gallops, no peripheral edema  Abdomen: soft and NT, no HSM,  BS normal  Musculoskeletal: No deformities, no cyanosis or clubbing  Neuro: alert, non focal  Skin: Warm, no lesions or rashes  No results found.        Assessment & Plan:  I personally reviewed all images and lab data in the [facility] system as well as any outside material available during this office visit and agree with the  radiology impressions.   HTN (hypertension) Essential hypertension stable at this time on current medication profile Plan Continue amlodipine 10 mg daily Continue hydrochlorthiazide 25 mg daily Follow-up with primary care  Dysmenorrhea History of menorrhagia and dysmenorrhea, question of postmenopausal syndrome with hot flashes sweats and mood change Plan The patient needs to return for a Pap smear with primary care and I asked the patient to address those symptoms at that visit  The patient will also address the symptoms with her upcoming visit with gynecology  Tobacco use Ongoing tobacco use Plan 10 minutes of smoking cessation counseling was given to the patient I recommend use of nicotine patch 14 mg daily for 1 month then reduce to 7 mg daily from there   [place] was seen today for hypertension.  Diagnoses and all orders for this visit:  Essential hypertension -     hydrochlorothiazide (HYDRODIURIL) 25 MG tablet; Take 1 tablet (25 mg total) by mouth  daily. -     amLODipine (NORVASC) 10 MG tablet; Take 1 tablet (10 mg total) by mouth daily.  Dysmenorrhea  Tobacco use  Other orders -     naproxen (NAPROSYN) 500 MG tablet; Take 1 tablet (500 mg total) by mouth 2 (two) times daily as needed. -     ferrous sulfate 325 (65 FE) MG tablet; Take 1 tablet (325 mg total) by mouth 2 (two) times daily with a meal. -     nicotine (NICODERM CQ - DOSED IN MG/24 HOURS) 14 mg/24hr patch; Place 1 patch (14 mg total) onto  the skin daily. After 28 days of 14mg  patch, then start 7mg  daily    I had an extended discussion with the patient and or family lasting 10 minutes of a 25 minute visit including: Regarding smoking cessation counseling

## 2018-06-23 NOTE — Assessment & Plan Note (Signed)
Essential hypertension stable at this time on current medication profile Plan Continue amlodipine 10 mg daily Continue hydrochlorthiazide 25 mg daily Follow-up with primary care

## 2018-06-23 NOTE — Assessment & Plan Note (Signed)
Ongoing tobacco use Plan 10 minutes of smoking cessation counseling was given to the patient I recommend use of nicotine patch 14 mg daily for 1 month then reduce to 7 mg daily from there

## 2018-06-25 ENCOUNTER — Ambulatory Visit: Payer: Self-pay | Admitting: Obstetrics & Gynecology

## 2018-07-04 ENCOUNTER — Ambulatory Visit: Payer: Self-pay

## 2018-07-08 ENCOUNTER — Ambulatory Visit: Payer: Self-pay | Admitting: Family Medicine

## 2018-07-11 ENCOUNTER — Other Ambulatory Visit (HOSPITAL_COMMUNITY): Payer: Self-pay | Admitting: *Deleted

## 2018-07-11 DIAGNOSIS — Z1231 Encounter for screening mammogram for malignant neoplasm of breast: Secondary | ICD-10-CM

## 2018-07-21 ENCOUNTER — Ambulatory Visit: Payer: Self-pay | Admitting: Obstetrics & Gynecology

## 2018-07-24 MED FILL — HYDROCHLOROTHIAZIDE 25 MG T: 25 | 30 days supply | Qty: 30 | Fill #0

## 2018-07-24 MED FILL — AMLODIPINE BESYLATE 10 MG T: 10 | 30 days supply | Qty: 30 | Fill #0

## 2018-07-31 ENCOUNTER — Ambulatory Visit: Payer: Self-pay | Attending: Family Medicine | Admitting: Family Medicine

## 2018-07-31 ENCOUNTER — Other Ambulatory Visit: Payer: Self-pay

## 2018-07-31 ENCOUNTER — Encounter: Payer: Self-pay | Admitting: Family Medicine

## 2018-07-31 VITALS — BP 122/85 | HR 93 | Temp 98.5°F | Resp 18 | Ht 65.0 in | Wt 152.0 lb

## 2018-07-31 DIAGNOSIS — D500 Iron deficiency anemia secondary to blood loss (chronic): Secondary | ICD-10-CM

## 2018-07-31 DIAGNOSIS — F17210 Nicotine dependence, cigarettes, uncomplicated: Secondary | ICD-10-CM

## 2018-07-31 DIAGNOSIS — D219 Benign neoplasm of connective and other soft tissue, unspecified: Secondary | ICD-10-CM

## 2018-07-31 DIAGNOSIS — I10 Essential (primary) hypertension: Secondary | ICD-10-CM

## 2018-07-31 DIAGNOSIS — R229 Localized swelling, mass and lump, unspecified: Secondary | ICD-10-CM

## 2018-07-31 DIAGNOSIS — Z124 Encounter for screening for malignant neoplasm of cervix: Secondary | ICD-10-CM

## 2018-07-31 DIAGNOSIS — Z720 Tobacco use: Secondary | ICD-10-CM

## 2018-07-31 DIAGNOSIS — D259 Leiomyoma of uterus, unspecified: Secondary | ICD-10-CM

## 2018-07-31 DIAGNOSIS — R222 Localized swelling, mass and lump, trunk: Secondary | ICD-10-CM

## 2018-07-31 DIAGNOSIS — R002 Palpitations: Secondary | ICD-10-CM

## 2018-07-31 MED FILL — NICOTINE 14 MG/24HR PATCH: 14 | 28 days supply | Qty: 28 | Fill #0

## 2018-07-31 NOTE — Patient Instructions (Signed)
Palpitations A palpitation is the feeling that your heart:  Has an uneven (irregular) heartbeat.  Is beating faster than normal.  Is fluttering.  Is skipping a beat.  This is usually not a serious problem. In some cases, you may need more medical tests. Follow these instructions at home:  Avoid: ? Caffeine in coffee, tea, soft drinks, diet pills, and energy drinks. ? Chocolate. ? Alcohol.  Do not use any tobacco products. These include cigarettes, chewing tobacco, and e-cigarettes. If you need help quitting, ask your doctor.  Try to reduce your stress. These things may help: ? Yoga. ? Meditation. ? Physical activity. Swimming, jogging, and walking are good choices. ? A method that helps you use your mind to control things in your body, like heartbeats (biofeedback).  Get plenty of rest and sleep.  Take over-the-counter and prescription medicines only as told by your doctor.  Keep all follow-up visits as told by your doctor. This is important. Contact a doctor if:  Your heartbeat is still fast or uneven after 24 hours.  Your palpitations occur more often. Get help right away if:  You have chest pain.  You feel short of breath.  You have a very bad headache.  You feel dizzy.  You pass out (faint). This information is not intended to replace advice given to you by your health care provider. Make sure you discuss any questions you have with your health care provider. Document Released: 05/15/2008 Document Revised: 01/12/2016 Document Reviewed: 04/21/2015 Elsevier Interactive Patient Education  2018 [employer]  

## 2018-07-31 NOTE — Progress Notes (Signed)
Subjective:    Patient ID: Becky Koch, female    DOB: [birth year], 47 y.o.   MRN: [id number]  HPI       47 yo female new to me as a patient who is seen in follow-up of hypertension. Patient was seen here on 06/23/2018 by another provider and follow-up of hypertension for which she had been seen in the emergency department on 05/14/2018 after running out of her blood pressure medication.  During her ED visit, her amlodipine was increased from 5mg  to 10 mg and patient was provided with a refill of her hydrochlorothiazide.  At today's visit, patient denies any headaches or dizziness related to her hypertension and states she is taking her medication regularly.  Patient however on review of systems does have complaint of palpitations.  Patient states that at times, usually at night after she is in bed, she will feel the sensation that her heart is shaking and she sometimes feels lightheaded when this is occurring.  Patient reports she does take iron for anemia but has been out of her iron for the past 2 weeks.      Patient states that her anemia is secondary to fibroids for which she has had heavy bleeding and patient is currently on Megace for control of the bleeding secondary to the fibroids.  Patient denies any current abdominal pain but has had some right-sided low back pain which she believes may be related to her fibroids.  Patient states that she needs a Pap smear done as she did not have this done by her GYN.  Patient states that she does have some mild menstrual-like bleeding which she notices with wiping but states that this is daily and she does not feel that this represents her current menses.      Patient also reports that approximately 1 year ago she had a biopsy of a skin area on her upper back/posterior shoulder area and she was told that this was precancerous and she would need follow-up however patient is uninsured and cannot afford follow-up at this time.  Patient also with complaint of  feeling a small nodule on her left mid abdomen and she states that she believes that this area has gotten slightly bigger in size and is tender to touch.  Past Medical History:  Diagnosis Date  . Fibroid, uterine   . Hypertension    Past Surgical History:  Procedure Laterality Date  . TUBAL LIGATION     Family History  Problem Relation Age of Onset  . Cancer Paternal Grandfather    Social History   Tobacco Use  . Smoking status: Current Every Day Smoker    Packs/day: 0.50    Types: Cigarettes  . Smokeless tobacco: Never Used  Substance Use Topics  . Alcohol use: Yes    Comment: social alcohol  . Drug use: No  No Known Allergies  Current Outpatient Medications on File Prior to Visit  Medication Sig Dispense Refill  . amLODipine (NORVASC) 10 MG tablet Take 1 tablet (10 mg total) by mouth daily. 30 tablet 6  . ferrous sulfate 325 (65 FE) MG tablet Take 1 tablet (325 mg total) by mouth 2 (two) times daily with a meal. 60 tablet 6  . hydrochlorothiazide (HYDRODIURIL) 25 MG tablet Take 1 tablet (25 mg total) by mouth daily. 30 tablet 6  . ibuprofen (ADVIL,MOTRIN) 200 MG tablet Take 800 mg by mouth as needed for headache or moderate pain.    . megestrol (MEGACE) 40 MG  tablet Take 1 tablet (40 mg total) by mouth 2 (two) times daily. 60 tablet 5  . naproxen (NAPROSYN) 500 MG tablet Take 1 tablet (500 mg total) by mouth 2 (two) times daily as needed. 60 tablet 6  . nicotine (NICODERM CQ - DOSED IN MG/24 HOURS) 14 mg/24hr patch Place 1 patch (14 mg total) onto the skin daily. After 28 days of 14mg  patch, then start 7mg  daily 28 patch 0   No current facility-administered medications on file prior to visit.      Review of Systems  Constitutional: Positive for fatigue. Negative for chills and fever.  HENT: Negative for congestion, sore throat and trouble swallowing.   Respiratory: Negative for cough and shortness of breath.   Cardiovascular: Positive for palpitations. Negative for  chest pain and leg swelling.  Gastrointestinal: Positive for abdominal distention. Negative for abdominal pain.  Endocrine: Negative for polydipsia, polyphagia and polyuria.  Genitourinary: Positive for menstrual problem. Negative for dysuria, frequency, pelvic pain and vaginal pain.  Musculoskeletal: Positive for back pain. Negative for gait problem.  Neurological: Positive for light-headedness (With onset of palpitations). Negative for dizziness and headaches.  Hematological: Negative for adenopathy. Does not bruise/bleed easily.       Objective:   Physical Exam BP 122/85 (BP Location: Left Arm, Patient Position: Sitting, Cuff Size: Normal)   Pulse 93   Temp 98.5 F (36.9 C) (Oral)   Resp 18   Ht 5\' 5"  (1.651 m)   Wt 152 lb (68.9 kg)   LMP 05/31/2018   SpO2 98%   BMI 25.29 kg/m Nurse's notes and vital signs reviewed General-well-nourished, well-developed female in no acute distress Neck-supple, no lymphadenopathy, no carotid bruit, patient with borderline thyromegaly Lungs-clear to auscultation bilaterally Cardiovascular-regular rate and rhythm Abdomen-soft, nontender, mild abdominal distention. Skin-patient has a slightly firm nodule subcutaneously on the left mid abdomen which appears to have an opening indicative of a sebaceous cyst. Back-no CVA tenderness, no reproducible tenderness Extremities-no edema GU-normal appearance of the external genitalia, normal appearance of the vaginal canal.  Normal appearance of the cervix, no cervical motion tenderness, no adnexal masses or tenderness on exam.  Patient does have enlargement of the uterus      Assessment & Plan:  1. Essential hypertension Patient's blood pressure appears very well controlled on her current medication.  Patient's blood pressure was 122/85.  Patient will continue the use of amlodipine and hydrochlorothiazide.  DASH diet encouraged along with regular exercise.  2. Palpitations Patient with complaint of  palpitations.  Patient had EKG done at today's visit which did not show any significant abnormalities.  Patient will have CBC to look for anemia, BMP to look for electrolyte abnormality and TSH to look for thyroid disorder which may be contributing to her sensation of palpitations.  Patient was offered cardiology referral as patient would likely benefit from Holter monitoring to help determine the cause of her palpitations.  Patient wishes to wait for the results of her blood test and does not wish to have cardiology referral at this time - EKG 12-Lead - CBC with Differential - Basic Metabolic Panel - TSH  3. Iron deficiency anemia due to chronic blood loss Patient with a history of iron deficiency anemia due to chronic blood loss from menses.  Patient will have CBC at today's visit and follow-up.  Patient will be notified regarding the need for continuation of iron therapy based on her labs. - CBC with Differential  4. Fibroids Patient with uterine fibroids which are  likely contributing to patient's heavy menses.  Patient is to continue her follow-up with OB/GYN  5. Screening for cervical cancer Patient had Pap done at today's visit and patient will be notified of the results. - Cytology - PAP  6. Skin nodule Patient with subcutaneous skin nodule which likely represents sebaceous cyst.  Patient was encouraged to use warm compresses to the area but if area becomes more tender, larger in size, patient should return for evaluation as antibiotic therapy may be needed  7. Tobacco use Patient was counseled regarding the need for smoking cessation and patient was also told that if needed, she can return to clinic to discuss smoking cessation with the clinical pharmacist to see if other options are available as patient states that she did try the nicotine patches without success.  An After Visit Summary was printed and given to the patient.  Return in about 8 weeks (around 09/25/2018) for  palpitations-sooner if needed.

## 2018-08-01 LAB — CBC WITH DIFFERENTIAL/PLATELET
Basophils Absolute: 0 x10E3/uL (ref 0.0–0.2)
Basos: 0 %
EOS (ABSOLUTE): 0.1 x10E3/uL (ref 0.0–0.4)
Eos: 1 %
Hematocrit: 43.5 % (ref 34.0–46.6)
Hemoglobin: 14.5 g/dL (ref 11.1–15.9)
Immature Grans (Abs): 0 x10E3/uL (ref 0.0–0.1)
Immature Granulocytes: 0 %
Lymphocytes Absolute: 1.8 x10E3/uL (ref 0.7–3.1)
Lymphs: 31 %
MCH: 27.9 pg (ref 26.6–33.0)
MCHC: 33.3 g/dL (ref 31.5–35.7)
MCV: 84 fL (ref 79–97)
Monocytes Absolute: 0.6 x10E3/uL (ref 0.1–0.9)
Monocytes: 10 %
Neutrophils Absolute: 3.4 x10E3/uL (ref 1.4–7.0)
Neutrophils: 58 %
Platelets: 357 x10E3/uL (ref 150–450)
RBC: 5.19 x10E6/uL (ref 3.77–5.28)
RDW: 14.7 % (ref 12.3–15.4)
WBC: 5.8 x10E3/uL (ref 3.4–10.8)

## 2018-08-01 LAB — BASIC METABOLIC PANEL WITH GFR
BUN/Creatinine Ratio: 14 (ref 9–23)
BUN: 11 mg/dL (ref 6–24)
CO2: 27 mmol/L (ref 20–29)
Calcium: 10.6 mg/dL — ABNORMAL HIGH (ref 8.7–10.2)
Chloride: 101 mmol/L (ref 96–106)
Creatinine, Ser: 0.79 mg/dL (ref 0.57–1.00)
GFR calc Af Amer: 102 mL/min/1.73
GFR calc non Af Amer: 88 mL/min/1.73
Glucose: 87 mg/dL (ref 65–99)
Potassium: 4 mmol/L (ref 3.5–5.2)
Sodium: 142 mmol/L (ref 134–144)

## 2018-08-01 LAB — TSH: TSH: 0.744 u[IU]/mL (ref 0.450–4.500)

## 2018-08-06 LAB — CYTOLOGY - PAP
Diagnosis: NEGATIVE
HPV: NOT DETECTED

## 2018-08-11 ENCOUNTER — Telehealth: Payer: Self-pay | Admitting: *Deleted

## 2018-08-11 NOTE — Telephone Encounter (Signed)
Patient verified DOB Patient is aware of labs being normal including the PAP. No further questions.

## 2018-08-11 NOTE — Telephone Encounter (Signed)
-----   Message from Antony Blackbird, MD sent at 08/08/2018 11:03 PM EST ----- Please notify patient that her pap smear was normal. CBC, BMP and TSH were normal

## 2018-08-20 ENCOUNTER — Encounter: Payer: Self-pay | Admitting: Family Medicine

## 2018-08-25 MED FILL — HYDROCHLOROTHIAZIDE 25 MG T: 25 | 30 days supply | Qty: 30 | Fill #1

## 2018-08-25 MED FILL — AMLODIPINE BESYLATE 10 MG T: 10 | 30 days supply | Qty: 30 | Fill #1

## 2018-09-25 ENCOUNTER — Ambulatory Visit: Payer: Self-pay | Admitting: Family Medicine

## 2018-10-01 ENCOUNTER — Other Ambulatory Visit: Payer: Self-pay | Admitting: Family Medicine

## 2018-10-01 DIAGNOSIS — I10 Essential (primary) hypertension: Secondary | ICD-10-CM

## 2018-10-02 MED FILL — FERROUS SULFATE 325 MG TAB: 325 (65 FE) | 30 days supply | Qty: 60 | Fill #0

## 2018-10-02 MED FILL — AMLODIPINE BESYLATE 10 MG T: 10 | 30 days supply | Qty: 30 | Fill #2

## 2018-10-02 MED FILL — HYDROCHLOROTHIAZIDE 25 MG T: 25 | 30 days supply | Qty: 30 | Fill #2

## 2018-10-07 ENCOUNTER — Ambulatory Visit (HOSPITAL_COMMUNITY): Payer: Self-pay

## 2018-10-10 ENCOUNTER — Ambulatory Visit: Payer: Self-pay | Admitting: Family Medicine

## 2018-11-03 MED FILL — HYDROCHLOROTHIAZIDE 25 MG T: 25 | 30 days supply | Qty: 30 | Fill #3

## 2018-11-03 MED FILL — AMLODIPINE BESYLATE 10 MG T: 10 | 30 days supply | Qty: 30 | Fill #3

## 2018-11-03 MED FILL — FERROUS SULFATE 325 MG TAB: 325 (65 FE) | 30 days supply | Qty: 60 | Fill #1

## 2018-11-07 ENCOUNTER — Ambulatory Visit: Payer: Self-pay | Attending: Family Medicine

## 2018-11-07 ENCOUNTER — Other Ambulatory Visit: Payer: Self-pay

## 2018-11-26 ENCOUNTER — Ambulatory Visit: Payer: Self-pay

## 2018-11-26 ENCOUNTER — Other Ambulatory Visit: Payer: Self-pay

## 2018-11-26 ENCOUNTER — Ambulatory Visit: Payer: Self-pay | Admitting: Family Medicine

## 2018-11-27 ENCOUNTER — Ambulatory Visit: Payer: Self-pay | Admitting: Family Medicine

## 2018-11-27 MED FILL — FERROUS SULFATE 325 MG TAB: 325 (65 FE) | 30 days supply | Qty: 60 | Fill #2

## 2018-11-27 MED FILL — HYDROCHLOROTHIAZIDE 25 MG T: 25 | 30 days supply | Qty: 30 | Fill #4

## 2018-11-27 MED FILL — AMLODIPINE BESYLATE 10 MG T: 10 | 30 days supply | Qty: 30 | Fill #4

## 2018-12-03 ENCOUNTER — Other Ambulatory Visit: Payer: Self-pay | Admitting: Family Medicine

## 2018-12-03 DIAGNOSIS — Z1231 Encounter for screening mammogram for malignant neoplasm of breast: Secondary | ICD-10-CM

## 2018-12-05 ENCOUNTER — Other Ambulatory Visit: Payer: Self-pay

## 2018-12-05 ENCOUNTER — Encounter: Payer: Self-pay | Admitting: Primary Care

## 2018-12-05 ENCOUNTER — Ambulatory Visit: Payer: Self-pay | Attending: Family Medicine | Admitting: Primary Care

## 2018-12-05 DIAGNOSIS — K2100 Gastro-esophageal reflux disease with esophagitis, without bleeding: Secondary | ICD-10-CM | POA: Insufficient documentation

## 2018-12-05 DIAGNOSIS — Z720 Tobacco use: Secondary | ICD-10-CM

## 2018-12-05 DIAGNOSIS — N946 Dysmenorrhea, unspecified: Secondary | ICD-10-CM

## 2018-12-05 DIAGNOSIS — L570 Actinic keratosis: Secondary | ICD-10-CM

## 2018-12-05 DIAGNOSIS — J302 Other seasonal allergic rhinitis: Secondary | ICD-10-CM

## 2018-12-05 DIAGNOSIS — I10 Essential (primary) hypertension: Secondary | ICD-10-CM

## 2018-12-05 MED ORDER — OMEPRAZOLE 20 MG PO CPDR: 20.0000 mg | DELAYED_RELEASE_CAPSULE | Freq: Every day | ORAL | 3 refills | Status: DC

## 2018-12-05 MED ORDER — CETIRIZINE HCL 10 MG PO TABS: 10.0000 mg | ORAL_TABLET | Freq: Every day | ORAL | 11 refills | Status: DC

## 2018-12-05 MED FILL — OMEPRAZOLE 20 MG CAP: 20 | 30 days supply | Qty: 30 | Fill #0

## 2018-12-05 NOTE — Progress Notes (Signed)
Patient verified DOB Patient has taken medication today. Patient has not eaten today. Patient denies pain at this time. Patient request refills on Megace to [facility] [place] on the right side closer in to the abdomen. Knot is hard and tender to touch. Patient complains of it being bigger than previous visit. Patient is requesting dermatology referral for biopsied discolored skin.

## 2018-12-05 NOTE — Progress Notes (Signed)
Virtual Visit via Telephone Note  I connected with Becky Koch on 12/07/18 at 10:10 by telephone and verified that I am speaking with the correct person using two identifiers.   I discussed the limitations, risks, security and privacy concerns of performing an evaluation and management service by telephone and the availability of in person appointments. I also discussed with the patient that there may be a patient responsible charge related to this service. The patient expressed understanding and agreed to proceed.   History of Present Illness: Becky Koch is a f/u on hypertension. She has voiced other issues pain temporal and maxillary area, acid reflux  and bumps she noticed on her skin. Explain most elective office ie derm will not be available and till probably 1-2 months out.  Observations/Objective: Review of Systems  Constitutional: Positive for malaise/fatigue.  HENT: Positive for sinus pain.   Eyes: Negative.   Respiratory: Negative.   Cardiovascular: Negative.   Gastrointestinal: Positive for heartburn.  Genitourinary: Negative.   Musculoskeletal: Negative.   Skin:       1 on right upper arm  and  2 on  Upper right near shoulder back - both bump  Have discoloration approximately  size of a dime   Neurological: Negative.   Endo/Heme/Allergies: Positive for environmental allergies.  Psychiatric/Behavioral: Negative.     Assessment and Plan: Becky Koch was seen today for follow-up.  Diagnoses and all orders for this visit:  Dysmenorrhea History of menorrhagia and dysmenorrhea, on megace followed by OBGYN Dr. Hulan Fray  Essential hypertension Last  blood pressure 12/19 was 122/85  Continue  current medication. She is amlodipine and hydrochlorothiazide. Encouraged  Low sodium diet anda regular exercise.  Tobacco use Each visit discuss cessation and risk factors for MI or stroke  Actinic keratoses Discussed ABCD of skin cancer and monitor shape size and color of  bumps  Gastroesophageal reflux disease with esophagitis Likely GERD with burning up and down chest and stomach (no shortness of breath or chest pain) Advised after eating sit upright and prop self up at night . Send in PPI  Seasonal allergies Pollen is high with s/s likely sinus flare up send in antihistimeines   Follow Up Instructions:    I discussed the assessment and treatment plan with the patient. The patient was provided an opportunity to ask questions and all were answered. The patient agreed with the plan and demonstrated an understanding of the instructions.   The patient was advised to call back or seek an in-person evaluation if the symptoms worsen or if the condition fails to improve as anticipated.  I provided 20 minutes of non-face-to-face time during this encounter.   Kerin Perna, NP

## 2018-12-07 ENCOUNTER — Encounter: Payer: Self-pay | Admitting: Primary Care

## 2018-12-16 NOTE — Telephone Encounter (Signed)
Message sent to provider 

## 2018-12-24 ENCOUNTER — Other Ambulatory Visit: Payer: Self-pay | Admitting: Obstetrics & Gynecology

## 2018-12-30 MED FILL — HYDROCHLOROTHIAZIDE 25 MG T: 25 | 30 days supply | Qty: 30 | Fill #5

## 2018-12-30 MED FILL — AMLODIPINE BESYLATE 10 MG T: 10 | 30 days supply | Qty: 30 | Fill #5

## 2018-12-30 MED FILL — FERROUS SULFATE 325 MG TAB: 325 (65 FE) | 30 days supply | Qty: 60 | Fill #3

## 2019-01-02 MED FILL — MEGESTROL 40 MG TABLET: 40 | 30 days supply | Qty: 60 | Fill #0

## 2019-01-08 ENCOUNTER — Ambulatory Visit (HOSPITAL_COMMUNITY): Payer: Self-pay

## 2019-02-02 ENCOUNTER — Telehealth: Payer: Self-pay | Admitting: Lactation Services

## 2019-02-02 MED FILL — HYDROCHLOROTHIAZIDE 25 MG T: 25 | 30 days supply | Qty: 30 | Fill #6

## 2019-02-02 MED FILL — AMLODIPINE BESYLATE 10 MG T: 10 | 30 days supply | Qty: 30 | Fill #6

## 2019-02-02 MED FILL — OMEPRAZOLE 20 MG CAP: 20 | 30 days supply | Qty: 30 | Fill #1

## 2019-02-02 MED FILL — MEGESTROL 40 MG TABLET: 40 | 30 days supply | Qty: 60 | Fill #1

## 2019-02-02 NOTE — Telephone Encounter (Signed)
Pt called and left message on nurse voicemail that she sees Dr. Hulan Fray and would like an [place] scheduled due to fibroids. Routed to clinical pool for follow up.

## 2019-02-04 ENCOUNTER — Ambulatory Visit
Admission: RE | Admit: 2019-02-04 | Discharge: 2019-02-04 | Disposition: A | Discharge status: Home / Self Care | Payer: No Typology Code available for payment source | Source: Ambulatory Visit | Attending: Family Medicine | Admitting: Family Medicine

## 2019-02-04 ENCOUNTER — Other Ambulatory Visit: Payer: Self-pay

## 2019-02-04 DIAGNOSIS — Z1231 Encounter for screening mammogram for malignant neoplasm of breast: Secondary | ICD-10-CM

## 2019-02-04 NOTE — Telephone Encounter (Signed)
Called pt to discuss her request for [place] and current problems she is having. Pt had full VM box and message could not be left. Per chart review, pt was last seen in office by Dr. Hulan Fray on 06/07/18. She was supposed to have follow up however pt cancelled appt. Additional information will be needed from pt in order to consult with Dr. Hulan Fray for plan of care recommendation.

## 2019-02-05 ENCOUNTER — Telehealth: Payer: Self-pay | Admitting: Emergency Medicine

## 2019-02-05 NOTE — Telephone Encounter (Signed)
Patient contacted via phone to be given results of labs.  Patient identified by name and date of birth.  Patient given results of labs.  Patient educated on lab results. Questions answered. Patient acknowledged understanding of labs results. 

## 2019-02-16 ENCOUNTER — Telehealth (INDEPENDENT_AMBULATORY_CARE_PROVIDER_SITE_OTHER): Payer: Self-pay | Admitting: Obstetrics & Gynecology

## 2019-02-16 DIAGNOSIS — N946 Dysmenorrhea, unspecified: Secondary | ICD-10-CM

## 2019-02-16 NOTE — Telephone Encounter (Signed)
Patient is calling requesting to get a Ultra Sound for her Fibroids.

## 2019-02-18 ENCOUNTER — Other Ambulatory Visit: Payer: Self-pay

## 2019-02-18 ENCOUNTER — Ambulatory Visit: Payer: Self-pay | Attending: Family Medicine | Admitting: Physician Assistant

## 2019-02-18 ENCOUNTER — Telehealth: Payer: Self-pay | Admitting: Obstetrics & Gynecology

## 2019-02-18 VITALS — BP 138/82 | HR 114 | Temp 98.5°F | Resp 18 | Ht 65.5 in | Wt 165.0 lb

## 2019-02-18 DIAGNOSIS — L299 Pruritus, unspecified: Secondary | ICD-10-CM

## 2019-02-18 DIAGNOSIS — F419 Anxiety disorder, unspecified: Secondary | ICD-10-CM

## 2019-02-18 DIAGNOSIS — I10 Essential (primary) hypertension: Secondary | ICD-10-CM

## 2019-02-18 DIAGNOSIS — G4700 Insomnia, unspecified: Secondary | ICD-10-CM

## 2019-02-18 DIAGNOSIS — T148XXA Other injury of unspecified body region, initial encounter: Secondary | ICD-10-CM

## 2019-02-18 DIAGNOSIS — R1900 Intra-abdominal and pelvic swelling, mass and lump, unspecified site: Secondary | ICD-10-CM

## 2019-02-18 DIAGNOSIS — L570 Actinic keratosis: Secondary | ICD-10-CM

## 2019-02-18 MED ORDER — HYDROCHLOROTHIAZIDE 25 MG PO TABS: ORAL_TABLET | ORAL | 2 refills | Status: DC

## 2019-02-18 MED ORDER — AMLODIPINE BESYLATE 10 MG PO TABS: 10.0000 mg | ORAL_TABLET | Freq: Every day | ORAL | 2 refills | Status: DC

## 2019-02-18 MED ORDER — TRAZODONE HCL 50 MG PO TABS: 25.0000 mg | ORAL_TABLET | Freq: Every evening | ORAL | 3 refills | Status: DC | PRN

## 2019-02-18 MED FILL — traZODone HCL 50 MG TABS: 50 | 30 days supply | Qty: 30 | Fill #0

## 2019-02-18 NOTE — Progress Notes (Signed)
Patient ID: Becky Koch, female   DOB: 09/28/1970, 48 y.o.   MRN: [id number]   Adlene Adduci, is a 48 y.o. female  HYQ:[id number]  [id number]:[id number]  DOB - 05-09-1971  Subjective:  Chief Complaint and HPI: Becky Koch is a 48 y.o. female here today for several issues.  She has noticed after drinking alcohol 2 times over the last month that she has started itching in the middle of the night.  She has not noticed a rash.  She has also had 2 separate bruises she could not account for.  She says she doesn't drink alcohol often/doesn't feel it is problematic.  Difficulty going to sleep and staying asleep.  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    Also c/o small "lump" in LUQ that has been present for about 4 months.  Seems to be getting bigger and now tender at times.  She does wear a corset/shape wear frequently.  No change in appetite.  No N/V/D/  Bowels moving normally.    ROS:   Constitutional:  No f/c, No night sweats, No unexplained weight loss. EENT:  No vision changes, No blurry vision, No hearing changes. No mouth, throat, or ear problems.  Respiratory: No cough, No SOB Cardiac: No CP, no palpitations GI:  No abd pain, No N/V/D. GU: No Urinary s/sx Musculoskeletal: No joint pain Neuro: No headache, no dizziness, no motor weakness.  Skin: No rash Endocrine:  No polydipsia. No polyuria.  Psych: Denies SI/HI  No problems updated.  ALLERGIES: No Known Allergies  PAST MEDICAL HISTORY: Past Medical History:  Diagnosis Date   Fibroid, uterine    Hypertension     MEDICATIONS AT HOME: Prior to Admission medications   Medication Sig Start Date End Date Taking? Authorizing Provider  amLODipine (NORVASC) 10 MG tablet Take 1 tablet (10 mg total) by mouth daily. 02/18/19  Yes Freeman Caldron M, PA-C  cetirizine (ZYRTEC) 10 MG tablet Take 1 tablet (10 mg total) by mouth daily. 12/05/18  Yes Kerin Perna, NP  ferrous sulfate 325  (65 FE) MG tablet Take 1 tablet (325 mg total) by mouth 2 (two) times daily with a meal. 06/23/18  Yes Elsie Stain, MD  hydrochlorothiazide (HYDRODIURIL) 25 MG tablet TAKE 1 TABLET(25 MG) BY MOUTH DAILY 02/18/19  Yes Menucha Dicesare M, PA-C  ibuprofen (ADVIL,MOTRIN) 200 MG tablet Take 800 mg by mouth as needed for headache or moderate pain.   Yes [provider]  megestrol (MEGACE) 40 MG tablet TAKE 1 TABLET(40 MG) BY MOUTH TWICE DAILY 12/24/18  Yes Dove, Myra C, MD  naproxen (NAPROSYN) 500 MG tablet Take 1 tablet (500 mg total) by mouth 2 (two) times daily as needed. 06/23/18  Yes Elsie Stain, MD  nicotine (NICODERM CQ - DOSED IN MG/24 HOURS) 14 mg/24hr patch Place 1 patch (14 mg total) onto the skin daily. After 28 days of 14mg  patch, then start 7mg  daily 06/23/18  Yes Elsie Stain, MD  omeprazole (PRILOSEC) 20 MG capsule Take 1 capsule (20 mg total) by mouth daily. 12/05/18  Yes Kerin Perna, NP  traZODone (DESYREL) 50 MG tablet Take 0.5-1 tablets (25-50 mg total) by mouth at bedtime as needed for sleep. 02/18/19   Argentina Donovan, PA-C     Objective:  EXAM:   Vitals:   02/18/19 1527  BP: 138/82  Pulse: (!) 114  Resp: 18  Temp: 98.5 F (36.9 C)  TempSrc: Oral  SpO2: 100%  Weight: 165 lb (74.8 kg)  Height: 5' 5.5" (1.664 m)    General appearance : A&OX3. NAD. Non-toxic-appearing HEENT: Atraumatic and Normocephalic.  PERRLA. EOM intact.  Neck: supple, no JVD. No cervical lymphadenopathy. No thyromegaly Chest/Lungs:  Breathing-non-labored, Good air entry bilaterally, breath sounds normal without rales, rhonchi, or wheezing  CVS: S1 S2 regular, no murmurs, gallops, rubs.  Rate is at 92bpm on exam Abdomen: Bowel sounds present.  LUQ just below lower ribs in mid clavicular line there is a 2 cm well circumscribed area that is below the skin.  Freely mobile and does not seem to be attached to underlying structures.   Non tender and not distended with no gaurding,  rigidity or rebound. Extremities: Bilateral Lower Ext shows no edema, both legs are warm to touch with = pulse throughout Neurology:  CN II-XII grossly intact, Non focal.   Psych:  TP linear. J/I WNL. Normal speech. Appropriate eye contact and affect.  Skin:  No Rash.  R upper shoulder actinic keratosis present  Data Review Lab Results  Component Value Date   HGBA1C 5.1 06/21/2016     Assessment & Plan   1. Abdominal mass, unspecified abdominal location-?lipoma vs other - Comprehensive metabolic panel - US Abdomen Complete; Future  2. Actinic keratoses - Ambulatory referral to Dermatology  3. Insomnia, unspecified type - traZODone (DESYREL) 50 MG tablet; Take 0.5-1 tablets (25-50 mg total) by mouth at bedtime as needed for sleep.  Dispense: 30 tablet; Refill: 3 - Vitamin D, 25-hydroxy - TSH  4. Bruising - CBC with Differential/Platelet  5. Itching - Comprehensive metabolic panel  6. Anxiety - Vitamin D, 25-hydroxy - TSH -counseled on self-care, relaxation techniques.    7. Essential hypertension - amLODipine (NORVASC) 10 MG tablet; Take 1 tablet (10 mg total) by mouth daily.  Dispense: 30 tablet; Refill: 2 - hydrochlorothiazide (HYDRODIURIL) 25 MG tablet; TAKE 1 TABLET(25 MG) BY MOUTH DAILY  Dispense: 30 tablet; Refill: 2  Patient have been counseled extensively about nutrition and exercise  Return in about 1 month (around 03/21/2019) for PCP for f/up.  The patient was given clear instructions to go to ER or return to medical center if symptoms don't improve, worsen or new problems develop. The patient verbalized understanding. The patient was told to call to get lab results if they haven't heard anything in the next week.     Freeman Caldron, PA-C [facility] and [place], [place]   02/18/2019, 3:42 PM

## 2019-02-18 NOTE — Telephone Encounter (Signed)
Patient is requesting to get a Ultra Sound for her Fibroids

## 2019-02-19 ENCOUNTER — Telehealth: Payer: Self-pay | Admitting: Emergency Medicine

## 2019-02-19 LAB — COMPREHENSIVE METABOLIC PANEL
ALT: 32 IU/L (ref 0–32)
AST: 47 IU/L — ABNORMAL HIGH (ref 0–40)
Albumin/Globulin Ratio: 1.6 (ref 1.2–2.2)
Albumin: 4.8 g/dL (ref 3.8–4.8)
Alkaline Phosphatase: 79 IU/L (ref 39–117)
BUN/Creatinine Ratio: 13 (ref 9–23)
BUN: 10 mg/dL (ref 6–24)
Bilirubin Total: 0.3 mg/dL (ref 0.0–1.2)
CO2: 19 mmol/L — ABNORMAL LOW (ref 20–29)
Calcium: 9.9 mg/dL (ref 8.7–10.2)
Chloride: 103 mmol/L (ref 96–106)
Creatinine, Ser: 0.79 mg/dL (ref 0.57–1.00)
GFR calc Af Amer: 102 mL/min/{1.73_m2} (ref >59)
GFR calc non Af Amer: 88 mL/min/{1.73_m2} (ref >59)
Globulin, Total: 3 g/dL (ref 1.5–4.5)
Glucose: 147 mg/dL — ABNORMAL HIGH (ref 65–99)
Potassium: 3.5 mmol/L (ref 3.5–5.2)
Sodium: 140 mmol/L (ref 134–144)
Total Protein: 7.8 g/dL (ref 6.0–8.5)

## 2019-02-19 LAB — CBC WITH DIFFERENTIAL/PLATELET
Basophils Absolute: 0 10*3/uL (ref 0.0–0.2)
Basos: 0 %
EOS (ABSOLUTE): 0.1 10*3/uL (ref 0.0–0.4)
Eos: 1 %
Hematocrit: 44.9 % (ref 34.0–46.6)
Hemoglobin: 14.6 g/dL (ref 11.1–15.9)
Immature Grans (Abs): 0 10*3/uL (ref 0.0–0.1)
Immature Granulocytes: 0 %
Lymphocytes Absolute: 2.2 10*3/uL (ref 0.7–3.1)
Lymphs: 28 %
MCH: 29.7 pg (ref 26.6–33.0)
MCHC: 32.5 g/dL (ref 31.5–35.7)
MCV: 91 fL (ref 79–97)
Monocytes Absolute: 0.7 10*3/uL (ref 0.1–0.9)
Monocytes: 8 %
Neutrophils Absolute: 4.8 10*3/uL (ref 1.4–7.0)
Neutrophils: 63 %
Platelets: 363 10*3/uL (ref 150–450)
RBC: 4.92 x10E6/uL (ref 3.77–5.28)
RDW: 12.5 % (ref 11.7–15.4)
WBC: 7.8 10*3/uL (ref 3.4–10.8)

## 2019-02-19 LAB — VITAMIN D 25 HYDROXY (VIT D DEFICIENCY, FRACTURES): Vit D, 25-Hydroxy: 35.9 ng/mL (ref 30.0–100.0)

## 2019-02-19 LAB — TSH: TSH: 1.91 u[IU]/mL (ref 0.450–4.500)

## 2019-02-19 NOTE — Telephone Encounter (Signed)
Patient contacted via phone to be given results of labs.  Patient identified by name and date of birth.  Patient given results of labs.  Patient educated on lab results. Questions answered. Patient acknowledged understanding of labs results. 

## 2019-02-19 NOTE — Telephone Encounter (Signed)
Called and spoke with pt. She has been seen by [facility] and pt has an [place] scheduled for 7/10.   Pt reports she sees Dr. Hulan Fray and reports she was told she has Fibroids. She has not been seen in about a year. She would like to have her Fibroids evaluated at the same time. Will send message to Dr. Hulan Fray to see if she wants to add a Pelvic Ultrasound to her Abdominal US on 7/10.   Informed pt that I would ask Dr. Hulan Fray and see if she would like to add the Pelvic US also. Pt voiced understanding.

## 2019-02-23 ENCOUNTER — Other Ambulatory Visit: Payer: Self-pay | Admitting: Obstetrics & Gynecology

## 2019-02-23 DIAGNOSIS — D251 Intramural leiomyoma of uterus: Secondary | ICD-10-CM

## 2019-02-23 NOTE — Progress Notes (Signed)
Trans vaginal sonogram ordered per patient request to follow up on fibroids.

## 2019-02-24 ENCOUNTER — Ambulatory Visit (HOSPITAL_COMMUNITY): Payer: No Typology Code available for payment source

## 2019-02-26 ENCOUNTER — Ambulatory Visit: Payer: No Typology Code available for payment source

## 2019-02-27 ENCOUNTER — Encounter (HOSPITAL_COMMUNITY): Payer: Self-pay

## 2019-02-27 ENCOUNTER — Ambulatory Visit (HOSPITAL_COMMUNITY)
Admission: RE | Admit: 2019-02-27 | Discharge: 2019-02-27 | Disposition: A | Discharge status: Home / Self Care | Payer: Self-pay | Source: Ambulatory Visit | Attending: Physician Assistant | Admitting: Physician Assistant

## 2019-02-27 ENCOUNTER — Other Ambulatory Visit: Payer: Self-pay

## 2019-02-27 DIAGNOSIS — R1900 Intra-abdominal and pelvic swelling, mass and lump, unspecified site: Secondary | ICD-10-CM

## 2019-03-05 ENCOUNTER — Ambulatory Visit: Payer: No Typology Code available for payment source

## 2019-03-05 ENCOUNTER — Other Ambulatory Visit: Payer: Self-pay

## 2019-03-05 ENCOUNTER — Ambulatory Visit (INDEPENDENT_AMBULATORY_CARE_PROVIDER_SITE_OTHER): Payer: Self-pay | Admitting: Family Medicine

## 2019-03-05 VITALS — BP 124/62 | HR 111 | Wt 167.2 lb

## 2019-03-05 DIAGNOSIS — L570 Actinic keratosis: Secondary | ICD-10-CM

## 2019-03-05 NOTE — Progress Notes (Signed)
HPI  Benson a 48 y.o. female who presents for evaluation and treatment of actinic keratosis. Patient was seen in 2018 for a lesion on her right shoulder. It was shave biopsied and came back "precancerous". She now feels 2-3 more lesions on her back and would like to get them all treated.  Physical Exam  Constitutional: She is well-developed, well-nourished, and in no distress.  HENT:  Head: Normocephalic and atraumatic.  Skin: Skin is warm, dry and intact.               Cryotherapy Procedure Note  Pre-operative Diagnosis: Actinic keratosis  Post-operative Diagnosis: Actinic keratosis  Locations: upper arm , 2 locations in the middle of her back   Indications: Actinic Keratosis   Anesthesia: not required .  Procedure Details  History of allergy to iodine: no. Pacemaker? no.  Patient informed of risks (permanent scarring, infection, light or dark discoloration, bleeding, infection, weakness, numbness and recurrence of the lesion) and benefits of the procedure and verbal and written informed consent obtained.  The areas are treated with liquid nitrogen therapy, frozen until ice ball extended 1 mm beyond lesion, allowed to thaw, and treated again. The patient tolerated procedure well.  The patient was instructed on post-op care, warned that there may be blister formation, redness and pain. Recommend OTC analgesia as needed for pain.  Condition: Stable  Complications: none.  Plan: 1. Instructed to keep the area dry and covered for 24-48h and clean thereafter. 2. Warning signs of infection were reviewed.   3. Recommended that the patient use OTC acetaminophen as needed for pain.  4. Return in 2-4 weeks

## 2019-03-05 NOTE — Assessment & Plan Note (Signed)
Right shoulder and two locations on back  - Shave biopsy in 2018  - Cryotherapy 03/05/2019 - Return to derm clinic in 1 month

## 2019-03-05 NOTE — Progress Notes (Signed)
Patient ID: Becky Koch, female   DOB: 27-Jun-1971, 48 y.o.   MRN: [id number]  48 Y/O F presented with lesions on her right shoulder x 2 years and two smaller lesion of her back that started after the left shoulder lesion.  She had a procedure done on the shoulder lesion and left her with a scar. Since then, the lesion had not change in size or color.  Exam:  Hyperpigmented oval lesion with whitish grey scale like keratosis measuring about 38mm by 13mm in size.  Small lesions on the right side of her mid back laterally. Similar lesion on her mid back on the left side.  Some excoriation marks on the small lesions on her back.   See picture        A/P:  Lesion atypical for AK. Likely some scarring on her right shoulder lesion f/u excision. I am less concern about cancerous transformation since it has not changed in color or size. She is advised to f/u in the next 2-4 weeks for reassessment. If there is no improvement following cryotherapy done today, we will proceed with excision of the right shoulder lesion.  We performed cryotherapy of the two lesions on her back as well. We will follow closely.  Of note, her HR was a bit elevated. Likely due to anxiety related to the procedure. The plan was to recheck before she left but that did not happen. I called back to inform her about this. She denied SOB or chest pain. I encouraged her to see her PCP soon for reassessment and she agreed with the plan.

## 2019-03-05 NOTE — Patient Instructions (Addendum)
I was a pleasure to meet you today! We evaluated the lesions on your right arm and back. We preformed cryotherapy and would like to see you back in 2-4 weeks.   Have a wonderful afternoon!  Cryoablation Cryoablation is a procedure used to remove abnormal growths or cancerous tissue. This is done by freezing the growth or tissue with either liquid nitrogen or argon gas. This procedure may be done to treat many conditions, including:  Skin tumors.  Non-cancerous (benign) nodules.  A type of eye cancer (retinoblastoma).  Cancers of the prostate, liver, kidney, cervix, lung, and bone. Tell a health care provider about:  Any allergies you have.  All medicines you are taking, including vitamins, herbs, eye drops, creams, and over-the-counter medicines.  Any problems you or family members have had with anesthetic medicines.  Any blood disorders you have.  Any surgeries you have had.  Any medical conditions you have.  Whether you are pregnant or may be pregnant. What are the risks? Generally, this is a safe procedure. However, problems may occur, including:  Infection.  Bleeding.  Swelling.  Nerve damage and loss of feeling. This is rare.  Allergic reactions to medicines.  Damage to other structures or organs. What happens before the procedure? Staying hydrated Follow instructions from your health care provider about hydration, which may include:  Up to 2 hours before the procedure - you may continue to drink clear liquids, such as water, clear fruit juice, black coffee, and plain tea. Eating and drinking restrictions Follow instructions from your health care provider about eating and drinking, which may include:  8 hours before the procedure - stop eating heavy meals or foods such as meat, fried foods, or fatty foods.  6 hours before the procedure - stop eating light meals or foods, such as toast or cereal.  6 hours before the procedure - stop drinking milk or drinks  that contain milk.  2 hours before the procedure - stop drinking clear liquids. Medicines  Ask your health care provider about: ? Changing or stopping your regular medicines. This is especially important if you are taking diabetes medicines or blood thinners. ? Taking medicines such as aspirin and ibuprofen. These medicines can thin your blood. Do not take these medicines before your procedure if your health care provider instructs you not to.  You may be given antibiotic medicine to help prevent infection. General instructions  You may have blood tests.  Plan to have someone take you home from the hospital or clinic.  If you will be going home right after the procedure, plan to have someone with you for 24 hours.  Ask your health care provider how your surgical site will be marked or identified. What happens during the procedure?  To reduce your risk of infection: ? Your health care team will wash or sanitize their hands. ? Your skin will be washed with soap. ? Hair may be removed from the surgical area.  An IV tube will be inserted into one of your veins.  You will be given one or more of the following: ? A medicine to help you relax (sedative). ? A medicine to numb the area (local anesthetic). ? A medicine to make you fall asleep (general anesthetic). ? A medicine that is injected into your spine to numb the area below and slightly above the injection site (spinal anesthetic). ? A medicine that is injected into an area of your body to numb everything below the injection site (regional anesthetic).  Depending on the location of the growth, a small incision may be made. A bronchoscope may be used for a lung tumor, or a laparoscope may be used for an abdominal tumor.  Your health care provider may use a device (cryoprobe) that has liquid nitrogen or argon gas flowing through it. The cryoprobe will be inserted through the incision to the area where the tumor is located. This may  be done with guidance from imaging such as an ultrasound, CT scan, or MRI scan.  Liquid nitrogen or argon gas will be delivered through the cryoprobe to the growth until it is frozen and destroyed.  The process may be repeated on other areas depending on how many areas need treatment.  The cryoprobe will be removed and pressure will be applied to stop any bleeding.  The incision will be closed with stitches (sutures).  The incision will be covered with a bandage (dressing). The procedure will vary depending on the location of the tumor or nodule. The procedure may also vary among health care providers and hospitals. What happens after the procedure?  Do not drive for 24 hours if you received a sedative.  Your blood pressure, heart rate, breathing rate, and blood oxygen level will be monitored until the medicines you were given have worn off.  You will be given medicine to help with pain, nausea, and vomiting as needed. This information is not intended to replace advice given to you by your health care provider. Make sure you discuss any questions you have with your health care provider. Document Released: 05/27/2013 Document Revised: 07/19/2017 Document Reviewed: 01/04/2016 Elsevier Patient Education  2020 [employer].

## 2019-03-06 MED FILL — ?HYDROCHLOROTHIAZIDE 25MG T: 25 | 30 days supply | Qty: 30 | Fill #0

## 2019-03-06 MED FILL — MEGESTROL 40 MG TABLET: 40 | 30 days supply | Qty: 60 | Fill #2

## 2019-03-06 MED FILL — ?AMLODIPINE BESYLATE 10 MG: 10 | 30 days supply | Qty: 30 | Fill #0

## 2019-03-23 ENCOUNTER — Encounter: Payer: Self-pay | Admitting: Obstetrics & Gynecology

## 2019-03-23 ENCOUNTER — Other Ambulatory Visit: Payer: Self-pay

## 2019-03-23 ENCOUNTER — Telehealth: Payer: Self-pay | Admitting: Family Medicine

## 2019-03-23 ENCOUNTER — Ambulatory Visit (HOSPITAL_COMMUNITY)
Admission: RE | Admit: 2019-03-23 | Discharge: 2019-03-23 | Disposition: A | Discharge status: Home / Self Care | Payer: Self-pay | Source: Ambulatory Visit | Attending: Obstetrics & Gynecology | Admitting: Obstetrics & Gynecology

## 2019-03-23 ENCOUNTER — Ambulatory Visit: Payer: No Typology Code available for payment source | Admitting: Obstetrics & Gynecology

## 2019-03-23 DIAGNOSIS — D251 Intramural leiomyoma of uterus: Secondary | ICD-10-CM | POA: Insufficient documentation

## 2019-03-23 NOTE — Telephone Encounter (Signed)
Called patient due to previous phone call. Patient voicemail box is full.  If patient call office back, below is response to patient message:  Ultrasound of the Abdomin was sch for Aug 6th arriving at 10:15 am for a 10:30 appointment. Nothing to eat or drink after midnight. Ultrasound will be at [facility] and go through the main entrance and the front staff will direct you. If you are not able to make it for the Ultrasound of the Abdomen that day, please call [phone] to resch your appt.

## 2019-03-23 NOTE — Telephone Encounter (Signed)
Patient states she was scheduled to have an ultrasound however they needed to get further to clarification on what ultrasounds she needs to get done and would like for it to be scheduled. Please follow up.

## 2019-03-23 NOTE — Progress Notes (Signed)
She is here to discuss her u/s results but it has not been done yet. She will reschedule her visit.

## 2019-03-25 ENCOUNTER — Ambulatory Visit: Payer: No Typology Code available for payment source | Admitting: Family Medicine

## 2019-03-26 ENCOUNTER — Ambulatory Visit (HOSPITAL_COMMUNITY): Admission: RE | Admit: 2019-03-26 | Payer: Self-pay | Source: Ambulatory Visit

## 2019-03-26 NOTE — Telephone Encounter (Signed)
Spoke with patient to see if she ever called office to get information from previous message. Per pt she tried calling office several times but could not get through. Staff provided patient with scheduling number so she could call and resch her Ultrasound of the Abd. Patient verbalized understanding and agreed to call and resch appt.

## 2019-04-02 ENCOUNTER — Ambulatory Visit (INDEPENDENT_AMBULATORY_CARE_PROVIDER_SITE_OTHER): Payer: Self-pay | Admitting: Family Medicine

## 2019-04-02 ENCOUNTER — Other Ambulatory Visit: Payer: Self-pay

## 2019-04-02 DIAGNOSIS — L570 Actinic keratosis: Secondary | ICD-10-CM

## 2019-04-02 MED ORDER — HYDROCORTISONE 2.5 % EX OINT: TOPICAL_OINTMENT | Freq: Two times a day (BID) | CUTANEOUS | 1 refills | Status: DC

## 2019-04-02 NOTE — Patient Instructions (Signed)
Good to see you today!  Thanks for coming in.  For both skin lesions use the hydrocortisone ointment twice a day every day for the next 6 weeks then come back  We will send for the biopsy result from Dr Allyson Sabal.  If the lesions do not heal completely we will likely need to biopsy them again

## 2019-04-02 NOTE — Progress Notes (Signed)
Date of Visit: 04/02/2019   HPI:  Becky Koch is a 48 year old female presenting for a follow up for skin lesions that was treated with cryotherapy on 7/16. She has had the lesions for at least 3 years. In 2018, the lesion on the arm was biopsied by a dermatologist. Biopsy showed precancerous tissue.  Today she has a lesion on her upper right arm, and upper right back. The lower left back lesion has been resolved. The lesion on her arm is itchy but has not been draining, bleeding or changing in size or shape. The lesion on her upper right back is not as itchy but feels tight and there is a pulling sensation. No bleeding or draining in that lesion.   No new lesion else where on the body.   ROS: See HPI.  PHYSICAL EXAM: BP 122/64   Pulse (!) 103   Ht 5\' 6"  (1.676 m)   Wt 163 lb 6 oz (74.1 kg)   SpO2 98%   BMI 26.37 kg/m  Gen: Well appering female in no acute distress.  Right arm: Hyperpigmented oval lesion with whitish central scaling on right deltoid. About 2 mm in size.  Back: Circular lesion with surrounding hyperpigmentation and central depression on the right upper back. White/gray scaling centrally. About 1 mm in size.   ASSESSMENT/PLAN:  Actinic keratoses Right upper arm and medial right upper back lesion with epidermal disruption. Lessions with surrounding hyperpigmentation and central white crusting. Lesions are atypical for actinic keratosis. Requested release for biopsy information from Dr. Ledell Peoples office. In the meanwhile, patient should use hydrocortisone cream for 6 weeks to heal the lesions. Follow up in 6 weeks if lesions have not healed. Lesions can be biopsied again for precancerous skin lesion.   FOLLOW UP: Follow up in 6 weeks.   Vernice Jefferson MS3, Medical Student

## 2019-04-02 NOTE — Assessment & Plan Note (Addendum)
Right upper arm and medial right upper back lesion with epidermal disruption. Lessions with surrounding hyperpigmentation and central white crusting. Lesions are atypical for actinic keratosis. (Lesions are larger, more formed with surrounding scar like tissue and central disruption)  Requested release for biopsy information from Dr. Ledell Peoples office. In the meanwhile, patient should use hydrocortisone cream for 6 weeks to heal the lesions. Follow up in 6 weeks if lesions have not healed. Lesions can be biopsied again for precancerous skin lesion.

## 2019-04-03 ENCOUNTER — Encounter: Payer: Self-pay | Admitting: Obstetrics & Gynecology

## 2019-04-03 ENCOUNTER — Ambulatory Visit (INDEPENDENT_AMBULATORY_CARE_PROVIDER_SITE_OTHER): Payer: Self-pay | Admitting: Obstetrics & Gynecology

## 2019-04-03 VITALS — BP 138/94 | HR 93 | Wt 160.0 lb

## 2019-04-03 DIAGNOSIS — D251 Intramural leiomyoma of uterus: Secondary | ICD-10-CM

## 2019-04-03 DIAGNOSIS — N946 Dysmenorrhea, unspecified: Secondary | ICD-10-CM

## 2019-04-03 MED ORDER — MISOPROSTOL 200 MCG PO TABS: ORAL_TABLET | ORAL | 0 refills | Status: DC

## 2019-04-03 NOTE — Progress Notes (Addendum)
   Subjective:    Patient ID: Becky Koch, female    DOB: Sep 24, 1970, 48 y.o.   MRN: [id number]  HPI 48 yo married P4 (8 grands) here to discuss her desire for a hysterectomy "I want everything out". She has years of pelvic pain, heavy periods, and pain with sex. She is currently taking megace daily and iron BID. The megace has slowed down her periods but she still has spotting. Ultrasound showed uterine enlargement with fibroids.  Review of Systems Pap and mammogram normal recently. All 4 deliveries were vaginal. She has had a BTL.   She does have hot flashes. She reports normal bowel and bladder function. She works as a Geophysical data processor", denies any heavy lifting. Objective:   Physical Exam Breathing, conversing, and ambulating normally Well nourished, well hydrated Black female, no apparent distress Abd- benign Bimanual exam reveals a globular fibroid smooth uterus, mobile Vaginal arch should be ok for [facility]     Assessment & Plan:  Dysmenorrhea, dysparueunia and heavy periods- stable on megace She declines medical forms of treatment and wishes to proceed with TVH/BSO. She wants ovaries removed, understands that she will be post menopausal with symptoms possible. She will need a EMBX prior to surgery. I will pretreat with cytotec.

## 2019-04-03 NOTE — Addendum Note (Signed)
Addended by: Clovia Cuff C on: 04/03/2019 10:20 AM   Modules accepted: Orders

## 2019-04-06 MED FILL — traZODone HCL 50 MG TABS: 50 | 30 days supply | Qty: 30 | Fill #1

## 2019-04-06 MED FILL — MEGESTROL 40 MG TABLET: 40 | 30 days supply | Qty: 60 | Fill #3

## 2019-04-06 MED FILL — OMEPRAZOLE 20 MG CAP: 20 | 30 days supply | Qty: 30 | Fill #2

## 2019-04-06 MED FILL — ?HYDROCHLOROTHIAZIDE 25MG T: 25 | 30 days supply | Qty: 30 | Fill #1

## 2019-04-06 MED FILL — ?AMLODIPINE BESYLATE 10 MG: 10 | 30 days supply | Qty: 30 | Fill #1

## 2019-04-08 ENCOUNTER — Telehealth: Payer: Self-pay | Admitting: *Deleted

## 2019-04-08 NOTE — Telephone Encounter (Signed)
-----   Message from Emily Filbert, MD sent at 04/08/2019  8:01 AM EDT ----- She needs an [facility] within the next week.

## 2019-04-08 NOTE — Telephone Encounter (Signed)
I called Becky Koch per Dr.Dove message and informed her Dr.Dove wants her to have an endometrial biopsy within the next week if possible. I gave her the appointment that was scheduled for 8/31. She states she had already been notified and has no questions.  Linda,RN

## 2019-04-10 ENCOUNTER — Other Ambulatory Visit: Payer: Self-pay

## 2019-04-10 ENCOUNTER — Ambulatory Visit (HOSPITAL_COMMUNITY)
Admission: RE | Admit: 2019-04-10 | Discharge: 2019-04-10 | Disposition: A | Discharge status: Home / Self Care | Payer: Self-pay | Source: Ambulatory Visit | Attending: Physician Assistant | Admitting: Physician Assistant

## 2019-04-10 DIAGNOSIS — R1900 Intra-abdominal and pelvic swelling, mass and lump, unspecified site: Secondary | ICD-10-CM | POA: Insufficient documentation

## 2019-04-13 ENCOUNTER — Telehealth (INDEPENDENT_AMBULATORY_CARE_PROVIDER_SITE_OTHER): Payer: Self-pay | Admitting: Lactation Services

## 2019-04-13 DIAGNOSIS — N946 Dysmenorrhea, unspecified: Secondary | ICD-10-CM

## 2019-04-13 NOTE — Telephone Encounter (Signed)
Called pt and she needs to sign the Hysterectomy statement prior to surgery. Pt is on the way to the office to bring other paperwork and will sign at that time.

## 2019-04-13 NOTE — Telephone Encounter (Signed)
-----   Message from Francia Greaves sent at 04/13/2019  1:47 PM EDT ----- Regarding: Need Hysterectomy Statment Needs Hysterectomy Statement, surgery 09/08

## 2019-04-15 ENCOUNTER — Other Ambulatory Visit: Payer: Self-pay | Admitting: Physician Assistant

## 2019-04-15 ENCOUNTER — Encounter: Payer: Self-pay | Admitting: Family Medicine

## 2019-04-15 ENCOUNTER — Ambulatory Visit: Payer: Self-pay | Attending: Family Medicine | Admitting: Family Medicine

## 2019-04-15 ENCOUNTER — Other Ambulatory Visit: Payer: Self-pay

## 2019-04-15 VITALS — BP 131/100 | HR 104 | Temp 99.0°F | Ht 66.0 in | Wt 162.0 lb

## 2019-04-15 DIAGNOSIS — R002 Palpitations: Secondary | ICD-10-CM

## 2019-04-15 DIAGNOSIS — R1012 Left upper quadrant pain: Secondary | ICD-10-CM

## 2019-04-15 DIAGNOSIS — I10 Essential (primary) hypertension: Secondary | ICD-10-CM

## 2019-04-15 DIAGNOSIS — Z1211 Encounter for screening for malignant neoplasm of colon: Secondary | ICD-10-CM

## 2019-04-15 DIAGNOSIS — Z809 Family history of malignant neoplasm, unspecified: Secondary | ICD-10-CM | POA: Insufficient documentation

## 2019-04-15 DIAGNOSIS — R000 Tachycardia, unspecified: Secondary | ICD-10-CM

## 2019-04-15 DIAGNOSIS — N946 Dysmenorrhea, unspecified: Secondary | ICD-10-CM

## 2019-04-15 DIAGNOSIS — R222 Localized swelling, mass and lump, trunk: Secondary | ICD-10-CM

## 2019-04-15 DIAGNOSIS — Z79899 Other long term (current) drug therapy: Secondary | ICD-10-CM | POA: Insufficient documentation

## 2019-04-15 DIAGNOSIS — D219 Benign neoplasm of connective and other soft tissue, unspecified: Secondary | ICD-10-CM

## 2019-04-15 DIAGNOSIS — F17210 Nicotine dependence, cigarettes, uncomplicated: Secondary | ICD-10-CM | POA: Insufficient documentation

## 2019-04-15 MED ORDER — METOPROLOL SUCCINATE 25 MG PO CS24: 25.0000 mg | EXTENDED_RELEASE_CAPSULE | Freq: Every day | ORAL | 11 refills | Status: DC

## 2019-04-15 MED FILL — ?METOPROLOL SUCC ER 25MG TA: 25 | 30 days supply | Qty: 30 | Fill #0

## 2019-04-15 NOTE — Progress Notes (Signed)
Established Patient Office Visit  Subjective:  Patient ID: Becky Koch, female    DOB: [birth year]  Age: 48 y.o. MRN: [id number]  CC:  Chief Complaint  Patient presents with  . Results    HPI Tyson JOSE KAUBLE presents for follow-up of office visit on 02/18/2019 with another provider at which time she complained of sensation of a "lump" in her left lower quadrant for which she was sent for an abdominal ultrasound as well as for insomnia for which she was placed on trazodone.  Patient also was given refills of her Norvasc and hydrochlorothiazide for continued treatment of hypertension.  Patient also complained of some unexplained bruising as well as itching for which she has CBC and comprehensive metabolic panel at her last visit.  Patient did have ultrasound of abdomen as well as pelvic/transvaginal ultrasound and states that she was notified of the results including uterine fibroids and she has had follow-up with GYN.  Patient also had abnormal findings on her abdominal ultrasound and patient returns for further follow-up.        Patient reports that GYN has suggested that she have a hysterectomy due to her uterine fibroids.  Patient has been taking Megace to help with her heavy menses.  Patient does have some fatigue as well as occasional sensation of palpitations.  She denies chest pain or chest pressure.  She states that she was told that she was not anemic based on blood work from her last visit.  Patient is not sure if she is having actual skipped beats or missed beats or if it is more so with sensation of increased heart rate which causes her to feel anxious at times.  She continues to have sensation of a lump or mass in her left lower abdomen but states that she wonders if this could be due to her fibroids.  Area is sometimes uncomfortable/aching but this is more of an inside discomfort.  She has not really able to reproduce the discomfort with touching the area at this time but this has occurred  in the past.  Discomfort is about a 4-6 on a 0-to-10 scale.  Past Medical History:  Diagnosis Date  . Fibroid, uterine   . Hypertension     Past Surgical History:  Procedure Laterality Date  . TUBAL LIGATION      Family History  Problem Relation Age of Onset  . Cancer Paternal Grandfather     Social History   Socioeconomic History  . Marital status: Legally Separated    Spouse name: Not on file  . Number of children: Not on file  . Years of education: Not on file  . Highest education level: Not on file  Occupational History  . Not on file  Social Needs  . Financial resource strain: Not on file  . Food insecurity    Worry: Not on file    Inability: Not on file  . Transportation needs    Medical: Not on file    Non-medical: Not on file  Tobacco Use  . Smoking status: Current Every Day Smoker    Packs/day: 0.50    Types: Cigarettes  . Smokeless tobacco: Never Used  Substance and Sexual Activity  . Alcohol use: Yes    Comment: social alcohol  . Drug use: No  . Sexual activity: Yes    Birth control/protection: Surgical  Lifestyle  . Physical activity    Days per week: Not on file    Minutes per session: Not on  file  . Stress: Not on file  Relationships  . Social Herbalist on phone: Not on file    Gets together: Not on file    Attends religious service: Not on file    Active member of club or organization: Not on file    Attends meetings of clubs or organizations: Not on file    Relationship status: Not on file  . Intimate partner violence    Fear of current or ex partner: Not on file    Emotionally abused: Not on file    Physically abused: Not on file    Forced sexual activity: Not on file  Other Topics Concern  . Not on file  Social History Narrative  . Not on file    Outpatient Medications Prior to Visit  Medication Sig Dispense Refill  . amLODipine (NORVASC) 10 MG tablet Take 1 tablet (10 mg total) by mouth daily. 30 tablet 2  .  cetirizine (ZYRTEC) 10 MG tablet Take 1 tablet (10 mg total) by mouth daily. 30 tablet 11  . hydrochlorothiazide (HYDRODIURIL) 25 MG tablet TAKE 1 TABLET(25 MG) BY MOUTH DAILY 30 tablet 2  . hydrocortisone 2.5 % ointment Apply topically 2 (two) times daily. 30 g 1  . ibuprofen (ADVIL,MOTRIN) 200 MG tablet Take 800 mg by mouth as needed for headache or moderate pain.    . megestrol (MEGACE) 40 MG tablet TAKE 1 TABLET(40 MG) BY MOUTH TWICE DAILY 60 tablet 5  . misoprostol (CYTOTEC) 200 MCG tablet Take 3 pills by mouth the night before biopsy. 3 tablet 0  . naproxen (NAPROSYN) 500 MG tablet Take 1 tablet (500 mg total) by mouth 2 (two) times daily as needed. 60 tablet 6  . nicotine (NICODERM CQ - DOSED IN MG/24 HOURS) 14 mg/24hr patch Place 1 patch (14 mg total) onto the skin daily. After 28 days of 14mg  patch, then start 7mg  daily 28 patch 0  . omeprazole (PRILOSEC) 20 MG capsule Take 1 capsule (20 mg total) by mouth daily. 30 capsule 3  . traZODone (DESYREL) 50 MG tablet Take 0.5-1 tablets (25-50 mg total) by mouth at bedtime as needed for sleep. 30 tablet 3  . ferrous sulfate 325 (65 FE) MG tablet Take 1 tablet (325 mg total) by mouth 2 (two) times daily with a meal. (Patient not taking: Reported on 04/15/2019) 60 tablet 6   No facility-administered medications prior to visit.     No Known Allergies  ROS Review of Systems  Constitutional: Positive for fatigue. Negative for chills and fever.  HENT: Negative for nosebleeds, sore throat and trouble swallowing.   Eyes: Negative for photophobia and visual disturbance.  Respiratory: Negative for cough and shortness of breath.   Cardiovascular: Positive for palpitations. Negative for chest pain and leg swelling.  Gastrointestinal: Negative for abdominal pain, constipation, diarrhea and nausea.  Endocrine: Negative for cold intolerance, heat intolerance, polydipsia, polyphagia and polyuria.  Genitourinary: Positive for menstrual problem (on  medication to help with prolonged bleeding but having menses today). Negative for dysuria, flank pain and frequency.  Musculoskeletal: Positive for back pain. Negative for arthralgias and gait problem.  Neurological: Negative for dizziness and headaches.  Hematological: Negative for adenopathy. Does not bruise/bleed easily.      Objective:    Physical Exam  Constitutional: She is oriented to person, place, and time. She appears well-developed and well-nourished.  Neck: No JVD present. No thyromegaly present.  Cardiovascular: Regular rhythm.  Mild tachycardia  Pulmonary/Chest: Effort normal  and breath sounds normal.  Abdominal: Soft. She exhibits distension (mild). There is no abdominal tenderness. There is no rebound and no guarding.  Musculoskeletal: Normal range of motion.        General: No tenderness or edema.     Comments: No CVA tenderness  Lymphadenopathy:    She has cervical adenopathy.  Neurological: She is alert and oriented to person, place, and time.  Skin: Skin is warm and dry.  Psychiatric: She has a normal mood and affect. Her behavior is normal.  Nursing note and vitals reviewed.   BP (!) 131/100 (BP Location: Left Arm, Patient Position: Sitting, Cuff Size: Large)   Pulse (!) 104   Temp 99 F (37.2 C) (Oral)   Ht 5\' 6"  (1.676 m)   Wt 162 lb (73.5 kg)   SpO2 96%   BMI 26.15 kg/m  Wt Readings from Last 3 Encounters:  04/15/19 162 lb (73.5 kg)  04/03/19 160 lb (72.6 kg)  04/02/19 163 lb 6 oz (74.1 kg)     Health Maintenance Due  Topic Date Due  . HIV Screening  03/21/1984  . COLONOSCOPY  03/22/2019      Lab Results  Component Value Date   TSH 1.910 02/18/2019   Lab Results  Component Value Date   WBC 7.8 02/18/2019   HGB 14.6 02/18/2019   HCT 44.9 02/18/2019   MCV 91 02/18/2019   PLT 363 02/18/2019   Lab Results  Component Value Date   NA 140 02/18/2019   K 3.5 02/18/2019   CO2 19 (L) 02/18/2019   GLUCOSE 147 (H) 02/18/2019   BUN 10  02/18/2019   CREATININE 0.79 02/18/2019   BILITOT 0.3 02/18/2019   ALKPHOS 79 02/18/2019   AST 47 (H) 02/18/2019   ALT 32 02/18/2019   PROT 7.8 02/18/2019   ALBUMIN 4.8 02/18/2019   CALCIUM 9.9 02/18/2019   ANIONGAP 8 05/14/2018   Lab Results  Component Value Date   CHOL 152 06/21/2016   Lab Results  Component Value Date   HDL 35 (L) 06/21/2016   Lab Results  Component Value Date   LDLCALC 95 06/21/2016   Lab Results  Component Value Date   TRIG 111 06/21/2016   Lab Results  Component Value Date   CHOLHDL 4.3 06/21/2016   Lab Results  Component Value Date   HGBA1C 5.1 06/21/2016      Assessment & Plan:  1. Essential hypertension; 2. Tachycardia; 3. Palpitations Patient's blood pressure is elevated at today's visit despite her use of amlodipine and hydrochlorothiazide.  Patient also with tachycardia which was also present at her visit in July.  Patient also with complaint of palpitations.  Patient will have EKG at today's visit.  Patient has been placed on metoprolol which hopefully should help with her sensation of increased heart rate as well as sensation of palpitations.  She will continue amlodipine and hydrochlorothiazide.  Patient's palpitations may actually be more so due to her increased heart rate.  Labs from July reviewed and patient had normal TSH therefore thyroid disorder is likely not causing her increased heart rate.  She was also not anemic.  Patient also with normal electrolytes but had elevated glucose but states that she ate just prior to her appointment.  EKG showed normal sinus rhythm with rate of 87.  Patient will be placed on metoprolol 25 mg extended release daily.  She may need follow-up with cardiology if she continues to have sensation of palpitations and increased heart rate.  Suggested that patient schedule nurse visit for blood pressure recheck and/or have blood pressure checked at preoperative visit with GYN. - EKG 12-Lead - Metoprolol Succinate  25 MG CS24; Take 25 mg by mouth daily.  Dispense: 30 capsule; Refill: 11  4. Subcutaneous mass of abdominal wall Patient reports sensation of subcutaneous mass of the abdominal wall and patient had abdominal ultrasound and transvaginal ultrasound done.  Per abdominal ultrasound report, " 3 mm area of decreased attenuation with shadowing in the subcutaneous region at the site of clinical concern in the left upper quadrant.  A well-defined focus of calcification is not seen although the shadowing suggests that this lesion probably is calcified.  Given this circumstance, CT through this area may be advisable for more precise assessment".  Discussed with patient that the subcutaneous mass that she is experiencing may be related to her uterine fibroids.  Discussed with patient that I will ask radiology to review the abdominal ultrasound in light of findings from transvaginal ultrasound to see if a calcified uterine fibroid could be responsible for the 3 mm area of decreased attenuation.  Patient did not have any evidence of adnexal masses on transvaginal ultrasound.  If radiology cannot determine if uterine fibroids may be responsible for the 3 mm area of decreased attenuation then patient may need to have CT of the abdomen/pelvis scheduled.  Patient will be notified.  5. Dysmenorrhea; 6. Fibroids Patient is being followed by GYN.  And per notes from GYN, patient is to be scheduled for hysterectomy due to the presence of uterine fibroids and patient requests removal of her ovaries as well.  7. Screening for colon cancer Patient is now 55 and has not had baseline colonoscopy.  Patient will be referred to gastroenterology regarding colonoscopy but wishes to have appointment later in the year due to her upcoming hysterectomy. - Ambulatory referral to Gastroenterology  An After Visit Summary was printed and given to the patient.  Follow-up: Return in about 3 months (around 07/16/2019), or if symptoms worsen or  fail to improve, for HTN.    Antony Blackbird, MD

## 2019-04-15 NOTE — Patient Instructions (Addendum)
Your EKG was normal. New medication added for control of blood pressure. Metoprolol 25 mg once per day and continue your current medications. Call if any problems or concerns. Your blood pressure will likely be checked at your pre-op appointment. Please call if still higher than 130/80. Hypertension, Adult Hypertension is another name for high blood pressure. High blood pressure forces your heart to work harder to pump blood. This can cause problems over time. There are two numbers in a blood pressure reading. There is a top number (systolic) over a bottom number (diastolic). It is best to have a blood pressure that is below 120/80. Healthy choices can help lower your blood pressure, or you may need medicine to help lower it. What are the causes? The cause of this condition is not known. Some conditions may be related to high blood pressure. What increases the risk?  Smoking.  Having type 2 diabetes mellitus, high cholesterol, or both.  Not getting enough exercise or physical activity.  Being overweight.  Having too much fat, sugar, calories, or salt (sodium) in your diet.  Drinking too much alcohol.  Having long-term (chronic) kidney disease.  Having a family history of high blood pressure.  Age. Risk increases with age.  Race. You may be at higher risk if you are African American.  Gender. Men are at higher risk than women before age 30. After age 50, women are at higher risk than men.  Having obstructive sleep apnea.  Stress. What are the signs or symptoms?  High blood pressure may not cause symptoms. Very high blood pressure (hypertensive crisis) may cause: ? Headache. ? Feelings of worry or nervousness (anxiety). ? Shortness of breath. ? Nosebleed. ? A feeling of being sick to your stomach (nausea). ? Throwing up (vomiting). ? Changes in how you see. ? Very bad chest pain. ? Seizures. How is this treated?  This condition is treated by making healthy lifestyle  changes, such as: ? Eating healthy foods. ? Exercising more. ? Drinking less alcohol.  Your health care provider may prescribe medicine if lifestyle changes are not enough to get your blood pressure under control, and if: ? Your top number is above 130. ? Your bottom number is above 80.  Your personal target blood pressure may vary. Follow these instructions at home: Eating and drinking   If told, follow the DASH eating plan. To follow this plan: ? Fill one half of your plate at each meal with fruits and vegetables. ? Fill one fourth of your plate at each meal with whole grains. Whole grains include whole-wheat pasta, brown rice, and whole-grain bread. ? Eat or drink low-fat dairy products, such as skim milk or low-fat yogurt. ? Fill one fourth of your plate at each meal with low-fat (lean) proteins. Low-fat proteins include fish, chicken without skin, eggs, beans, and tofu. ? Avoid fatty meat, cured and processed meat, or chicken with skin. ? Avoid pre-made or processed food.  Eat less than 1,500 mg of salt each day.  Do not drink alcohol if: ? Your doctor tells you not to drink. ? You are pregnant, may be pregnant, or are planning to become pregnant.  If you drink alcohol: ? Limit how much you use to:  0-1 drink a day for women.  0-2 drinks a day for men. ? Be aware of how much alcohol is in your drink. In the [place] one drink equals one 12 oz bottle of beer (355 mL), one 5 oz glass of wine (148  mL), or one 1 oz glass of hard liquor (44 mL). Lifestyle   Work with your doctor to stay at a healthy weight or to lose weight. Ask your doctor what the best weight is for you.  Get at least 30 minutes of exercise most days of the week. This may include walking, swimming, or biking.  Get at least 30 minutes of exercise that strengthens your muscles (resistance exercise) at least 3 days a week. This may include lifting weights or doing Pilates.  Do not use any products that  contain nicotine or tobacco, such as cigarettes, e-cigarettes, and chewing tobacco. If you need help quitting, ask your doctor.  Check your blood pressure at home as told by your doctor.  Keep all follow-up visits as told by your doctor. This is important. Medicines  Take over-the-counter and prescription medicines only as told by your doctor. Follow directions carefully.  Do not skip doses of blood pressure medicine. The medicine does not work as well if you skip doses. Skipping doses also puts you at risk for problems.  Ask your doctor about side effects or reactions to medicines that you should watch for. Contact a doctor if you:  Think you are having a reaction to the medicine you are taking.  Have headaches that keep coming back (recurring).  Feel dizzy.  Have swelling in your ankles.  Have trouble with your vision. Get help right away if you:  Get a very bad headache.  Start to feel mixed up (confused).  Feel weak or numb.  Feel faint.  Have very bad pain in your: ? Chest. ? Belly (abdomen).  Throw up more than once.  Have trouble breathing. Summary  Hypertension is another name for high blood pressure.  High blood pressure forces your heart to work harder to pump blood.  For most people, a normal blood pressure is less than 120/80.  Making healthy choices can help lower blood pressure. If your blood pressure does not get lower with healthy choices, you may need to take medicine. This information is not intended to replace advice given to you by your health care provider. Make sure you discuss any questions you have with your health care provider. Document Released: 01/23/2008 Document Revised: 04/16/2018 Document Reviewed: 04/16/2018 Elsevier Patient Education  2020 [employer].

## 2019-04-17 ENCOUNTER — Telehealth: Payer: Self-pay | Admitting: *Deleted

## 2019-04-17 ENCOUNTER — Telehealth: Payer: Self-pay | Admitting: Obstetrics & Gynecology

## 2019-04-17 NOTE — Telephone Encounter (Signed)
Becky Koch or Dr. Chapman Fitch-  Unable to complete the scheduling for the recommended CT abdomen and pelvis.  Per the scheduler, the order  needs to be with contrast. Unable to find the reason to support and complete the order.   Please complete pending order.    PER [place] REPORT-  3 mm area of decreased attenuation with shadowing in the subcutaneous region at the site of clinical concern in the left upper quadrant. A well-defined focus of calcification is not seen, although this shadowing suggest that this lesion probably is calcified. Given this circumstance, CT through this area may be advisable for more precise assessment.

## 2019-04-17 NOTE — Telephone Encounter (Signed)
I do not think that I placed this order. Can you call radiology and ask that the radiologist review the prior CT and patient's [place] just before the CT to see if the calcified uterine fibroid on pelvic US correlates with the abnormality seen on CT

## 2019-04-17 NOTE — Telephone Encounter (Signed)
Spoke to patient about her appointment on 8/31 @ 10:35. Patient instructed to wear a face mask for the entire appointment and no visitors are allowed with her during the visit. Patient screened for covid symptoms and denied having any

## 2019-04-20 ENCOUNTER — Ambulatory Visit: Payer: Self-pay | Admitting: Obstetrics & Gynecology

## 2019-04-21 ENCOUNTER — Other Ambulatory Visit: Payer: Self-pay | Admitting: Physician Assistant

## 2019-04-21 DIAGNOSIS — R1900 Intra-abdominal and pelvic swelling, mass and lump, unspecified site: Secondary | ICD-10-CM

## 2019-04-21 NOTE — Telephone Encounter (Signed)
Unable to leave message on patient voicemail- voice mail is full.   Any Available Medical Assistant may inform patient: Notes recorded by Argentina Donovan, PA-C on 04/15/2019 at 2:28 PM EDT  Please schedule CT(order is in). Please call patient and let her know the ultrasound did not show the area of concern well enough to diagnose it. The radiologist recommended CT. Thanks, Freeman Caldron, pA-C  I have scheduled the recommened  CT Scan:   05/01/2019@2 :30.  NPO 4 hours prior to exam. Pick up contrast bottle on T, W, Th of next week.

## 2019-04-21 NOTE — Telephone Encounter (Signed)
I have placed an order for CT with contrast. Thanks, Freeman Caldron, PA-C

## 2019-04-23 ENCOUNTER — Other Ambulatory Visit: Payer: Self-pay | Admitting: Family Medicine

## 2019-04-23 ENCOUNTER — Encounter: Payer: Self-pay | Admitting: *Deleted

## 2019-04-23 DIAGNOSIS — N949 Unspecified condition associated with female genital organs and menstrual cycle: Secondary | ICD-10-CM

## 2019-04-23 DIAGNOSIS — R1900 Intra-abdominal and pelvic swelling, mass and lump, unspecified site: Secondary | ICD-10-CM

## 2019-04-23 NOTE — Progress Notes (Signed)
Patient ID: Becky Koch, female   DOB: [birth year], 48 y.o.   MRN: [id number]   Patient has had recent order by another provider for CT of abd/pelvis without contrast but radiology is suggesting that test be done with contrast and new order will be placed.

## 2019-04-23 NOTE — Pre-Procedure Instructions (Signed)
[place]  04/23/2019      [place], [place] [address] [place] [address] Phone: [phone] Fax: [phone]    Your procedure is scheduled on Sept. 8  Report to [facility] Entrance A at 8:35 A.M.  Call this number if you have problems the morning of surgery:  [phone]   Remember:  Do not eat  after midnight.  You may drink clear liquids until 7:30 A.M..  Clear liquids allowed are:                    Water, Juice (non-citric and without pulp), Carbonated beverages, Clear Tea, Black Coffee only, Plain Jell-O only, Gatorade and Plain Popsicles only    Take these medicines the morning of surgery with A SIP OF WATER :              Amlodipine (norvasc)             megace             Metoprolol succinate  Omeprazole (prilosec) if needed        7 days prior to surgery STOP taking any Aspirin (unless otherwise instructed by your surgeon), Aleve, Naproxen, Ibuprofen, Motrin, Advil, Goody's, BC's, all herbal medications, fish oil, and all vitamins.  Do not wear jewelry, make-up or nail polish.  Do not wear lotions, powders, or perfumes, or deodorant.  Do not shave 48 hours prior to surgery.  Men may shave face and neck.  Do not bring valuables to the hospital.  [facility] is not responsible for any belongings or valuables.  Contacts, dentures or bridgework may not be worn into surgery.  Leave your suitcase in the car.  After surgery it may be brought to your room.  For patients admitted to the hospital, discharge time will be determined by your treatment team.  Patients discharged the day of surgery will not be allowed to drive home.    Special instructions:  [place]- Preparing For Surgery  Before surgery, you can play an important role. Because skin is not sterile, your skin needs to be as free of germs as possible. You can reduce the number of germs on your skin by washing with CHG  (chlorahexidine gluconate) Soap before surgery.  CHG is an antiseptic cleaner which kills germs and bonds with the skin to continue killing germs even after washing.    Oral Hygiene is also important to reduce your risk of infection.  Remember - BRUSH YOUR TEETH THE MORNING OF SURGERY WITH YOUR REGULAR TOOTHPASTE  Please do not use if you have an allergy to CHG or antibacterial soaps. If your skin becomes reddened/irritated stop using the CHG.  Do not shave (including legs and underarms) for at least 48 hours prior to first CHG shower. It is OK to shave your face.  Please follow these instructions carefully.   1. Shower the NIGHT BEFORE SURGERY and the MORNING OF SURGERY with CHG.   2. If you chose to wash your hair, wash your hair first as usual with your normal shampoo.  3. After you shampoo, rinse your hair and body thoroughly to remove the shampoo.  4. Use CHG as you would any other liquid soap. You can apply CHG directly to the skin and wash gently with a scrungie or a clean washcloth.   5. Apply the CHG Soap to your body ONLY FROM THE NECK DOWN.  Do not use on open wounds or open sores. Avoid contact with your eyes, ears, mouth and genitals (private parts). Wash Face and genitals (private parts)  with your normal soap.  6. Wash thoroughly, paying special attention to the area where your surgery will be performed.  7. Thoroughly rinse your body with warm water from the neck down.  8. DO NOT shower/wash with your normal soap after using and rinsing off the CHG Soap.  9. Pat yourself dry with a CLEAN TOWEL.  10. Wear CLEAN PAJAMAS to bed the night before surgery, wear comfortable clothes the morning of surgery  11. Place CLEAN SHEETS on your bed the night of your first shower and DO NOT SLEEP WITH PETS.    Day of Surgery:  Do not apply any deodorants/lotions.  Please wear clean clothes to the hospital/surgery center.   Remember to brush your teeth WITH YOUR REGULAR  TOOTHPASTE.    Please read over the following fact sheets that you were given. Coughing and Deep Breathing and Surgical Site Infection Prevention

## 2019-04-24 ENCOUNTER — Telehealth: Payer: Self-pay | Admitting: *Deleted

## 2019-04-24 ENCOUNTER — Inpatient Hospital Stay (HOSPITAL_COMMUNITY)
Admission: RE | Admit: 2019-04-24 | Discharge: 2019-04-24 | Disposition: A | Discharge status: Home / Self Care | Payer: Self-pay | Source: Ambulatory Visit

## 2019-04-24 NOTE — Telephone Encounter (Signed)
Patient name and DOB verified. Patient is aware of appointment for CT scan and directions to follow prior to procedure 05/01/2019@2 :30.  NPO 4 hours prior to exam. Pick up contrast bottle on T, W, Th of next week from [facility]   Patient verbalized understanding.

## 2019-04-24 NOTE — Progress Notes (Signed)
Patient missed her pre-admission appt today, stating that she has to get a biopsy done before she can have her hysterectomy. Left VM with Dr. Alease Medina office .

## 2019-04-24 NOTE — Progress Notes (Signed)
Called Patient to make sure she was coming to get Covid test done.  Patient states she missed her biopsy appt Monday 8/31 and they are going to have to reschedule her surgery.  She states Dr Alease Medina office is aware.

## 2019-04-27 ENCOUNTER — Encounter: Payer: Self-pay | Admitting: Family Medicine

## 2019-04-28 ENCOUNTER — Ambulatory Visit (HOSPITAL_COMMUNITY): Admission: RE | Admit: 2019-04-28 | Payer: Self-pay | Source: Home / Self Care | Admitting: Obstetrics & Gynecology

## 2019-04-28 ENCOUNTER — Encounter (HOSPITAL_COMMUNITY): Payer: Self-pay | Admitting: Anesthesiology

## 2019-04-28 ENCOUNTER — Encounter (HOSPITAL_COMMUNITY): Admission: RE | Payer: Self-pay | Source: Home / Self Care

## 2019-04-28 SURGERY — HYSTERECTOMY, VAGINAL
Anesthesia: Choice | Laterality: Bilateral

## 2019-04-28 NOTE — Anesthesia Preprocedure Evaluation (Deleted)
Anesthesia Evaluation  Patient identified by MRN, date of birth, ID band Patient awake    Reviewed: Allergy & Precautions, NPO status , Patient's Chart, lab work & pertinent test results, reviewed documented beta blocker date and time   Airway Mallampati: II  TM Distance: >3 FB Neck ROM: Full    Dental no notable dental hx.    Pulmonary neg pulmonary ROS, Current Smoker,    Pulmonary exam normal breath sounds clear to auscultation       Cardiovascular hypertension, Pt. on medications and Pt. on home beta blockers negative cardio ROS Normal cardiovascular exam Rhythm:Regular Rate:Normal     Neuro/Psych negative neurological ROS  negative psych ROS   GI/Hepatic Neg liver ROS, GERD  Medicated,  Endo/Other  negative endocrine ROS  Renal/GU negative Renal ROS  negative genitourinary   Musculoskeletal negative musculoskeletal ROS (+)   Abdominal   Peds negative pediatric ROS (+)  Hematology negative hematology ROS (+)   Anesthesia Other Findings   Reproductive/Obstetrics negative OB ROS                             Anesthesia Physical Anesthesia Plan  ASA: II  Anesthesia Plan: General   Post-op Pain Management:    Induction: Intravenous  PONV Risk Score and Plan: 3 and Ondansetron, Dexamethasone and Midazolam  Airway Management Planned: Oral ETT  Additional Equipment: None  Intra-op Plan:   Post-operative Plan: Extubation in OR  Informed Consent: I have reviewed the patients History and Physical, chart, labs and discussed the procedure including the risks, benefits and alternatives for the proposed anesthesia with the patient or authorized representative who has indicated his/her understanding and acceptance.     Dental advisory given  Plan Discussed with: CRNA  Anesthesia Plan Comments:         Anesthesia Quick Evaluation

## 2019-05-01 ENCOUNTER — Ambulatory Visit (HOSPITAL_COMMUNITY)
Admission: RE | Admit: 2019-05-01 | Discharge: 2019-05-01 | Disposition: A | Discharge status: Home / Self Care | Payer: Self-pay | Source: Ambulatory Visit | Attending: Physician Assistant | Admitting: Physician Assistant

## 2019-05-01 ENCOUNTER — Other Ambulatory Visit: Payer: Self-pay

## 2019-05-01 DIAGNOSIS — R1900 Intra-abdominal and pelvic swelling, mass and lump, unspecified site: Secondary | ICD-10-CM | POA: Insufficient documentation

## 2019-05-01 MED ORDER — IOHEXOL 300 MG/ML  SOLN
100.0000 mL | Freq: Once | INTRAMUSCULAR | Status: AC | PRN
  Administered 2019-05-01: 100 mL via INTRAVENOUS

## 2019-05-11 MED FILL — ?AMLODIPINE BESYLATE 10 MG: 10 | 30 days supply | Qty: 30 | Fill #2

## 2019-05-11 MED FILL — MEGESTROL 40 MG TABLET: 40 | 30 days supply | Qty: 60 | Fill #4

## 2019-05-11 MED FILL — ?OMEPRAZOLE 20MG CAP DR: 20 | 30 days supply | Qty: 30 | Fill #3

## 2019-05-11 MED FILL — ?HYDROCHLOROTHIAZIDE 25MG T: 25 | 30 days supply | Qty: 30 | Fill #2

## 2019-05-11 MED FILL — ?traZODONE HCL 50MG TAB: 50 | 30 days supply | Qty: 30 | Fill #2

## 2019-05-11 MED FILL — ?METOPROLOL SUCC ER 25MG TA: 25 | 30 days supply | Qty: 30 | Fill #1

## 2019-05-13 ENCOUNTER — Encounter: Payer: Self-pay | Admitting: Gastroenterology

## 2019-05-26 ENCOUNTER — Encounter: Payer: Self-pay | Admitting: *Deleted

## 2019-05-26 ENCOUNTER — Telehealth: Payer: Self-pay | Admitting: *Deleted

## 2019-05-26 DIAGNOSIS — Z86018 Personal history of other benign neoplasm: Secondary | ICD-10-CM

## 2019-05-26 DIAGNOSIS — N946 Dysmenorrhea, unspecified: Secondary | ICD-10-CM

## 2019-05-26 MED ORDER — MISOPROSTOL 200 MCG PO TABS: ORAL_TABLET | ORAL | 0 refills | Status: DC

## 2019-05-26 NOTE — Telephone Encounter (Signed)
Received a fax from [facility] for a refill request for misoprostol .  I called Becky Koch to see if she had ever filled this rx.  She states she has never filled it. We discussed she will need to take this before her biopsy scheduled for 07/01/19. I explained I had gotten a refill request from her pharmacy and I will call to make sure she can still pick up rx.  I called [facility] and they informed me that rx was closed; can not see that it was ever picked up and I would need to send in new rx.  I sent in new rx and will send message to patient to pick up by 7 days . I did clarify which pharmacy she wants it sent to and she confirms [place] not [place].  [name],RN

## 2019-05-28 ENCOUNTER — Other Ambulatory Visit: Payer: Self-pay

## 2019-05-28 ENCOUNTER — Ambulatory Visit (AMBULATORY_SURGERY_CENTER): Payer: Self-pay | Admitting: *Deleted

## 2019-05-28 VITALS — Temp 97.3°F | Ht 66.0 in | Wt 168.0 lb

## 2019-05-28 DIAGNOSIS — Z1211 Encounter for screening for malignant neoplasm of colon: Secondary | ICD-10-CM

## 2019-05-28 MED ORDER — DULCOLAX 5 MG PO TBEC: 5.0000 mg | DELAYED_RELEASE_TABLET | Freq: Once | ORAL | 0 refills | Status: DC

## 2019-05-28 MED ORDER — PEG 3350-KCL-NA BICARB-NACL 420 G PO SOLR: 4000.0000 mL | Freq: Once | ORAL | 0 refills | Status: AC

## 2019-05-28 MED FILL — PEG-3350 SOLUTION: 420 | 1 days supply | Qty: 4000 | Fill #0

## 2019-05-28 NOTE — Progress Notes (Signed)

## 2019-06-11 ENCOUNTER — Telehealth: Payer: Self-pay

## 2019-06-11 NOTE — Telephone Encounter (Signed)
Covid-19 screening questions   Do you now or have you had a fever in the last 14 days? NO   Do you have any respiratory symptoms of shortness of breath or cough now or in the last 14 days? NO  Do you have any family members or close contacts with diagnosed or suspected Covid-19 in the past 14 days? NO  Have you been tested for Covid-19 and found to be positive? NO        

## 2019-06-12 ENCOUNTER — Ambulatory Visit (AMBULATORY_SURGERY_CENTER): Payer: Self-pay | Admitting: Gastroenterology

## 2019-06-12 ENCOUNTER — Other Ambulatory Visit: Payer: Self-pay

## 2019-06-12 ENCOUNTER — Encounter: Payer: Self-pay | Admitting: Gastroenterology

## 2019-06-12 VITALS — BP 124/81 | HR 83 | Temp 98.7°F | Resp 23 | Ht 66.0 in | Wt 168.0 lb

## 2019-06-12 DIAGNOSIS — D127 Benign neoplasm of rectosigmoid junction: Secondary | ICD-10-CM

## 2019-06-12 DIAGNOSIS — D122 Benign neoplasm of ascending colon: Secondary | ICD-10-CM

## 2019-06-12 DIAGNOSIS — D128 Benign neoplasm of rectum: Secondary | ICD-10-CM

## 2019-06-12 DIAGNOSIS — Z1211 Encounter for screening for malignant neoplasm of colon: Secondary | ICD-10-CM

## 2019-06-12 DIAGNOSIS — D123 Benign neoplasm of transverse colon: Secondary | ICD-10-CM

## 2019-06-12 DIAGNOSIS — D124 Benign neoplasm of descending colon: Secondary | ICD-10-CM

## 2019-06-12 MED ORDER — SODIUM CHLORIDE 0.9 % IV SOLN: 500.0000 mL | Freq: Once | INTRAVENOUS | Status: DC

## 2019-06-12 NOTE — Progress Notes (Signed)
Report to PACU, RN, vss, BBS= Clear.  

## 2019-06-12 NOTE — Progress Notes (Addendum)
[facility]  Pt's states no medical or surgical changes since previsit or office visit.  Pt has a piercing in her vagina.  I asked Dr. Rush Landmark if it needed to be removed.  He said it would be best if it could be removed in case cautery was needed.  Pt advised and she tried to removed the piercing, but was not successful.  Josh Monday, CRNA, and [employer] and [employer] were notified. maw

## 2019-06-12 NOTE — Patient Instructions (Signed)
YOU HAD AN ENDOSCOPIC PROCEDURE TODAY AT THE [facility]:   Refer to the procedure report that was given to you for any specific questions about what was found during the examination.  If the procedure report does not answer your questions, please call your gastroenterologist to clarify.  If you requested that your care partner not be given the details of your procedure findings, then the procedure report has been included in a sealed envelope for you to review at your convenience later.  YOU SHOULD EXPECT: Some feelings of bloating in the abdomen. Passage of more gas than usual.  Walking can help get rid of the air that was put into your GI tract during the procedure and reduce the bloating. If you had a lower endoscopy (such as a colonoscopy or flexible sigmoidoscopy) you may notice spotting of blood in your stool or on the toilet paper. If you underwent a bowel prep for your procedure, you may not have a normal bowel movement for a few days.  Please Note:  You might notice some irritation and congestion in your nose or some drainage.  This is from the oxygen used during your procedure.  There is no need for concern and it should clear up in a day or so.  SYMPTOMS TO REPORT IMMEDIATELY:   Following lower endoscopy (colonoscopy or flexible sigmoidoscopy):  Excessive amounts of blood in the stool  Significant tenderness or worsening of abdominal pains  Swelling of the abdomen that is new, acute  Fever of 100F or higher  For urgent or emergent issues, a gastroenterologist can be reached at any hour by calling [phone].   DIET:  We do recommend a small meal at first, but then you may proceed to your regular diet.  Drink plenty of fluids but you should avoid alcoholic beverages for 24 hours.  ACTIVITY:  You should plan to take it easy for the rest of today and you should NOT DRIVE or use heavy machinery until tomorrow (because of the sedation medicines used during the test).     FOLLOW UP: Our staff will call the number listed on your records 48-72 hours following your procedure to check on you and address any questions or concerns that you may have regarding the information given to you following your procedure. If we do not reach you, we will leave a message.  We will attempt to reach you two times.  During this call, we will ask if you have developed any symptoms of COVID 19. If you develop any symptoms (ie: fever, flu-like symptoms, shortness of breath, cough etc.) before then, please call [phone].  If you test positive for Covid 19 in the 2 weeks post procedure, please call and report this information to us.    If any biopsies were taken you will be contacted by phone or by letter within the next 1-3 weeks.  Please call us at [phone] if you have not heard about the biopsies in 3 weeks.    SIGNATURES/CONFIDENTIALITY: You and/or your care partner have signed paperwork which will be entered into your electronic medical record.  These signatures attest to the fact that that the information above on your After Visit Summary has been reviewed and is understood.  Full responsibility of the confidentiality of this discharge information lies with you and/or your care-partner. 

## 2019-06-12 NOTE — Progress Notes (Signed)
Called to room to assist during endoscopic procedure.  Patient ID and intended procedure confirmed with present staff. Received instructions for my participation in the procedure from the performing physician.  

## 2019-06-12 NOTE — Op Note (Signed)
[place] Patient Name: Becky Koch Procedure Date: 06/12/2019 3:14 PM MRN: [id number] Endoscopist: Justice Britain , MD Age: 48 Referring MD:  Date of Birth: 01-22-1971 Gender: Female Account #: [id number] Procedure:                Colonoscopy Indications:              Screening for colorectal malignant neoplasm Medicines:                Monitored Anesthesia Care Procedure:                Pre-Anesthesia Assessment:                           - Prior to the procedure, a History and Physical                            was performed, and patient medications and                            allergies were reviewed. The patient's tolerance of                            previous anesthesia was also reviewed. The risks                            and benefits of the procedure and the sedation                            options and risks were discussed with the patient.                            All questions were answered, and informed consent                            was obtained. Prior Anticoagulants: The patient has                            taken no previous anticoagulant or antiplatelet                            agents except for NSAID medication. ASA Grade                            Assessment: II - A patient with mild systemic                            disease. After reviewing the risks and benefits,                            the patient was deemed in satisfactory condition to                            undergo the procedure.  After obtaining informed consent, the colonoscope                            was passed under direct vision. Throughout the                            procedure, the patient's blood pressure, pulse, and                            oxygen saturations were monitored continuously. The                            Colonoscope was introduced through the anus and                            advanced to the 5 cm into the ileum.  The                            colonoscopy was performed without difficulty. The                            patient tolerated the procedure. The quality of the                            bowel preparation was good. The terminal ileum,                            ileocecal valve, appendiceal orifice, and rectum                            were photographed. Scope In: 3:20:41 PM Scope Out: 3:44:08 PM Scope Withdrawal Time: 0 hours 18 minutes 44 seconds  Total Procedure Duration: 0 hours 23 minutes 27 seconds  Findings:                 The digital rectal exam findings include                            hemorrhoids. Pertinent negatives include no                            palpable rectal lesions.                           The terminal ileum and ileocecal valve appeared                            normal.                           Four sessile polyps were found in the rectum (1),                            descending colon (1), transverse colon (1) and  ascending colon (1). The polyps were 2 to 4 mm in                            size. These polyps were removed with a cold snare.                            Resection and retrieval were complete.                           A 10 mm polyp was found in the recto-sigmoid colon.                            The polyp was semi-sessile. The polyp was removed                            with a hot snare. Resection and retrieval were                            complete.                           A few small-mouthed diverticula were found in the                            sigmoid colon and descending colon.                           Normal mucosa was found in the entire colon                            otherwise.                           Non-bleeding non-thrombosed internal hemorrhoids                            were found during retroflexion, during perianal                            exam and during digital exam. The hemorrhoids  were                            Grade II (internal hemorrhoids that prolapse but                            reduce spontaneously). Complications:            No immediate complications. Estimated Blood Loss:     Estimated blood loss was minimal. Impression:               - Hemorrhoids found on digital rectal exam.                           - The examined portion of the ileum was normal.                           -  Four 2 to 4 mm polyps in the rectum, in the                            descending colon, in the transverse colon and in                            the ascending colon, removed with a cold snare.                            Resected and retrieved.                           - One 10 mm polyp at the recto-sigmoid colon,                            removed with a hot snare. Resected and retrieved.                           - Diverticulosis in the sigmoid colon and in the                            descending colon.                           - Normal mucosa in the entire examined colon                            otherwise.                           - Non-bleeding non-thrombosed internal hemorrhoids. Recommendation:           - The patient will be observed post-procedure,                            until all discharge criteria are met.                           - Discharge patient to home.                           - Patient has a contact number available for                            emergencies. The signs and symptoms of potential                            delayed complications were discussed with the                            patient. Return to normal activities tomorrow.                            Written discharge instructions were provided to the  patient.                           - High fiber diet.                           - Use FiberCon 1 tablet PO daily.                           - Continue present medications.                           - Await  pathology results.                           - Repeat colonoscopy in 3 years for surveillance                            most likely based on final pathology.                           - The findings and recommendations were discussed                            with the patient. Justice Britain, MD 06/12/2019 3:52:05 PM

## 2019-06-16 ENCOUNTER — Telehealth: Payer: Self-pay

## 2019-06-16 ENCOUNTER — Other Ambulatory Visit: Payer: Self-pay | Admitting: Physician Assistant

## 2019-06-16 ENCOUNTER — Telehealth: Payer: Self-pay | Admitting: *Deleted

## 2019-06-16 ENCOUNTER — Other Ambulatory Visit: Payer: Self-pay | Admitting: Primary Care

## 2019-06-16 DIAGNOSIS — I10 Essential (primary) hypertension: Secondary | ICD-10-CM

## 2019-06-16 MED FILL — ?AMLODIPINE BESYLATE 10 MG: 10 | 30 days supply | Qty: 30 | Fill #0

## 2019-06-16 MED FILL — ?OMEPRAZOLE 20MG CAP DR: 20 | 30 days supply | Qty: 30 | Fill #0

## 2019-06-16 MED FILL — ?HYDROCHLOROTHIAZIDE 25MG T: 25 | 30 days supply | Qty: 30 | Fill #0

## 2019-06-16 MED FILL — ?METOPROLOL SUCC ER 25MG TA: 25 | 30 days supply | Qty: 30 | Fill #2

## 2019-06-16 MED FILL — MEGESTROL 40 MG TABLET: 40 | 30 days supply | Qty: 60 | Fill #5

## 2019-06-16 NOTE — Telephone Encounter (Signed)
Attempted to reach patient for post-procedure f/u call. No answer. Mailbox full, unable to leave message.

## 2019-06-16 NOTE — Telephone Encounter (Signed)
Please fill if appropriate. Nat Christen, CMA

## 2019-06-16 NOTE — Telephone Encounter (Signed)
  Follow up Call-  Call back number 06/12/2019  Post procedure Call Back phone  # [phone] cell  Permission to leave phone message Yes  Some recent data might be hidden     Patient questions:  Do you have a fever, pain , or abdominal swelling? No. Pain Score  0 *  Have you tolerated food without any problems? Yes.    Have you been able to return to your normal activities? Yes.    Do you have any questions about your discharge instructions: Diet   No. Medications  No. Follow up visit  No.  Do you have questions or concerns about your Care? Yes.    Actions: * If pain score is 4 or above: No action needed, pain <4.  1. Have you developed a fever since your procedure? no  2.   Have you had an respiratory symptoms (SOB or cough) since your procedure? no  3.   Have you tested positive for COVID 19 since your procedure no  4.   Have you had any family members/close contacts diagnosed with the COVID 19 since your procedure?  no   If yes to any of these questions please route to Joylene John, RN and Alphonsa Gin, Therapist, sports.

## 2019-06-18 ENCOUNTER — Encounter: Payer: Self-pay | Admitting: Gastroenterology

## 2019-07-01 ENCOUNTER — Ambulatory Visit (INDEPENDENT_AMBULATORY_CARE_PROVIDER_SITE_OTHER): Payer: Self-pay | Admitting: Obstetrics & Gynecology

## 2019-07-01 ENCOUNTER — Encounter: Payer: Self-pay | Admitting: Obstetrics & Gynecology

## 2019-07-01 ENCOUNTER — Other Ambulatory Visit: Payer: Self-pay

## 2019-07-01 ENCOUNTER — Other Ambulatory Visit (HOSPITAL_COMMUNITY)
Admission: RE | Admit: 2019-07-01 | Discharge: 2019-07-01 | Disposition: A | Discharge status: Home / Self Care | Payer: Self-pay | Source: Ambulatory Visit | Attending: Obstetrics & Gynecology | Admitting: Obstetrics & Gynecology

## 2019-07-01 VITALS — BP 140/99 | HR 111 | Wt 165.9 lb

## 2019-07-01 DIAGNOSIS — N920 Excessive and frequent menstruation with regular cycle: Secondary | ICD-10-CM

## 2019-07-01 MED ORDER — METRONIDAZOLE 500 MG PO TABS: 500.0000 mg | ORAL_TABLET | Freq: Two times a day (BID) | ORAL | 0 refills | Status: DC

## 2019-07-01 MED FILL — metroNIDAZOLE 500 MG TABS: 500 | 7 days supply | Qty: 14 | Fill #0

## 2019-07-01 NOTE — Progress Notes (Signed)
   Subjective:    Patient ID: Becky Koch, female    DOB: 1971-02-01, 48 y.o.   MRN: [id number]  HPI 48 yo married P4 (8 grands) here  for an [facility] She has wanted a hysterectomy due to pelvic pain, heavy periods, and dyspareunia. her desire for a hysterectomy  Ultrasound showed uterine enlargement with fibroids.   Review of Systems    pap normal 12/19 Objective:   Physical Exam Breathing, conversing, and ambulating normally Well nourished, well hydrated Black female, no apparent distress Abd- benign Spec exam shows vaginal discharge c/w BV  UPT negative, consent signed, time out done Cervix prepped with betadine and grasped with a single tooth tenaculum Uterus sounded to 9 cm Pipelle used for 2 passes with a moderate amount of tissue obtained. She tolerated the procedure well.      Assessment & Plan:  Menorrhagia, fibroids, pelvic pain, dyspareunia- plan for TVH/BS (possible BSO) BV- treat with flagyl

## 2019-07-01 NOTE — Progress Notes (Signed)
Pt called and notified of need to come into front office to sign Hysterectomy Statement prior to surgery. Pt verbalizes understanding.  Apolonio Schneiders RN 07/01/19

## 2019-07-03 LAB — SURGICAL PATHOLOGY

## 2019-07-20 ENCOUNTER — Ambulatory Visit: Payer: Self-pay | Admitting: Family Medicine

## 2019-07-20 MED FILL — ?HYDROCHLOROTHIAZIDE 25MG T: 25 | 30 days supply | Qty: 30 | Fill #1

## 2019-07-20 MED FILL — ?METOPROLOL SUCC ER 25MG TA: 25 | 30 days supply | Qty: 30 | Fill #3

## 2019-07-20 MED FILL — ?traZODONE HCL 50MG TAB: 50 | 30 days supply | Qty: 30 | Fill #3

## 2019-07-20 MED FILL — ?OMEPRAZOLE 20MG CAP DR: 20 | 30 days supply | Qty: 30 | Fill #1

## 2019-07-20 MED FILL — ?AMLODIPINE BESYLATE 10 MG: 10 | 30 days supply | Qty: 30 | Fill #1

## 2019-07-21 ENCOUNTER — Encounter (HOSPITAL_BASED_OUTPATIENT_CLINIC_OR_DEPARTMENT_OTHER): Payer: Self-pay | Admitting: *Deleted

## 2019-07-21 ENCOUNTER — Encounter: Payer: Self-pay | Admitting: *Deleted

## 2019-07-21 ENCOUNTER — Other Ambulatory Visit: Payer: Self-pay

## 2019-07-24 ENCOUNTER — Other Ambulatory Visit: Payer: Self-pay

## 2019-07-24 ENCOUNTER — Ambulatory Visit: Payer: Self-pay | Attending: Family Medicine | Admitting: Family Medicine

## 2019-07-24 ENCOUNTER — Other Ambulatory Visit (HOSPITAL_COMMUNITY)
Admission: RE | Admit: 2019-07-24 | Discharge: 2019-07-24 | Disposition: A | Discharge status: Home / Self Care | Payer: Self-pay | Source: Ambulatory Visit | Attending: Obstetrics & Gynecology | Admitting: Obstetrics & Gynecology

## 2019-07-24 ENCOUNTER — Encounter: Payer: Self-pay | Admitting: Family Medicine

## 2019-07-24 DIAGNOSIS — R739 Hyperglycemia, unspecified: Secondary | ICD-10-CM

## 2019-07-24 DIAGNOSIS — R350 Frequency of micturition: Secondary | ICD-10-CM

## 2019-07-24 DIAGNOSIS — Z20828 Contact with and (suspected) exposure to other viral communicable diseases: Secondary | ICD-10-CM | POA: Insufficient documentation

## 2019-07-24 DIAGNOSIS — R300 Dysuria: Secondary | ICD-10-CM

## 2019-07-24 DIAGNOSIS — Z01812 Encounter for preprocedural laboratory examination: Secondary | ICD-10-CM | POA: Insufficient documentation

## 2019-07-24 MED ORDER — SULFAMETHOXAZOLE-TRIMETHOPRIM 800-160 MG PO TABS: 1.0000 | ORAL_TABLET | Freq: Two times a day (BID) | ORAL | 0 refills | Status: DC

## 2019-07-24 MED ORDER — FLUCONAZOLE 150 MG PO TABS: 150.0000 mg | ORAL_TABLET | Freq: Once | ORAL | 0 refills | Status: AC

## 2019-07-24 NOTE — Progress Notes (Signed)
Patient verified DOB Patient has eaten today/ Patient has taken medication today. Patient denies pain at this time. Patient is scheduled for for hysterectomy on 07/28/2019

## 2019-07-24 NOTE — Progress Notes (Signed)
Virtual Visit via Telephone Note  I connected with Becky Koch on 07/24/19 at  3:10 PM EST by telephone and verified that I am speaking with the correct person using two identifiers.   I discussed the limitations, risks, security and privacy concerns of performing an evaluation and management service by telephone and the availability of in person appointments. I also discussed with the patient that there may be a patient responsible charge related to this service. The patient expressed understanding and agreed to proceed.  Patient Location: Home Provider Location: [facility] Others participating in call: none   History of Present Illness:      48 yo female who states that about 3 nights ago she had some onset of burning with urination at bedtime and the next day at work she had frequent urination and mild lower abdominal discomfort at the end of urination. Yesterday after work she bought medication from the pharmacy that lessened the burning but still have frequent urination. She feels as if she has a UTI. She is scheduled for hysterectomy on December 8th. Patient not aware of blood sugar of 147 on lab work done in July of this year.  She does not feel as if she is having increased thirst.  She denies any blurred vision.  No increased back pain and no fever or chills.   Past Medical History:  Diagnosis Date  . Allergy   . Anemia   . Blood transfusion without reported diagnosis   . Fibroid, uterine   . GERD (gastroesophageal reflux disease)   . Hypertension     Past Surgical History:  Procedure Laterality Date  . TUBAL LIGATION      Family History  Problem Relation Age of Onset  . Cancer Paternal Grandfather   . Colon cancer Neg Hx   . Colon polyps Neg Hx   . Esophageal cancer Neg Hx   . Rectal cancer Neg Hx   . Stomach cancer Neg Hx     Social History   Tobacco Use  . Smoking status: Former Smoker    Packs/day: 0.50    Types: Cigarettes    Quit date: 03/21/2019    Years  since quitting: 0.3  . Smokeless tobacco: Never Used  Substance Use Topics  . Alcohol use: Yes    Comment: social alcohol  . Drug use: No     No Known Allergies     Observations/Objective: No vital signs or physical exam conducted as visit was done via telephone  Assessment and Plan: 1. Urinary frequency She has been asked to come into the office to have hemoglobin A1c due to her complaint of urinary frequency and she has had elevated blood sugar of 147 on blood work done on 02/18/2019.  - Hemoglobin A1C; Future  2. Dysuria Patient with complaint of dysuria and urinary frequency.  She believes that she likely has a urinary tract infection.  Prescription will be sent to her pharmacy for Bactrim DS twice daily x3 days which would treat uncomplicated urinary tract infection.  She should follow-up in the next few days if her symptoms do not resolve.  Prescription for Diflucan in case of yeast infection after use of antibiotic. -Bactrim DS; 1 tablet twice daily x3 days.  Dispense: 6 tablets; Refill 0 - fluconazole (DIFLUCAN) 150 MG tablet; Take 1 tablet (150 mg total) by mouth once for 1 dose. If needed for yeast infection  Dispense: 1 tablet; Refill: 0  3. Elevated blood sugar level She has been asked  to make an appointment at her convenience in the next few weeks to have hemoglobin A1c in follow-up of prior elevated blood sugars as well as complaint of urinary frequency. - Hemoglobin A1C; Future  Follow Up Instructions:Return for lab visit in the next 4-6 weeks; f/u sooner if continued symptoms.    I discussed the assessment and treatment plan with the patient. The patient was provided an opportunity to ask questions and all were answered. The patient agreed with the plan and demonstrated an understanding of the instructions.   The patient was advised to call back or seek an in-person evaluation if the symptoms worsen or if the condition fails to improve as anticipated.  I provided 11  minutes of non-face-to-face time during this encounter.   Antony Blackbird, MD

## 2019-07-25 LAB — NOVEL CORONAVIRUS, NAA (HOSP ORDER, SEND-OUT TO REF LAB; TAT 18-24 HRS): SARS-CoV-2, NAA: NOT DETECTED

## 2019-07-27 ENCOUNTER — Encounter (HOSPITAL_BASED_OUTPATIENT_CLINIC_OR_DEPARTMENT_OTHER)
Admission: RE | Admit: 2019-07-27 | Discharge: 2019-07-27 | Disposition: A | Discharge status: Home / Self Care | Payer: Self-pay | Source: Ambulatory Visit | Attending: Obstetrics & Gynecology | Admitting: Obstetrics & Gynecology

## 2019-07-27 ENCOUNTER — Other Ambulatory Visit: Payer: Self-pay

## 2019-07-27 ENCOUNTER — Encounter (HOSPITAL_COMMUNITY)
Admission: RE | Admit: 2019-07-27 | Discharge: 2019-07-27 | Disposition: A | Discharge status: Home / Self Care | Payer: Self-pay | Source: Ambulatory Visit | Attending: Obstetrics & Gynecology | Admitting: Obstetrics & Gynecology

## 2019-07-27 ENCOUNTER — Encounter: Payer: Self-pay | Admitting: Obstetrics & Gynecology

## 2019-07-27 DIAGNOSIS — Z01812 Encounter for preprocedural laboratory examination: Secondary | ICD-10-CM | POA: Insufficient documentation

## 2019-07-27 LAB — POCT PREGNANCY, URINE: Preg Test, Ur: NEGATIVE

## 2019-07-27 LAB — TYPE AND SCREEN
ABO/RH(D): A POS
Antibody Screen: NEGATIVE

## 2019-07-27 LAB — ABO/RH: ABO/RH(D): A POS

## 2019-07-27 NOTE — Anesthesia Preprocedure Evaluation (Addendum)
Anesthesia Evaluation  Patient identified by MRN, date of birth, ID band Patient awake    Reviewed: Allergy & Precautions, NPO status , Patient's Chart, lab work & pertinent test results  Airway Mallampati: I  TM Distance: >3 FB Neck ROM: Full    Dental no notable dental hx. (+) Teeth Intact, Dental Advisory Given   Pulmonary former smoker,    Pulmonary exam normal breath sounds clear to auscultation       Cardiovascular Exercise Tolerance: Good hypertension, Pt. on medications and Pt. on home beta blockers Normal cardiovascular exam Rhythm:Regular Rate:Normal     Neuro/Psych negative neurological ROS  negative psych ROS   GI/Hepatic GERD  ,  Endo/Other  negative endocrine ROS  Renal/GU negative Renal ROS     Musculoskeletal negative musculoskeletal ROS (+)   Abdominal   Peds  Hematology T&S   Anesthesia Other Findings   Reproductive/Obstetrics negative OB ROS                            Anesthesia Physical Anesthesia Plan  ASA: II  Anesthesia Plan: General   Post-op Pain Management:    Induction: Intravenous  PONV Risk Score and Plan: 4 or greater and Treatment may vary due to age or medical condition, Ondansetron, Dexamethasone, Scopolamine patch - Pre-op and Midazolam  Airway Management Planned: Oral ETT  Additional Equipment: None  Intra-op Plan:   Post-operative Plan: Extubation in OR  Informed Consent: I have reviewed the patients History and Physical, chart, labs and discussed the procedure including the risks, benefits and alternatives for the proposed anesthesia with the patient or authorized representative who has indicated his/her understanding and acceptance.     Dental advisory given  Plan Discussed with:   Anesthesia Plan Comments:        Anesthesia Quick Evaluation

## 2019-07-27 NOTE — Progress Notes (Signed)
      Enhanced Recovery after Surgery for Orthopedics Enhanced Recovery after Surgery is a protocol used to improve the stress on your body and your recovery after surgery.  Patient Instructions  . The night before surgery:  o No food after midnight. ONLY clear liquids after midnight  . The day of surgery (if you do NOT have diabetes):  o Drink ONE (1) Pre-Surgery Clear Ensure as directed.   o This drink was given to you during your hospital  pre-op appointment visit. o The pre-op nurse will instruct you on the time to drink the  Pre-Surgery Ensure depending on your surgery time. o Finish the drink at the designated time by the pre-op nurse.  o Nothing else to drink after completing the  Pre-Surgery Clear Ensure.  . The day of surgery (if you have diabetes): o Drink ONE (1) Gatorade 2 (G2) as directed. o This drink was given to you during your hospital  pre-op appointment visit.  o The pre-op nurse will instruct you on the time to drink the   Gatorade 2 (G2) depending on your surgery time. o Color of the Gatorade may vary. Red is not allowed. o Nothing else to drink after completing the  Gatorade 2 (G2).         If you have questions, please contact your surgeon's office.  Finish by [id number] [phone]

## 2019-07-28 ENCOUNTER — Other Ambulatory Visit: Payer: Self-pay

## 2019-07-28 ENCOUNTER — Ambulatory Visit (HOSPITAL_COMMUNITY)
Admission: RE | Admit: 2019-07-28 | Discharge: 2019-07-28 | Disposition: A | Discharge status: Home / Self Care | Payer: Self-pay | Attending: Obstetrics & Gynecology | Admitting: Obstetrics & Gynecology

## 2019-07-28 ENCOUNTER — Ambulatory Visit (HOSPITAL_BASED_OUTPATIENT_CLINIC_OR_DEPARTMENT_OTHER): Payer: Self-pay | Admitting: Certified Registered Nurse Anesthetist

## 2019-07-28 ENCOUNTER — Encounter (HOSPITAL_BASED_OUTPATIENT_CLINIC_OR_DEPARTMENT_OTHER)
Admission: RE | Disposition: A | Discharge status: Home / Self Care | Payer: Self-pay | Source: Home / Self Care | Attending: Obstetrics & Gynecology

## 2019-07-28 ENCOUNTER — Encounter: Payer: Self-pay | Admitting: *Deleted

## 2019-07-28 ENCOUNTER — Encounter (HOSPITAL_BASED_OUTPATIENT_CLINIC_OR_DEPARTMENT_OTHER): Payer: Self-pay | Admitting: *Deleted

## 2019-07-28 DIAGNOSIS — K219 Gastro-esophageal reflux disease without esophagitis: Secondary | ICD-10-CM | POA: Insufficient documentation

## 2019-07-28 DIAGNOSIS — D259 Leiomyoma of uterus, unspecified: Secondary | ICD-10-CM

## 2019-07-28 DIAGNOSIS — R102 Pelvic and perineal pain: Secondary | ICD-10-CM

## 2019-07-28 DIAGNOSIS — I10 Essential (primary) hypertension: Secondary | ICD-10-CM | POA: Insufficient documentation

## 2019-07-28 DIAGNOSIS — Z79899 Other long term (current) drug therapy: Secondary | ICD-10-CM | POA: Insufficient documentation

## 2019-07-28 DIAGNOSIS — Z9889 Other specified postprocedural states: Secondary | ICD-10-CM

## 2019-07-28 DIAGNOSIS — N941 Unspecified dyspareunia: Secondary | ICD-10-CM

## 2019-07-28 DIAGNOSIS — Z87891 Personal history of nicotine dependence: Secondary | ICD-10-CM | POA: Insufficient documentation

## 2019-07-28 DIAGNOSIS — N920 Excessive and frequent menstruation with regular cycle: Secondary | ICD-10-CM | POA: Insufficient documentation

## 2019-07-28 DIAGNOSIS — Z791 Long term (current) use of non-steroidal anti-inflammatories (NSAID): Secondary | ICD-10-CM | POA: Insufficient documentation

## 2019-07-28 DIAGNOSIS — N938 Other specified abnormal uterine and vaginal bleeding: Secondary | ICD-10-CM | POA: Diagnosis present

## 2019-07-28 DIAGNOSIS — Z809 Family history of malignant neoplasm, unspecified: Secondary | ICD-10-CM | POA: Insufficient documentation

## 2019-07-28 DIAGNOSIS — D649 Anemia, unspecified: Secondary | ICD-10-CM | POA: Insufficient documentation

## 2019-07-28 DIAGNOSIS — N800 Endometriosis of uterus: Secondary | ICD-10-CM | POA: Insufficient documentation

## 2019-07-28 HISTORY — PX: VAGINAL HYSTERECTOMY: SHX2639

## 2019-07-28 LAB — BASIC METABOLIC PANEL
Anion gap: 13 (ref 5–15)
BUN: 9 mg/dL (ref 6–20)
CO2: 21 mmol/L — ABNORMAL LOW (ref 22–32)
Calcium: 9.7 mg/dL (ref 8.9–10.3)
Chloride: 103 mmol/L (ref 98–111)
Creatinine, Ser: 0.86 mg/dL (ref 0.44–1.00)
GFR calc Af Amer: >60 mL/min (ref >60)
GFR calc non Af Amer: >60 mL/min (ref >60)
Glucose, Bld: 117 mg/dL — ABNORMAL HIGH (ref 70–99)
Potassium: 3.3 mmol/L — ABNORMAL LOW (ref 3.5–5.1)
Sodium: 137 mmol/L (ref 135–145)

## 2019-07-28 SURGERY — HYSTERECTOMY, VAGINAL
Anesthesia: General | Site: Vagina | Laterality: Bilateral

## 2019-07-28 MED ORDER — ONDANSETRON HCL 4 MG/2ML IJ SOLN
INTRAMUSCULAR | Status: DC | PRN
  Administered 2019-07-28: 4 mg via INTRAVENOUS

## 2019-07-28 MED ORDER — PANTOPRAZOLE SODIUM 40 MG PO TBEC: 40.0000 mg | DELAYED_RELEASE_TABLET | Freq: Every day | ORAL | Status: DC

## 2019-07-28 MED ORDER — PROPOFOL 10 MG/ML IV BOLUS
INTRAVENOUS | Status: AC
  Filled 2019-07-28: qty 40

## 2019-07-28 MED ORDER — SODIUM CHLORIDE (PF) 0.9 % IJ SOLN
INTRAMUSCULAR | Status: DC | PRN
  Administered 2019-07-28: 100 mL

## 2019-07-28 MED ORDER — ONDANSETRON HCL 4 MG PO TABS: 4.0000 mg | ORAL_TABLET | Freq: Four times a day (QID) | ORAL | Status: DC | PRN

## 2019-07-28 MED ORDER — DEXAMETHASONE SODIUM PHOSPHATE 10 MG/ML IJ SOLN
INTRAMUSCULAR | Status: DC | PRN
  Administered 2019-07-28: 10 mg via INTRAVENOUS

## 2019-07-28 MED ORDER — MIDAZOLAM HCL 2 MG/2ML IJ SOLN: 1.0000 mg | INTRAMUSCULAR | Status: DC | PRN

## 2019-07-28 MED ORDER — ONDANSETRON HCL 4 MG/2ML IJ SOLN
INTRAMUSCULAR | Status: AC
  Filled 2019-07-28: qty 2

## 2019-07-28 MED ORDER — TRAZODONE HCL 50 MG PO TABS: 50.0000 mg | ORAL_TABLET | Freq: Every evening | ORAL | Status: DC | PRN

## 2019-07-28 MED ORDER — ACETAMINOPHEN 500 MG PO TABS
1000.0000 mg | ORAL_TABLET | Freq: Once | ORAL | Status: AC
  Administered 2019-07-28: 1000 mg via ORAL

## 2019-07-28 MED ORDER — LIDOCAINE 2% (20 MG/ML) 5 ML SYRINGE
INTRAMUSCULAR | Status: DC | PRN
  Administered 2019-07-28: 60 mg via INTRAVENOUS

## 2019-07-28 MED ORDER — HYDROMORPHONE HCL 1 MG/ML IJ SOLN
INTRAMUSCULAR | Status: AC
  Filled 2019-07-28: qty 0.5

## 2019-07-28 MED ORDER — BUPIVACAINE-EPINEPHRINE 0.5% -1:200000 IJ SOLN
INTRAMUSCULAR | Status: DC | PRN
  Administered 2019-07-28: 30 mL

## 2019-07-28 MED ORDER — OXYCODONE-ACETAMINOPHEN 5-325 MG PO TABS
1.0000 | ORAL_TABLET | ORAL | Status: DC | PRN
  Administered 2019-07-28: 1 via ORAL
  Filled 2019-07-28: qty 1

## 2019-07-28 MED ORDER — OXYCODONE-ACETAMINOPHEN 5-325 MG PO TABS: 1.0000 | ORAL_TABLET | Freq: Four times a day (QID) | ORAL | 0 refills | Status: DC | PRN

## 2019-07-28 MED ORDER — IBUPROFEN 800 MG PO TABS
800.0000 mg | ORAL_TABLET | Freq: Three times a day (TID) | ORAL | Status: DC
  Administered 2019-07-28: 800 mg via ORAL
  Filled 2019-07-28: qty 4

## 2019-07-28 MED ORDER — ROCURONIUM BROMIDE 10 MG/ML (PF) SYRINGE
PREFILLED_SYRINGE | INTRAVENOUS | Status: AC
  Filled 2019-07-28: qty 10

## 2019-07-28 MED ORDER — ROCURONIUM BROMIDE 100 MG/10ML IV SOLN
INTRAVENOUS | Status: DC | PRN
  Administered 2019-07-28: 10 mg via INTRAVENOUS
  Administered 2019-07-28: 50 mg via INTRAVENOUS

## 2019-07-28 MED ORDER — MEPERIDINE HCL 25 MG/ML IJ SOLN: 6.2500 mg | INTRAMUSCULAR | Status: DC | PRN

## 2019-07-28 MED ORDER — PHENYLEPHRINE 40 MCG/ML (10ML) SYRINGE FOR IV PUSH (FOR BLOOD PRESSURE SUPPORT)
PREFILLED_SYRINGE | INTRAVENOUS | Status: AC
  Filled 2019-07-28: qty 10

## 2019-07-28 MED ORDER — KETOROLAC TROMETHAMINE 30 MG/ML IJ SOLN
INTRAMUSCULAR | Status: AC
  Filled 2019-07-28: qty 1

## 2019-07-28 MED ORDER — OXYCODONE HCL 5 MG/5ML PO SOLN: 5.0000 mg | Freq: Once | ORAL | Status: AC | PRN

## 2019-07-28 MED ORDER — KETOROLAC TROMETHAMINE 30 MG/ML IJ SOLN
INTRAMUSCULAR | Status: DC | PRN
  Administered 2019-07-28: 30 mg via INTRAVENOUS

## 2019-07-28 MED ORDER — AMLODIPINE BESYLATE 10 MG PO TABS
10.0000 mg | ORAL_TABLET | Freq: Every day | ORAL | Status: DC
  Administered 2019-07-28: 10 mg via ORAL

## 2019-07-28 MED ORDER — SCOPOLAMINE 1 MG/3DAYS TD PT72
1.0000 | MEDICATED_PATCH | Freq: Once | TRANSDERMAL | Status: DC
  Administered 2019-07-28: 1.5 mg via TRANSDERMAL

## 2019-07-28 MED ORDER — CEFAZOLIN SODIUM-DEXTROSE 2-4 GM/100ML-% IV SOLN
INTRAVENOUS | Status: AC
  Filled 2019-07-28: qty 100

## 2019-07-28 MED ORDER — CEFAZOLIN SODIUM-DEXTROSE 2-4 GM/100ML-% IV SOLN
2.0000 g | INTRAVENOUS | Status: AC
  Administered 2019-07-28: 2 g via INTRAVENOUS

## 2019-07-28 MED ORDER — ONDANSETRON HCL 4 MG/2ML IJ SOLN: 4.0000 mg | Freq: Once | INTRAMUSCULAR | Status: DC | PRN

## 2019-07-28 MED ORDER — LIDOCAINE 2% (20 MG/ML) 5 ML SYRINGE
INTRAMUSCULAR | Status: AC
  Filled 2019-07-28: qty 5

## 2019-07-28 MED ORDER — DEXAMETHASONE SODIUM PHOSPHATE 10 MG/ML IJ SOLN
INTRAMUSCULAR | Status: AC
  Filled 2019-07-28: qty 1

## 2019-07-28 MED ORDER — FENTANYL CITRATE (PF) 100 MCG/2ML IJ SOLN: 50.0000 ug | INTRAMUSCULAR | Status: DC | PRN

## 2019-07-28 MED ORDER — ACETAMINOPHEN 500 MG PO TABS
ORAL_TABLET | ORAL | Status: AC
  Filled 2019-07-28: qty 2

## 2019-07-28 MED ORDER — LACTATED RINGERS IV SOLN: INTRAVENOUS | Status: DC

## 2019-07-28 MED ORDER — ONDANSETRON HCL 4 MG/2ML IJ SOLN: 4.0000 mg | Freq: Four times a day (QID) | INTRAMUSCULAR | Status: DC | PRN

## 2019-07-28 MED ORDER — MIDAZOLAM HCL 2 MG/2ML IJ SOLN
INTRAMUSCULAR | Status: AC
  Filled 2019-07-28: qty 2

## 2019-07-28 MED ORDER — HYDROMORPHONE HCL 1 MG/ML IJ SOLN
0.2500 mg | INTRAMUSCULAR | Status: DC | PRN
  Administered 2019-07-28: 0.5 mg via INTRAVENOUS

## 2019-07-28 MED ORDER — PHENYLEPHRINE 40 MCG/ML (10ML) SYRINGE FOR IV PUSH (FOR BLOOD PRESSURE SUPPORT)
PREFILLED_SYRINGE | INTRAVENOUS | Status: DC | PRN
  Administered 2019-07-28 (×2): 80 ug via INTRAVENOUS
  Administered 2019-07-28: 40 ug via INTRAVENOUS

## 2019-07-28 MED ORDER — SUGAMMADEX SODIUM 200 MG/2ML IV SOLN
INTRAVENOUS | Status: DC | PRN
  Administered 2019-07-28: 200 mg via INTRAVENOUS

## 2019-07-28 MED ORDER — LACTATED RINGERS IV SOLN
INTRAVENOUS | Status: DC
  Administered 2019-07-28 (×2): via INTRAVENOUS

## 2019-07-28 MED ORDER — METOPROLOL SUCCINATE 25 MG PO CS24
25.0000 mg | EXTENDED_RELEASE_CAPSULE | Freq: Every day | ORAL | Status: DC
  Administered 2019-07-28: 25 mg via ORAL

## 2019-07-28 MED ORDER — FENTANYL CITRATE (PF) 100 MCG/2ML IJ SOLN
INTRAMUSCULAR | Status: AC
  Filled 2019-07-28: qty 4

## 2019-07-28 MED ORDER — FENTANYL CITRATE (PF) 100 MCG/2ML IJ SOLN
INTRAMUSCULAR | Status: DC | PRN
  Administered 2019-07-28: 25 ug via INTRAVENOUS
  Administered 2019-07-28 (×3): 50 ug via INTRAVENOUS
  Administered 2019-07-28: 25 ug via INTRAVENOUS

## 2019-07-28 MED ORDER — KETOROLAC TROMETHAMINE 30 MG/ML IJ SOLN: 30.0000 mg | Freq: Once | INTRAMUSCULAR | Status: DC | PRN

## 2019-07-28 MED ORDER — IBUPROFEN 600 MG PO TABS: 600.0000 mg | ORAL_TABLET | Freq: Four times a day (QID) | ORAL | 1 refills | Status: DC | PRN

## 2019-07-28 MED ORDER — OXYCODONE HCL 5 MG PO TABS
5.0000 mg | ORAL_TABLET | Freq: Once | ORAL | Status: AC | PRN
  Administered 2019-07-28: 5 mg via ORAL
  Filled 2019-07-28: qty 1

## 2019-07-28 MED ORDER — MIDAZOLAM HCL 5 MG/5ML IJ SOLN
INTRAMUSCULAR | Status: DC | PRN
  Administered 2019-07-28: 2 mg via INTRAVENOUS

## 2019-07-28 MED ORDER — PROPOFOL 10 MG/ML IV BOLUS
INTRAVENOUS | Status: DC | PRN
  Administered 2019-07-28: 150 mg via INTRAVENOUS

## 2019-07-28 MED ORDER — ESTROGENS CONJUGATED 0.625 MG PO TABS: 0.6250 mg | ORAL_TABLET | Freq: Every day | ORAL | 5 refills | Status: DC

## 2019-07-28 MED ORDER — HYDROCHLOROTHIAZIDE 25 MG PO TABS: 25.0000 mg | ORAL_TABLET | Freq: Every day | ORAL | Status: DC

## 2019-07-28 MED ORDER — SCOPOLAMINE 1 MG/3DAYS TD PT72
MEDICATED_PATCH | TRANSDERMAL | Status: AC
  Filled 2019-07-28: qty 1

## 2019-07-28 MED FILL — Metoprolol Succinate Tab ER 24HR 25 MG (Tartrate Equiv): ORAL | Qty: 1 | Status: AC

## 2019-07-28 SURGICAL SUPPLY — 24 items
CANISTER SUCT 1200ML W/VALVE (MISCELLANEOUS) ×2 IMPLANT
DECANTER SPIKE VIAL GLASS SM (MISCELLANEOUS) ×2 IMPLANT
GAUZE 4X4 16PLY RFD (DISPOSABLE) ×2 IMPLANT
GLOVE BIO SURGEON STRL SZ 6.5 (GLOVE) ×2 IMPLANT
GLOVE BIOGEL PI IND STRL 6.5 (GLOVE) ×1 IMPLANT
GLOVE BIOGEL PI IND STRL 7.0 (GLOVE) ×1 IMPLANT
GLOVE BIOGEL PI INDICATOR 6.5 (GLOVE) ×1
GLOVE BIOGEL PI INDICATOR 7.0 (GLOVE) ×1
GOWN STRL REUS W/ TWL LRG LVL3 (GOWN DISPOSABLE) ×4 IMPLANT
GOWN STRL REUS W/TWL LRG LVL3 (GOWN DISPOSABLE) ×8
HIBICLENS CHG 4% 4OZ BTL (MISCELLANEOUS) ×2 IMPLANT
NDL MAYO CATGUT SZ4 TPR NDL (NEEDLE) ×1 IMPLANT
NDL SPNL 18GX3.5 QUINCKE PK (NEEDLE) ×1 IMPLANT
NEEDLE MAYO CATGUT SZ4 (NEEDLE) ×2 IMPLANT
NEEDLE SPNL 18GX3.5 QUINCKE PK (NEEDLE) ×2 IMPLANT
NS IRRIG 1000ML POUR BTL (IV SOLUTION) ×2 IMPLANT
PACK VAGINAL WOMENS (CUSTOM PROCEDURE TRAY) ×2 IMPLANT
PAD OB MATERNITY 4.3X12.25 (PERSONAL CARE ITEMS) ×2 IMPLANT
SLEEVE SCD COMPRESS KNEE MED (MISCELLANEOUS) ×4 IMPLANT
SUT VIC AB 2-0 CT1 18 (SUTURE) ×4 IMPLANT
SUT VIC AB 2-0 CT1 27 (SUTURE) ×4
SUT VIC AB 2-0 CT1 TAPERPNT 27 (SUTURE) ×2 IMPLANT
TOWEL GREEN STERILE FF (TOWEL DISPOSABLE) ×4 IMPLANT
TRAY FOLEY W/BAG SLVR 14FR LF (SET/KITS/TRAYS/PACK) ×2 IMPLANT

## 2019-07-28 NOTE — Op Note (Addendum)
07/28/2019  9:11 AM  PATIENT:  Becky Koch  48 y.o. female  PRE-OPERATIVE DIAGNOSIS:  Fibroids Menorrhagia Pelvic Pain Dyspareunia  POST-OPERATIVE DIAGNOSIS:  fibroids, menorrhagia, dypareunia, pelvic pain  PROCEDURE:  TRANSVAGINAL HYSTERECTOMY, BILATERAL SALPINGOOPHERECTOM SURGEON:  Surgeon(s) and Role:    * Marshella Tello, Wilhemina Cash, MD - Primary    * Donnamae Jude, MD - Assisting  ASSISTANTS: Acquanetta Sit, MS3   ANESTHESIA:   local and general  EBL:  50 mL   BLOOD ADMINISTERED:none  DRAINS: Urinary Catheter (Foley)   LOCAL MEDICATIONS USED:  MARCAINE     SPECIMEN:  Source of Specimen:  uterus, tubes, and ovaries  DISPOSITION OF SPECIMEN:  PATHOLOGY  COUNTS:  YES  TOURNIQUET:  * No tourniquets in log *  DICTATION: .Dragon Dictation  PLAN OF CARE: Admit for overnight observation  PATIENT DISPOSITION:  PACU - hemodynamically stable.   Delay start of Pharmacological VTE agent (>24hrs) due to surgical blood loss or risk of bleeding: not applicable  The risks, benefits, alternatives of surgery were explained, understood, and accepted. All questions were answered and a consent form was signed. She was taken to the operating room and general anesthesia was applied. She was put in the dorsal lithotomy position. Her vagina was prepped and draped in the usual sterile fashion. A timeout procedure was done. A bimanual exam revealed a globular mobile uterus and non-enlarged adnexa. A bladder catheter was placed and it drained clear urine throughout the case. The cervix was grasped with a single-tooth tenaculum. A total of 90 cc of dilute Marcaine was injected in a circumferential fashion at the cervicovaginal junction. An incision was made at the site. The posterior peritoneum was entered. A long weighted speculum was placed. The anterior peritoneum was entered. A Deaver was placed anteriorly. The uterosacral ligaments were clamped, cut, and ligated. They were tagged and held. 2 Vicryl  sutures used throughout this case unless otherwise specified. The uterus was separated from its pelvic attachments using a similar clamp, cut, ligate technique. Excellent hemostasis was maintained throughout. Once the uterus was removed, the bowel was kept out of the operative site with a sponge on a stick. I was able to see both ovaries and they appeared normal and atrophic.  I grasped the ovary and clamped the infundibuloligamine with a Kelley clamp. This was ligated and sutured with 2-0 vicryl suture. I then grasped the oviduct and excised it. I repeated this procedure on the contralateral side.  Excellent hemostasis was noted. I used the tags on the  uterosacral ligments to incorportate these into the vaginal angles for vaginal support.  I closed the edges of the vaginal mucosa in a running, locking fashion, closing the entire vaginal cuff in a vertical fashion. She was extubated and taken to the recovery room in stable condition. An experienced assistant was required given the standard of surgical care given the complexity of the case.  This assistant was needed for exposure, dissection, suctioning, retraction, instrument exchange, assisting with delivery with administration of fundal pressure, and for overall help during the procedure.  ----- Message -----

## 2019-07-28 NOTE — Anesthesia Procedure Notes (Signed)
Procedure Name: Intubation Date/Time: 07/28/2019 7:35 AM Performed by: Gwyndolyn Saxon, CRNA Pre-anesthesia Checklist: Patient identified, Emergency Drugs available, Suction available and Patient being monitored Patient Re-evaluated:Patient Re-evaluated prior to induction Oxygen Delivery Method: Circle system utilized Preoxygenation: Pre-oxygenation with 100% oxygen Induction Type: IV induction Ventilation: Mask ventilation without difficulty Laryngoscope Size: Miller and 2 Grade View: Grade I Tube type: Oral Tube size: 7.0 mm Number of attempts: 1 Airway Equipment and Method: Patient positioned with wedge pillow and Stylet Placement Confirmation: ETT inserted through vocal cords under direct vision,  positive ETCO2 and breath sounds checked- equal and bilateral Secured at: 20 cm Tube secured with: Tape Dental Injury: Teeth and Oropharynx as per pre-operative assessment

## 2019-07-28 NOTE — H&P (Signed)
Becky Koch is an 48 yomarriedP4 (8 grands) here for  a hysterectomy due to pelvic pain, heavy periods, and dyspareunia. She would like her ovaries and tubes removed as well. She declined medical management. She has been having hot flashes for about 4 years but worse in the last year, worse at night. She doesn't use vaginal lubricant with sex. She is aware that she will be post menopausal and she tells me that she does want to start ERT. EMBX, pap smear, and mammogram were normal this year.  Ultrasound showed uterine enlargement with fibroids.  She has had a BTL.      Menstrual History: Menarche: 48 years old    Past Medical History:  Diagnosis Date  . Allergy   . Anemia   . Blood transfusion without reported diagnosis   . Fibroid, uterine   . GERD (gastroesophageal reflux disease)   . Hypertension     Past Surgical History:  Procedure Laterality Date  . TUBAL LIGATION      Family History  Problem Relation Age of Onset  . Cancer Paternal Grandfather   . Colon cancer Neg Hx   . Colon polyps Neg Hx   . Esophageal cancer Neg Hx   . Rectal cancer Neg Hx   . Stomach cancer Neg Hx     Social History:  reports that she quit smoking about 4 months ago. Her smoking use included cigarettes. She smoked 0.50 packs per day. She has never used smokeless tobacco. She reports current alcohol use. She reports that she does not use drugs.  Allergies: No Known Allergies  Medications Prior to Admission  Medication Sig Dispense Refill Last Dose  . amLODipine (NORVASC) 10 MG tablet TAKE 1 TABLET (10 MG TOTAL) BY MOUTH DAILY. 30 tablet 2 07/28/2019 at Unknown time  . hydrochlorothiazide (HYDRODIURIL) 25 MG tablet TAKE 1 TABLET(25 MG) BY MOUTH DAILY 30 tablet 2 07/28/2019 at Unknown time  . hydrocortisone 2.5 % ointment Apply topically 2 (two) times daily. (Patient taking differently: Apply 1 application topically 2 (two) times daily. ) 30 g 1 07/27/2019 at Unknown time  . ibuprofen  (ADVIL,MOTRIN) 200 MG tablet Take 800 mg by mouth every 8 (eight) hours as needed for headache or moderate pain.    Past Month at Unknown time  . megestrol (MEGACE) 40 MG tablet TAKE 1 TABLET(40 MG) BY MOUTH TWICE DAILY (Patient taking differently: Take 40 mg by mouth 2 (two) times daily. ) 60 tablet 5 Past Week at Unknown time  . Metoprolol Succinate 25 MG CS24 Take 25 mg by mouth daily. 30 capsule 11 07/28/2019 at 0400  . naproxen (NAPROSYN) 500 MG tablet Take 1 tablet (500 mg total) by mouth 2 (two) times daily as needed. 60 tablet 6 Past Week at Unknown time  . omeprazole (PRILOSEC) 20 MG capsule TAKE 1 CAPSULE BY MOUTH DAILY. 30 capsule 3 07/27/2019 at Unknown time  . sulfamethoxazole-trimethoprim (BACTRIM DS) 800-160 MG tablet Take 1 tablet by mouth 2 (two) times daily. 6 tablet 0 07/27/2019 at Unknown time  . traZODone (DESYREL) 50 MG tablet Take 0.5-1 tablets (25-50 mg total) by mouth at bedtime as needed for sleep. (Patient taking differently: Take 50 mg by mouth at bedtime as needed for sleep. ) 30 tablet 3 Past Week at Unknown time  . cetirizine (ZYRTEC) 10 MG tablet Take 1 tablet (10 mg total) by mouth daily. 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 She has been married.  Blood pressure (!) 142/98, pulse (!) 102, temperature 98 F (36.7 C), temperature source Oral, resp. rate 18, height 5\' 6"  (1.676 m), weight 74.8 kg, last menstrual period 07/24/2019, SpO2 100 %. Physical Exam Breathing, conversing, and ambulating normally Well nourished, well hydrated Black female, no apparent distress Heart- rrr Lungs- CTAB Abd- benign  Results for orders placed or performed during the hospital encounter of 07/28/19 (from the past 24 hour(s))  Type and screen [facility]     Status: None   Collection Time: 07/27/19  1:25 PM  Result Value Ref Range   ABO/RH(D) A POS    Antibody Screen NEG    Sample  Expiration      07/30/2019,2359 Performed at [facility], [place] [address], [place], [place] [address]     No results found.  Assessment/Plan: Symptomatic fibroids- plan for TVH/BSO  She understands the risks of surgery, including, but not to infection, bleeding, DVTs, damage to bowel, bladder, ureters. She wishes to proceed.     Emily Filbert 07/28/2019, 7:13 AM

## 2019-07-28 NOTE — Transfer of Care (Signed)
Immediate Anesthesia Transfer of Care Note  Patient: Becky Koch  Procedure(s) Performed: HYSTERECTOMY VAGINAL WITH BILATERAL SALPINGECTOMY (Bilateral Vagina )  Patient Location: PACU  Anesthesia Type:General  Level of Consciousness: drowsy and patient cooperative  Airway & Oxygen Therapy: Patient Spontanous Breathing and Patient connected to face mask oxygen  Post-op Assessment: Report given to RN and Post -op Vital signs reviewed and stable  Post vital signs: Reviewed and stable  Last Vitals:  Vitals Value Taken Time  BP 106/74 07/28/19 0851  Temp    Pulse 91 07/28/19 0855  Resp 17 07/28/19 0855  SpO2 100 % 07/28/19 0855  Vitals shown include unvalidated device data.  Last Pain:  Vitals:   07/28/19 0635  TempSrc: Oral  PainSc: 0-No pain      Patients Stated Pain Goal: 4 ([id number])  Complications: No apparent anesthesia complications

## 2019-07-28 NOTE — Anesthesia Postprocedure Evaluation (Signed)
Anesthesia Post Note  Patient: Becky Koch  Procedure(s) Performed: HYSTERECTOMY VAGINAL WITH BILATERAL SALPINGECTOMY (Bilateral Vagina )     Patient location during evaluation: PACU Anesthesia Type: General Level of consciousness: awake and alert Pain management: pain level controlled Vital Signs Assessment: post-procedure vital signs reviewed and stable Respiratory status: spontaneous breathing, nonlabored ventilation, respiratory function stable and patient connected to nasal cannula oxygen Cardiovascular status: blood pressure returned to baseline and stable Postop Assessment: no apparent nausea or vomiting Anesthetic complications: no    Last Vitals:  Vitals:   07/28/19 1000 07/28/19 1030  BP: 121/89   Pulse: 81   Resp: 16   Temp: (!) 36.4 C   SpO2: 100% 100%    Last Pain:  Vitals:   07/28/19 1030  TempSrc:   PainSc: 4                  Barnet Glasgow

## 2019-07-28 NOTE — Progress Notes (Signed)
Patient ambulated in hallway for 10 minutes. Foley discontinued and patient voided 200 cc clear urine. Scant blood on pad. Pain is a "2" and tolerable. Patient states that she would like to go home if possible. Will notify MD. Sherilyn Dacosta

## 2019-07-28 NOTE — Discharge Instructions (Signed)
Vaginal Hysterectomy, Care After Refer to this sheet in the next few weeks. These instructions provide you with information about caring for yourself after your procedure. Your health care provider may also give you more specific instructions. Your treatment has been planned according to current medical practices, but problems sometimes occur. Call your health care provider if you have any problems or questions after your procedure. What can I expect after the procedure? After the procedure, it is common to have:  Pain.  Soreness and numbness in your incision areas.  Vaginal bleeding and discharge.  Constipation.  Temporary problems emptying the bladder.  Feelings of sadness or other emotions. Follow these instructions at home: Medicines  Take over-the-counter and prescription medicines only as told by your health care provider.  If you were prescribed an antibiotic medicine, take it as told by your health care provider. Do not stop taking the antibiotic even if you start to feel better.  Do not drive or operate heavy machinery while taking prescription pain medicine. Activity  Return to your normal activities as told by your health care provider. Ask your health care provider what activities are safe for you.  Get regular exercise as told by your health care provider. You may be told to take short walks every day and go farther each time.  Do not lift anything that is heavier than 10 lb (4.5 kg). General instructions   Do not put anything in your vagina for 6 weeks after your surgery or as told by your health care provider. This includes tampons and douches.  Do not have sex until your health care provider says you can.  Do not take baths, swim, or use a hot tub until your health care provider approves.  Drink enough fluid to keep your urine clear or pale yellow.  Do not drive for 24 hours if you were given a sedative.  Keep all follow-up visits as told by your health  care provider. This is important. Contact a health care provider if:  Your pain medicine is not helping.  You have a fever.  You have redness, swelling, or pain at your incision site.  You have blood, pus, or a bad-smelling discharge from your vagina.  You continue to have difficulty urinating. Get help right away if:  You have severe abdominal or back pain.  You have heavy bleeding from your vagina.  You have chest pain or shortness of breath. This information is not intended to replace advice given to you by your health care provider. Make sure you discuss any questions you have with your health care provider. Document Released: 11/28/2015 Document Revised: 03/29/2016 Document Reviewed: 08/21/2015 Elsevier Patient Education  2020 [employer].

## 2019-07-29 ENCOUNTER — Encounter (HOSPITAL_BASED_OUTPATIENT_CLINIC_OR_DEPARTMENT_OTHER): Payer: Self-pay | Admitting: Obstetrics & Gynecology

## 2019-07-29 LAB — SURGICAL PATHOLOGY

## 2019-08-02 ENCOUNTER — Encounter (HOSPITAL_COMMUNITY): Payer: Self-pay | Admitting: *Deleted

## 2019-08-02 ENCOUNTER — Inpatient Hospital Stay (HOSPITAL_COMMUNITY): Payer: Self-pay

## 2019-08-02 ENCOUNTER — Inpatient Hospital Stay (HOSPITAL_COMMUNITY)
Admission: EM | Admit: 2019-08-02 | Discharge: 2019-08-07 | DRG: 337 | Disposition: A | Discharge status: Home / Self Care | Payer: Self-pay | Attending: Physician Assistant | Admitting: Physician Assistant

## 2019-08-02 ENCOUNTER — Emergency Department (HOSPITAL_COMMUNITY): Payer: Self-pay

## 2019-08-02 ENCOUNTER — Other Ambulatory Visit: Payer: Self-pay

## 2019-08-02 DIAGNOSIS — Z90710 Acquired absence of both cervix and uterus: Secondary | ICD-10-CM

## 2019-08-02 DIAGNOSIS — Z79899 Other long term (current) drug therapy: Secondary | ICD-10-CM

## 2019-08-02 DIAGNOSIS — Z0189 Encounter for other specified special examinations: Secondary | ICD-10-CM

## 2019-08-02 DIAGNOSIS — Z9079 Acquired absence of other genital organ(s): Secondary | ICD-10-CM

## 2019-08-02 DIAGNOSIS — Z87891 Personal history of nicotine dependence: Secondary | ICD-10-CM

## 2019-08-02 DIAGNOSIS — Z90722 Acquired absence of ovaries, bilateral: Secondary | ICD-10-CM

## 2019-08-02 DIAGNOSIS — Z9851 Tubal ligation status: Secondary | ICD-10-CM

## 2019-08-02 DIAGNOSIS — Z79891 Long term (current) use of opiate analgesic: Secondary | ICD-10-CM

## 2019-08-02 DIAGNOSIS — I10 Essential (primary) hypertension: Secondary | ICD-10-CM | POA: Diagnosis present

## 2019-08-02 DIAGNOSIS — Z20828 Contact with and (suspected) exposure to other viral communicable diseases: Secondary | ICD-10-CM | POA: Diagnosis present

## 2019-08-02 DIAGNOSIS — Z79890 Hormone replacement therapy (postmenopausal): Secondary | ICD-10-CM

## 2019-08-02 DIAGNOSIS — K5651 Intestinal adhesions [bands], with partial obstruction: Principal | ICD-10-CM | POA: Diagnosis present

## 2019-08-02 DIAGNOSIS — K219 Gastro-esophageal reflux disease without esophagitis: Secondary | ICD-10-CM | POA: Diagnosis present

## 2019-08-02 DIAGNOSIS — K56609 Unspecified intestinal obstruction, unspecified as to partial versus complete obstruction: Secondary | ICD-10-CM | POA: Diagnosis present

## 2019-08-02 DIAGNOSIS — G4700 Insomnia, unspecified: Secondary | ICD-10-CM | POA: Diagnosis not present

## 2019-08-02 LAB — CBC WITH DIFFERENTIAL/PLATELET
Abs Immature Granulocytes: 0.04 10*3/uL (ref 0.00–0.07)
Basophils Absolute: 0 10*3/uL (ref 0.0–0.1)
Basophils Relative: 0 %
Eosinophils Absolute: 0.1 10*3/uL (ref 0.0–0.5)
Eosinophils Relative: 1 %
HCT: 43.1 % (ref 36.0–46.0)
Hemoglobin: 14.1 g/dL (ref 12.0–15.0)
Immature Granulocytes: 0 %
Lymphocytes Relative: 13 %
Lymphs Abs: 1.1 10*3/uL (ref 0.7–4.0)
MCH: 29.4 pg (ref 26.0–34.0)
MCHC: 32.7 g/dL (ref 30.0–36.0)
MCV: 89.8 fL (ref 80.0–100.0)
Monocytes Absolute: 0.7 10*3/uL (ref 0.1–1.0)
Monocytes Relative: 8 %
Neutro Abs: 7 10*3/uL (ref 1.7–7.7)
Neutrophils Relative %: 78 %
Platelets: 391 10*3/uL (ref 150–400)
RBC: 4.8 MIL/uL (ref 3.87–5.11)
RDW: 12.5 % (ref 11.5–15.5)
WBC: 9 10*3/uL (ref 4.0–10.5)
nRBC: 0 % (ref 0.0–0.2)

## 2019-08-02 LAB — COMPREHENSIVE METABOLIC PANEL
ALT: 36 U/L (ref 0–44)
AST: 38 U/L (ref 15–41)
Albumin: 3.7 g/dL (ref 3.5–5.0)
Alkaline Phosphatase: 75 U/L (ref 38–126)
Anion gap: 12 (ref 5–15)
BUN: 9 mg/dL (ref 6–20)
CO2: 27 mmol/L (ref 22–32)
Calcium: 10.4 mg/dL — ABNORMAL HIGH (ref 8.9–10.3)
Chloride: 102 mmol/L (ref 98–111)
Creatinine, Ser: 0.9 mg/dL (ref 0.44–1.00)
GFR calc Af Amer: >60 mL/min (ref >60)
GFR calc non Af Amer: >60 mL/min (ref >60)
Glucose, Bld: 148 mg/dL — ABNORMAL HIGH (ref 70–99)
Potassium: 3.9 mmol/L (ref 3.5–5.1)
Sodium: 141 mmol/L (ref 135–145)
Total Bilirubin: 0.4 mg/dL (ref 0.3–1.2)
Total Protein: 7.7 g/dL (ref 6.5–8.1)

## 2019-08-02 LAB — URINALYSIS, ROUTINE W REFLEX MICROSCOPIC
Bacteria, UA: NONE SEEN
Bilirubin Urine: NEGATIVE
Glucose, UA: NEGATIVE mg/dL
Hgb urine dipstick: NEGATIVE
Ketones, ur: NEGATIVE mg/dL
Leukocytes,Ua: NEGATIVE
Nitrite: NEGATIVE
Protein, ur: 30 mg/dL — AB
Specific Gravity, Urine: 1.026 (ref 1.005–1.030)
pH: 7 (ref 5.0–8.0)

## 2019-08-02 LAB — LIPASE, BLOOD: Lipase: 25 U/L (ref 11–51)

## 2019-08-02 MED ORDER — ONDANSETRON HCL 4 MG/2ML IJ SOLN
4.0000 mg | Freq: Four times a day (QID) | INTRAMUSCULAR | Status: DC | PRN
  Administered 2019-08-02 – 2019-08-03 (×4): 4 mg via INTRAVENOUS
  Filled 2019-08-02 (×4): qty 2

## 2019-08-02 MED ORDER — ONDANSETRON HCL 4 MG/2ML IJ SOLN
4.0000 mg | Freq: Once | INTRAMUSCULAR | Status: AC
  Administered 2019-08-02: 11:00:00 4 mg via INTRAVENOUS
  Filled 2019-08-02: qty 2

## 2019-08-02 MED ORDER — MORPHINE SULFATE (PF) 2 MG/ML IV SOLN
2.0000 mg | INTRAVENOUS | Status: DC | PRN
  Administered 2019-08-02 – 2019-08-03 (×9): 2 mg via INTRAVENOUS
  Administered 2019-08-03: 3 mg via INTRAVENOUS
  Filled 2019-08-02 (×2): qty 2
  Filled 2019-08-02: qty 1
  Filled 2019-08-02 (×2): qty 2
  Filled 2019-08-02 (×2): qty 1
  Filled 2019-08-02: qty 2
  Filled 2019-08-02: qty 1
  Filled 2019-08-02 (×2): qty 2

## 2019-08-02 MED ORDER — IOHEXOL 300 MG/ML  SOLN
100.0000 mL | Freq: Once | INTRAMUSCULAR | Status: AC | PRN
  Administered 2019-08-02: 100 mL via INTRAVENOUS

## 2019-08-02 MED ORDER — ONDANSETRON 4 MG PO TBDP: 4.0000 mg | ORAL_TABLET | Freq: Four times a day (QID) | ORAL | Status: DC | PRN

## 2019-08-02 MED ORDER — ENOXAPARIN SODIUM 40 MG/0.4ML ~~LOC~~ SOLN
40.0000 mg | SUBCUTANEOUS | Status: DC
  Administered 2019-08-04 – 2019-08-05 (×2): 40 mg via SUBCUTANEOUS
  Filled 2019-08-02 (×4): qty 0.4

## 2019-08-02 MED ORDER — HYDRALAZINE HCL 20 MG/ML IJ SOLN: 10.0000 mg | INTRAMUSCULAR | Status: DC | PRN

## 2019-08-02 MED ORDER — SODIUM CHLORIDE 0.9 % IV BOLUS
1000.0000 mL | Freq: Once | INTRAVENOUS | Status: AC
  Administered 2019-08-02: 1000 mL via INTRAVENOUS

## 2019-08-02 MED ORDER — DIPHENHYDRAMINE HCL 50 MG/ML IJ SOLN: 25.0000 mg | Freq: Four times a day (QID) | INTRAMUSCULAR | Status: DC | PRN

## 2019-08-02 MED ORDER — ONDANSETRON HCL 4 MG/2ML IJ SOLN
4.0000 mg | Freq: Once | INTRAMUSCULAR | Status: AC
  Administered 2019-08-02: 08:00:00 4 mg via INTRAVENOUS
  Filled 2019-08-02: qty 2

## 2019-08-02 MED ORDER — SODIUM CHLORIDE 0.9 % IV SOLN
INTRAVENOUS | Status: DC
  Administered 2019-08-02: 12:00:00 via INTRAVENOUS

## 2019-08-02 MED ORDER — POTASSIUM CHLORIDE IN NACL 20-0.9 MEQ/L-% IV SOLN
INTRAVENOUS | Status: DC
  Administered 2019-08-02 – 2019-08-03 (×4): via INTRAVENOUS
  Filled 2019-08-02 (×5): qty 1000

## 2019-08-02 MED ORDER — HYDROMORPHONE HCL 1 MG/ML IJ SOLN
1.0000 mg | Freq: Once | INTRAMUSCULAR | Status: AC
  Administered 2019-08-02: 1 mg via INTRAVENOUS
  Filled 2019-08-02: qty 1

## 2019-08-02 MED ORDER — DIPHENHYDRAMINE HCL 25 MG PO CAPS: 25.0000 mg | ORAL_CAPSULE | Freq: Four times a day (QID) | ORAL | Status: DC | PRN

## 2019-08-02 MED ORDER — DIATRIZOATE MEGLUMINE & SODIUM 66-10 % PO SOLN
90.0000 mL | Freq: Once | ORAL | Status: AC
  Administered 2019-08-02: 90 mL via NASOGASTRIC
  Filled 2019-08-02: qty 90

## 2019-08-02 MED ORDER — HYDROMORPHONE HCL 1 MG/ML IJ SOLN
1.0000 mg | Freq: Once | INTRAMUSCULAR | Status: AC
  Administered 2019-08-02: 11:00:00 1 mg via INTRAVENOUS
  Filled 2019-08-02: qty 1

## 2019-08-02 NOTE — ED Triage Notes (Signed)
Patient presents to ed via [facility] with c/o abd. Pain states she had a Hysterectomy  On tues c/o gas like pain at night worse tonight states she has been using over the counter stool softeners and gas med without relief.

## 2019-08-02 NOTE — Consult Note (Signed)
OB/GYN Consult Note  Referring Provider: Sherwood Gambler, MD  [name] is a 48 y.o. [phone] presenting for abdominal pain, nausea and vomiting. Gas pains started in 07/28/19 and have been getting progressively worse, she has been trying to manage at home with PO pain meds. Had small bowel movement Friday night and one Saturday night. Had significant pain starting Saturday as well, she tried to manage but it became too painful so she came in. Now feeling better as long as she is getting pain meds. Has not had bowel movement or passed gas since yesterday. Denies vaginal bleeding. CT in ED concerning for distal SBO.  S/p TVH, BSO on 07/28/19 by Dr. Hulan Fray.       Past Medical History:  Diagnosis Date  . Allergy   . Anemia   . Blood transfusion without reported diagnosis   . Fibroid, uterine   . GERD (gastroesophageal reflux disease)   . Hypertension     Past Surgical History:  Procedure Laterality Date  . TUBAL LIGATION    . VAGINAL HYSTERECTOMY Bilateral 07/28/2019   Procedure: HYSTERECTOMY VAGINAL WITH BILATERAL SALPINGECTOMY;  Surgeon: Emily Filbert, MD;  Location: [facility];  Service: Gynecology;  Laterality: Bilateral;    OB History  Gravida Para Term Preterm AB Living  8 4 4   4 4   SAB TAB Ectopic Multiple Live Births  1 3          # Outcome Date GA Lbr Len/2nd Weight Sex Delivery Anes PTL Lv  8 TAB           7 TAB           6 TAB           5 SAB           4 Term           3 Term           2 Term           1 Term             Social History   Socioeconomic History  . Marital status: Legally Separated    Spouse name: Not on file  . Number of children: Not on file  . Years of education: Not on file  . Highest education level: Not on file  Occupational History  . Not on file  Tobacco Use  . Smoking status: Former Smoker    Packs/day: 0.50    Types: Cigarettes    Quit date: 03/21/2019    Years since quitting: 0.3  . Smokeless tobacco: Never  Used  Substance and Sexual Activity  . Alcohol use: Yes    Comment: social alcohol  . Drug use: No  . Sexual activity: Yes    Birth control/protection: Surgical  Other Topics Concern  . Not on file  Social History Narrative  . Not on file   Social Determinants of Health   Financial Resource Strain:   . Difficulty of Paying Living Expenses: Not on file  Food Insecurity:   . Worried About Charity fundraiser in the Last Year: Not on file  . Ran Out of Food in the Last Year: Not on file  Transportation Needs:   . Lack of Transportation (Medical): Not on file  . Lack of Transportation (Non-Medical): Not on file  Physical Activity:   . Days of Exercise per Week: Not on file  . Minutes of Exercise per Session: Not on  file  Stress:   . Feeling of Stress : Not on file  Social Connections:   . Frequency of Communication with Friends and Family: Not on file  . Frequency of Social Gatherings with Friends and Family: Not on file  . Attends Religious Services: Not on file  . Active Member of Clubs or Organizations: Not on file  . Attends Archivist Meetings: Not on file  . Marital Status: Not on file    Family History  Problem Relation Age of Onset  . Cancer Paternal Grandfather   . Colon cancer Neg Hx   . Colon polyps Neg Hx   . Esophageal cancer Neg Hx   . Rectal cancer Neg Hx   . Stomach cancer Neg Hx    (Not in a hospital admission)  No Known Allergies  Review of Systems: Negative except for what is mentioned in HPI.     Physical Exam: BP (!) 139/96 (BP Location: Right Arm)   Pulse 91   Temp 98.5 F (36.9 C) (Oral)   Resp 18   Ht 5' 5.5" (1.664 m)   Wt 74.4 kg   LMP 07/24/2019   SpO2 100%   BMI 26.88 kg/m  CONSTITUTIONAL: Well-developed, well-nourished female in no acute distress.  HENT:  Normocephalic, atraumatic, External right and left ear normal. Oropharynx is clear and moist EYES: Conjunctivae and EOM are normal. Pupils are equal, round, and  reactive to light. No scleral icterus.  NECK: Normal range of motion, supple, no masses SKIN: Skin is warm and dry. No rash noted. Not diaphoretic. No erythema. No pallor. [place]: Alert and oriented to person, place, and time. Normal reflexes, muscle tone coordination. No cranial nerve deficit noted. PSYCHIATRIC: Normal mood and affect. Normal behavior. Normal judgment and thought content. CARDIOVASCULAR: Normal heart rate noted RESPIRATORY: Effort normal, no problems with respiration noted ABDOMEN: Soft, moderately tender diffusely, distended PELVIC: deferred MUSCULOSKELETAL: No edema and no tenderness. 2+ distal pulses.   Pertinent Labs/Studies:   Results for orders placed or performed during the hospital encounter of 08/02/19 (from the past 72 hour(s))  CBC with Differential     Status: None   Collection Time: 08/02/19  6:26 AM  Result Value Ref Range   WBC 9.0 4.0 - 10.5 K/uL   RBC 4.80 3.87 - 5.11 MIL/uL   Hemoglobin 14.1 12.0 - 15.0 g/dL   HCT 43.1 36.0 - 46.0 %   MCV 89.8 80.0 - 100.0 fL   MCH 29.4 26.0 - 34.0 pg   MCHC 32.7 30.0 - 36.0 g/dL   RDW 12.5 11.5 - 15.5 %   Platelets 391 150 - 400 K/uL   nRBC 0.0 0.0 - 0.2 %   Neutrophils Relative % 78 %   Neutro Abs 7.0 1.7 - 7.7 K/uL   Lymphocytes Relative 13 %   Lymphs Abs 1.1 0.7 - 4.0 K/uL   Monocytes Relative 8 %   Monocytes Absolute 0.7 0.1 - 1.0 K/uL   Eosinophils Relative 1 %   Eosinophils Absolute 0.1 0.0 - 0.5 K/uL   Basophils Relative 0 %   Basophils Absolute 0.0 0.0 - 0.1 K/uL   Immature Granulocytes 0 %   Abs Immature Granulocytes 0.04 0.00 - 0.07 K/uL    Comment: Performed at [facility], [address] [place], [place] [address]  Comprehensive metabolic panel     Status: Abnormal   Collection Time: 08/02/19  6:26 AM  Result Value Ref Range   Sodium 141 135 - 145  mmol/L   Potassium 3.9 3.5 - 5.1 mmol/L   Chloride 102 98 - 111 mmol/L   CO2 27 22 - 32 mmol/L   Glucose, Bld 148 (H) 70 - 99 mg/dL     BUN 9 6 - 20 mg/dL   Creatinine, Ser 0.90 0.44 - 1.00 mg/dL   Calcium 10.4 (H) 8.9 - 10.3 mg/dL   Total Protein 7.7 6.5 - 8.1 g/dL   Albumin 3.7 3.5 - 5.0 g/dL   AST 38 15 - 41 U/L   ALT 36 0 - 44 U/L   Alkaline Phosphatase 75 38 - 126 U/L   Total Bilirubin 0.4 0.3 - 1.2 mg/dL   GFR calc non Af Amer >60 >60 mL/min   GFR calc Af Amer >60 >60 mL/min   Anion gap 12 5 - 15    Comment: Performed at [facility], [address], [place], [place] [address]  Lipase, blood     Status: None   Collection Time: 08/02/19  6:26 AM  Result Value Ref Range   Lipase 25 11 - 51 U/L    Comment: Performed at [facility] [address] [place], [place] [address]  Urinalysis, Routine w reflex microscopic     Status: Abnormal   Collection Time: 08/02/19  6:43 AM  Result Value Ref Range   Color, Urine YELLOW YELLOW   APPearance HAZY (A) CLEAR   Specific Gravity, Urine 1.026 1.005 - 1.030   pH 7.0 5.0 - 8.0   Glucose, UA NEGATIVE NEGATIVE mg/dL   Hgb urine dipstick NEGATIVE NEGATIVE   Bilirubin Urine NEGATIVE NEGATIVE   Ketones, ur NEGATIVE NEGATIVE mg/dL   Protein, ur 30 (A) NEGATIVE mg/dL   Nitrite NEGATIVE NEGATIVE   Leukocytes,Ua NEGATIVE NEGATIVE   RBC / HPF 0-5 0 - 5 RBC/hpf   WBC, UA 0-5 0 - 5 WBC/hpf   Bacteria, UA NONE SEEN NONE SEEN   Squamous Epithelial / LPF 0-5 0 - 5   Mucus PRESENT    Amorphous Crystal PRESENT     Comment: Performed at [facility], [place] [address] [place], [place] [address]   IMAGING: CT ABDOMEN PELVIS W CONTRAST  Result Date: 08/02/2019 CLINICAL DATA:  Nausea, abdominal bloating. EXAM: CT ABDOMEN AND PELVIS WITH CONTRAST TECHNIQUE: Multidetector CT imaging of the abdomen and pelvis was performed using the standard protocol following bolus administration of intravenous contrast. CONTRAST:  171mL OMNIPAQUE IOHEXOL 300 MG/ML  SOLN COMPARISON:  May 01, 2019. FINDINGS: Lower chest: No acute abnormality. Hepatobiliary: No focal liver  abnormality is seen. No gallstones, gallbladder wall thickening, or biliary dilatation. Pancreas: Unremarkable. No pancreatic ductal dilatation or surrounding inflammatory changes. Spleen: Normal in size without focal abnormality. Adrenals/Urinary Tract: Adrenal glands are unremarkable. Kidneys are normal, without renal calculi, focal lesion, or hydronephrosis. Bladder is unremarkable. Stomach/Bowel: The stomach and appendix are unremarkable. No colonic dilatation is noted. However, there is significantly dilated and thickened small bowel loops in the pelvis with probable transition zone seen in the pelvis best seen on image number 70 of series 3. Vascular/Lymphatic: No significant vascular findings are present. No enlarged abdominal or pelvic lymph nodes. Reproductive: Status post hysterectomy. No adnexal masses. Other: No abdominal wall hernia or abnormality. No abdominopelvic ascites. Musculoskeletal: No acute or significant osseous findings. IMPRESSION: Dilated and thickened small bowel loops are noted in the pelvis with probable transition zone seen in the region of the ileum, resulting in distal small bowel obstruction. This is most likely due  to adhesion. Electronically Signed   By: Marijo Conception M.D.   On: 08/02/2019 09:27       Assessment and Plan :Becky Koch is a 48 y.o. [id number] admitted for concern for SBO on imaging s/p TVH, BSO on 07/28/19. Presented for severe pain starting POD#4, also with nausea/vomiting. Admitted to General Surgery, pain improved with pain meds. Per pt, gen surg plans to put in an NGT. Reviewed NGT with patient, conservative management and need for bowel rest. Reviewed plan will be per General Surgery. She verbalizes understanding and is in agreement with plan. No need for vaginal exam at this point, denies bleeding.  Appreciate General Surgery recommendations/management. Thank you for this consult, we will follow along, please call with further questions.   For  OB/GYN questions, please call the [facility] Monday - Friday, 8 am - 5 pm: [phone] All other times: [phone]    K. Arvilla Meres, M.D. Attending [place], [facility] for [employer], [place]

## 2019-08-02 NOTE — ED Notes (Signed)
Patient transported to CT 

## 2019-08-02 NOTE — ED Notes (Signed)
Attempted NG tube placement X2 without success. Pt unable to tolerated.

## 2019-08-02 NOTE — H&P (Signed)
[facility] Admission Note  Becky Koch 19-Jul-1971  [id number].    Requesting MD: Crist Infante Chief Complaint: Abdominal pain nausea and vomiting Reason for Consult: Small bowel obstruction   HPI:  Patient is a 48 year old female who presented to the ED with abdominal pain nausea and vomiting.  Started having gas-like pains on 07/28/2019 after her surgery that has become progressively worse.  She has been using over-the-counter stool softeners and gas medications without relief.  She started vomiting last evening with progressive pain.  She had no bowel movement for the first 48 hours and she has had some small "squishy" she has been taking ibuprofen and Percocet for pain without relief.  She underwent transvaginal hysterectomy and bilateral salpingo-oophorectomies on 07/28/2019, by Dr. Clovia Cuff.  Work-up in the ED shows she is afebrile blood pressure is elevated.  Labs are unremarkable.  CT scan showed the stomach and appendix were unremarkable.  There is no colonic dilatation there is significantly dilated thickened loops of small bowel in the pelvis with a probable transition zone seen best in the pelvis.  Dr. Alease Medina group to deferred evaluation to general surgery.  She vomited a lot this a.m. added stop but she is beginning to have nausea again.  ROS: Review of Systems  Constitutional: Negative.   HENT: Negative.   Eyes: Negative.   Respiratory: Negative.   Cardiovascular: Negative.   Gastrointestinal: Positive for abdominal pain, diarrhea, heartburn, nausea and vomiting.       Colonoscopy about a month ago.  Genitourinary: Negative.   Musculoskeletal: Negative.   Skin: Negative.   Neurological: Negative.   Endo/Heme/Allergies: Negative.   Psychiatric/Behavioral: Negative.     Family History  Problem Relation Age of Onset  . Cancer Paternal Grandfather   . Colon cancer Neg Hx   . Colon polyps Neg Hx   . Esophageal cancer Neg Hx   . Rectal cancer Neg Hx   .  Stomach cancer Neg Hx     Past Medical History:  Diagnosis Date  . Allergy   . Anemia   . Blood transfusion without reported diagnosis   .  Uterine fibroids, menorrhagia, dyspareunia and pelvic pain   . GERD (gastroesophageal reflux disease)   . Hypertension     Past Surgical History:  Procedure Laterality Date  . TUBAL LIGATION    . VAGINAL HYSTERECTOMY Bilateral 07/28/2019   Procedure: HYSTERECTOMY VAGINAL WITH BILATERAL SALPINGECTOMY;  Surgeon: Emily Filbert, MD;  Location: [facility];  Service: Gynecology;  Laterality: Bilateral;    Social History:  reports that she quit smoking about 4 months ago. Her smoking use included cigarettes. She smoked 0.50 packs per day. She has never used smokeless tobacco. She reports current alcohol use. She reports that she does not use drugs.  Allergies: No Known Allergies  Prior to Admission medications   Medication Sig Start Date End Date Taking? Authorizing Provider  amLODipine (NORVASC) 10 MG tablet TAKE 1 TABLET (10 MG TOTAL) BY MOUTH DAILY. 06/16/19  Yes Fulp, Cammie, MD  cetirizine (ZYRTEC) 10 MG tablet Take 1 tablet (10 mg total) by mouth daily. Patient taking differently: Take 10 mg by mouth daily as needed for allergies.  12/05/18  Yes Kerin Perna, NP  hydrochlorothiazide (HYDRODIURIL) 25 MG tablet TAKE 1 TABLET(25 MG) BY MOUTH DAILY Patient taking differently: Take 25 mg by mouth daily.  06/16/19  Yes Fulp, Cammie, MD  Metoprolol Succinate 25 MG CS24 Take 25 mg by mouth daily. 04/15/19  Yes  Fulp, Cammie, MD  omeprazole (PRILOSEC) 20 MG capsule TAKE 1 CAPSULE BY MOUTH DAILY. Patient taking differently: Take 20 mg by mouth daily.  06/16/19  Yes Fulp, Cammie, MD  oxyCODONE-acetaminophen (PERCOCET/ROXICET) 5-325 MG tablet Take 1 tablet by mouth every 6 (six) hours as needed. Patient taking differently: Take 1 tablet by mouth every 6 (six) hours as needed for moderate pain.  07/28/19  Yes Dove, Myra C, MD  traZODone  (DESYREL) 50 MG tablet Take 0.5-1 tablets (25-50 mg total) by mouth at bedtime as needed for sleep. Patient taking differently: Take 50 mg by mouth at bedtime as needed for sleep.  02/18/19  Yes Argentina Donovan, PA-C  estrogens, conjugated, (PREMARIN) 0.625 MG tablet Take 1 tablet (0.625 mg total) by mouth daily. Take daily for 21 days then do not take for 7 days. 07/28/19   Emily Filbert, MD  hydrocortisone 2.5 % ointment Apply topically 2 (two) times daily. Patient taking differently: Apply 1 application topically 2 (two) times daily.  04/02/19   Lind Covert, MD  ibuprofen (ADVIL) 600 MG tablet Take 1 tablet (600 mg total) by mouth every 6 (six) hours as needed. 07/28/19   Dove, Wilhemina Cash, MD     Blood pressure (!) 139/96, pulse 91, temperature 98.5 F (36.9 C), temperature source Oral, resp. rate 18, height 5' 5.5" (1.664 m), weight 74.4 kg, last menstrual period 07/24/2019, SpO2 100 %. Physical Exam: Physical Exam  Results for orders placed or performed during the hospital encounter of 08/02/19 (from the past 48 hour(s))  CBC with Differential     Status: None   Collection Time: 08/02/19  6:26 AM  Result Value Ref Range   WBC 9.0 4.0 - 10.5 K/uL   RBC 4.80 3.87 - 5.11 MIL/uL   Hemoglobin 14.1 12.0 - 15.0 g/dL   HCT 43.1 36.0 - 46.0 %   MCV 89.8 80.0 - 100.0 fL   MCH 29.4 26.0 - 34.0 pg   MCHC 32.7 30.0 - 36.0 g/dL   RDW 12.5 11.5 - 15.5 %   Platelets 391 150 - 400 K/uL   nRBC 0.0 0.0 - 0.2 %   Neutrophils Relative % 78 %   Neutro Abs 7.0 1.7 - 7.7 K/uL   Lymphocytes Relative 13 %   Lymphs Abs 1.1 0.7 - 4.0 K/uL   Monocytes Relative 8 %   Monocytes Absolute 0.7 0.1 - 1.0 K/uL   Eosinophils Relative 1 %   Eosinophils Absolute 0.1 0.0 - 0.5 K/uL   Basophils Relative 0 %   Basophils Absolute 0.0 0.0 - 0.1 K/uL   Immature Granulocytes 0 %   Abs Immature Granulocytes 0.04 0.00 - 0.07 K/uL    Comment: Performed at [facility], [address], [place], [place] [address]   Comprehensive metabolic panel     Status: Abnormal   Collection Time: 08/02/19  6:26 AM  Result Value Ref Range   Sodium 141 135 - 145 mmol/L   Potassium 3.9 3.5 - 5.1 mmol/L   Chloride 102 98 - 111 mmol/L   CO2 27 22 - 32 mmol/L   Glucose, Bld 148 (H) 70 - 99 mg/dL   BUN 9 6 - 20 mg/dL   Creatinine, Ser 0.90 0.44 - 1.00 mg/dL   Calcium 10.4 (H) 8.9 - 10.3 mg/dL   Total Protein 7.7 6.5 - 8.1 g/dL   Albumin 3.7 3.5 - 5.0 g/dL   AST 38 15 - 41 U/L   ALT 36 0 -  44 U/L   Alkaline Phosphatase 75 38 - 126 U/L   Total Bilirubin 0.4 0.3 - 1.2 mg/dL   GFR calc non Af Amer >60 >60 mL/min   GFR calc Af Amer >60 >60 mL/min   Anion gap 12 5 - 15    Comment: Performed at [facility] [address] [place], [place] [address]  Lipase, blood     Status: None   Collection Time: 08/02/19  6:26 AM  Result Value Ref Range   Lipase 25 11 - 51 U/L    Comment: Performed at [facility] [address] [place], [place] [address]  Urinalysis, Routine w reflex microscopic     Status: Abnormal   Collection Time: 08/02/19  6:43 AM  Result Value Ref Range   Color, Urine YELLOW YELLOW   APPearance HAZY (A) CLEAR   Specific Gravity, Urine 1.026 1.005 - 1.030   pH 7.0 5.0 - 8.0   Glucose, UA NEGATIVE NEGATIVE mg/dL   Hgb urine dipstick NEGATIVE NEGATIVE   Bilirubin Urine NEGATIVE NEGATIVE   Ketones, ur NEGATIVE NEGATIVE mg/dL   Protein, ur 30 (A) NEGATIVE mg/dL   Nitrite NEGATIVE NEGATIVE   Leukocytes,Ua NEGATIVE NEGATIVE   RBC / HPF 0-5 0 - 5 RBC/hpf   WBC, UA 0-5 0 - 5 WBC/hpf   Bacteria, UA NONE SEEN NONE SEEN   Squamous Epithelial / LPF 0-5 0 - 5   Mucus PRESENT    Amorphous Crystal PRESENT     Comment: Performed at [facility], [place] [address], [place], [place] [address]   CT ABDOMEN PELVIS W CONTRAST  Result Date: 08/02/2019 CLINICAL DATA:  Nausea, abdominal bloating. EXAM: CT ABDOMEN AND PELVIS WITH CONTRAST TECHNIQUE: Multidetector CT imaging of the abdomen and  pelvis was performed using the standard protocol following bolus administration of intravenous contrast. CONTRAST:  159mL OMNIPAQUE IOHEXOL 300 MG/ML  SOLN COMPARISON:  May 01, 2019. FINDINGS: Lower chest: No acute abnormality. Hepatobiliary: No focal liver abnormality is seen. No gallstones, gallbladder wall thickening, or biliary dilatation. Pancreas: Unremarkable. No pancreatic ductal dilatation or surrounding inflammatory changes. Spleen: Normal in size without focal abnormality. Adrenals/Urinary Tract: Adrenal glands are unremarkable. Kidneys are normal, without renal calculi, focal lesion, or hydronephrosis. Bladder is unremarkable. Stomach/Bowel: The stomach and appendix are unremarkable. No colonic dilatation is noted. However, there is significantly dilated and thickened small bowel loops in the pelvis with probable transition zone seen in the pelvis best seen on image number 70 of series 3. Vascular/Lymphatic: No significant vascular findings are present. No enlarged abdominal or pelvic lymph nodes. Reproductive: Status post hysterectomy. No adnexal masses. Other: No abdominal wall hernia or abnormality. No abdominopelvic ascites. Musculoskeletal: No acute or significant osseous findings. IMPRESSION: Dilated and thickened small bowel loops are noted in the pelvis with probable transition zone seen in the region of the ileum, resulting in distal small bowel obstruction. This is most likely due to adhesion. Electronically Signed   By: Marijo Conception M.D.   On: 08/02/2019 09:27      Assessment/Plan Hypertension Uterine fibroids, menorrhagia, dyspareunia and pelvic pain GERD History of tobacco use  SBO S/p transvaginal hysterectomy/salpingo-oophorectomy 07/28/2019 Dr. Clovia Cuff  FEN:  NPO/IV fluids ID:  None DVT: Lovenox Follow up:  TBD  Plan: Admit, NG placement with bowel decompression, IV hydration, small bowel protocol.   Earnstine Regal [facility]  Surgery 08/02/2019, 9:45 AM Please see Amion for pager number during day hours 7:00am-4:30pm

## 2019-08-02 NOTE — Progress Notes (Signed)
Pt next radiology x-ray due 60mn.

## 2019-08-02 NOTE — Progress Notes (Signed)
Pt new admit from ED Dx SBO alert and oriented, room air, with peripheral IV line, for insertion of NGT, attempted 1x but refused will try again, husband at the bedside, given Morphine for pain and relieved.

## 2019-08-02 NOTE — ED Notes (Signed)
Pt enforcing nausea and increased pain. Asking for meds. MD notified.

## 2019-08-02 NOTE — ED Provider Notes (Signed)
[facility] EMERGENCY DEPARTMENT Provider Note   CSN: [id number] Arrival date & time: 08/02/19  [id number]     History Chief Complaint  Patient presents with  . Abdominal Pain    Becky Koch is a 48 y.o. female.  HPI  48 year old female presents with abdominal pain and vomiting. 5 days ago had a transvaginal hysterectomy. Now having upper abdominal pain, feels like gas. Has had BMs but not as big as normal. Started vomiting last night prior to arrival to ED. Vomited in room just prior to me seeing here. Pain is severe. Has been taking ibuprofen and percocet. No fevers. No vaginal bleeding.   Past Medical History:  Diagnosis Date  . Allergy   . Anemia   . Blood transfusion without reported diagnosis   . Fibroid, uterine   . GERD (gastroesophageal reflux disease)   . Hypertension     Patient Active Problem List   Diagnosis Date Noted  . DUB (dysfunctional uterine bleeding) 07/28/2019  . Post-operative state 07/28/2019  . Gastroesophageal reflux disease with esophagitis 12/05/2018  . Tobacco use 06/23/2018  . Actinic keratoses 05/02/2017  . Dysmenorrhea 07/14/2012  . Menorrhagia 07/14/2012  . History of uterine fibroid 07/14/2012  . HTN (hypertension) 07/14/2012    Past Surgical History:  Procedure Laterality Date  . TUBAL LIGATION    . VAGINAL HYSTERECTOMY Bilateral 07/28/2019   Procedure: HYSTERECTOMY VAGINAL WITH BILATERAL SALPINGECTOMY;  Surgeon: Emily Filbert, MD;  Location: [facility];  Service: Gynecology;  Laterality: Bilateral;     OB History    Gravida  8   Para  4   Term  4   Preterm      AB  4   Living  4     SAB  1   TAB  3   Ectopic      Multiple      Live Births              Family History  Problem Relation Age of Onset  . Cancer Paternal Grandfather   . Colon cancer Neg Hx   . Colon polyps Neg Hx   . Esophageal cancer Neg Hx   . Rectal cancer Neg Hx   . Stomach cancer Neg Hx     Social  History   Tobacco Use  . Smoking status: Former Smoker    Packs/day: 0.50    Types: Cigarettes    Quit date: 03/21/2019    Years since quitting: 0.3  . Smokeless tobacco: Never Used  Substance Use Topics  . Alcohol use: Yes    Comment: social alcohol  . Drug use: No    Home Medications Prior to Admission medications   Medication Sig Start Date End Date Taking? Authorizing Provider  alum & mag hydroxide-simeth (MAALOX/MYLANTA) 200-200-20 MG/5ML suspension Take 15 mLs by mouth every 6 (six) hours as needed for indigestion or heartburn.   Yes [provider]  amLODipine (NORVASC) 10 MG tablet TAKE 1 TABLET (10 MG TOTAL) BY MOUTH DAILY. 06/16/19  Yes Fulp, Cammie, MD  cetirizine (ZYRTEC) 10 MG tablet Take 1 tablet (10 mg total) by mouth daily. Patient taking differently: Take 10 mg by mouth daily as needed for allergies.  12/05/18  Yes Kerin Perna, NP  docusate sodium (COLACE) 100 MG capsule Take 100 mg by mouth 2 (two) times daily.   Yes [provider]  hydrochlorothiazide (HYDRODIURIL) 25 MG tablet TAKE 1 TABLET(25 MG) BY MOUTH DAILY Patient  taking differently: Take 25 mg by mouth daily.  06/16/19  Yes Fulp, Cammie, MD  hydrocortisone 2.5 % ointment Apply topically 2 (two) times daily. Patient taking differently: Apply 1 application topically 2 (two) times daily.  04/02/19  Yes Lind Covert, MD  Metoprolol Succinate 25 MG CS24 Take 25 mg by mouth daily. 04/15/19  Yes Fulp, Cammie, MD  omeprazole (PRILOSEC) 20 MG capsule TAKE 1 CAPSULE BY MOUTH DAILY. Patient taking differently: Take 20 mg by mouth daily.  06/16/19  Yes Fulp, Cammie, MD  oxyCODONE-acetaminophen (PERCOCET/ROXICET) 5-325 MG tablet Take 1 tablet by mouth every 6 (six) hours as needed. Patient taking differently: Take 1 tablet by mouth every 6 (six) hours as needed for moderate pain.  07/28/19  Yes Dove, Myra C, MD  traZODone (DESYREL) 50 MG tablet Take 0.5-1 tablets (25-50 mg total) by mouth at  bedtime as needed for sleep. Patient taking differently: Take 50 mg by mouth at bedtime as needed for sleep.  02/18/19  Yes Argentina Donovan, PA-C  estrogens, conjugated, (PREMARIN) 0.625 MG tablet Take 1 tablet (0.625 mg total) by mouth daily. Take daily for 21 days then do not take for 7 days. Patient not taking: Reported on 08/02/2019 07/28/19   Emily Filbert, MD  ibuprofen (ADVIL) 600 MG tablet Take 1 tablet (600 mg total) by mouth every 6 (six) hours as needed. Patient not taking: Reported on 08/02/2019 07/28/19   Emily Filbert, MD    Allergies    Patient has no known allergies.  Review of Systems   Review of Systems  Constitutional: Negative for fever.  Gastrointestinal: Positive for abdominal pain and vomiting. Negative for diarrhea.  Genitourinary: Negative for vaginal bleeding.  All other systems reviewed and are negative.   Physical Exam Updated Vital Signs BP (!) 136/97   Pulse 79   Temp 98.5 F (36.9 C) (Oral)   Resp 16   Ht 5' 5.5" (1.664 m)   Wt 74.4 kg   LMP 07/24/2019   SpO2 98%   BMI 26.88 kg/m   Physical Exam Vitals and nursing note reviewed.  Constitutional:      Appearance: She is well-developed. She is not ill-appearing or diaphoretic.  HENT:     Head: Normocephalic and atraumatic.     Right Ear: External ear normal.     Left Ear: External ear normal.     Nose: Nose normal.  Eyes:     General:        Right eye: No discharge.        Left eye: No discharge.  Cardiovascular:     Rate and Rhythm: Normal rate and regular rhythm.     Heart sounds: Normal heart sounds.  Pulmonary:     Effort: Pulmonary effort is normal.     Breath sounds: Normal breath sounds.  Abdominal:     Palpations: Abdomen is soft.     Tenderness: There is generalized abdominal tenderness.  Skin:    General: Skin is warm and dry.  Neurological:     Mental Status: She is alert.  Psychiatric:        Mood and Affect: Mood is not anxious.     ED Results / Procedures /  Treatments   Labs (all labs ordered are listed, but only abnormal results are displayed) Labs Reviewed  COMPREHENSIVE METABOLIC PANEL - Abnormal; Notable for the following components:      Result Value   Glucose, Bld 148 (*)    Calcium 10.4 (*)  All other components within normal limits  URINALYSIS, ROUTINE W REFLEX MICROSCOPIC - Abnormal; Notable for the following components:   APPearance HAZY (*)    Protein, ur 30 (*)    All other components within normal limits  RESPIRATORY PANEL BY RT PCR (FLU A&B, COVID)  CBC WITH DIFFERENTIAL/PLATELET  LIPASE, BLOOD    EKG None  Radiology CT ABDOMEN PELVIS W CONTRAST  Result Date: 08/02/2019 CLINICAL DATA:  Nausea, abdominal bloating. EXAM: CT ABDOMEN AND PELVIS WITH CONTRAST TECHNIQUE: Multidetector CT imaging of the abdomen and pelvis was performed using the standard protocol following bolus administration of intravenous contrast. CONTRAST:  185mL OMNIPAQUE IOHEXOL 300 MG/ML  SOLN COMPARISON:  May 01, 2019. FINDINGS: Lower chest: No acute abnormality. Hepatobiliary: No focal liver abnormality is seen. No gallstones, gallbladder wall thickening, or biliary dilatation. Pancreas: Unremarkable. No pancreatic ductal dilatation or surrounding inflammatory changes. Spleen: Normal in size without focal abnormality. Adrenals/Urinary Tract: Adrenal glands are unremarkable. Kidneys are normal, without renal calculi, focal lesion, or hydronephrosis. Bladder is unremarkable. Stomach/Bowel: The stomach and appendix are unremarkable. No colonic dilatation is noted. However, there is significantly dilated and thickened small bowel loops in the pelvis with probable transition zone seen in the pelvis best seen on image number 70 of series 3. Vascular/Lymphatic: No significant vascular findings are present. No enlarged abdominal or pelvic lymph nodes. Reproductive: Status post hysterectomy. No adnexal masses. Other: No abdominal wall hernia or abnormality. No  abdominopelvic ascites. Musculoskeletal: No acute or significant osseous findings. IMPRESSION: Dilated and thickened small bowel loops are noted in the pelvis with probable transition zone seen in the region of the ileum, resulting in distal small bowel obstruction. This is most likely due to adhesion. Electronically Signed   By: Marijo Conception M.D.   On: 08/02/2019 09:27    Procedures Procedures (including critical care time)  Medications Ordered in ED Medications  0.9 %  sodium chloride infusion (has no administration in time range)  ondansetron (ZOFRAN) injection 4 mg (has no administration in time range)  HYDROmorphone (DILAUDID) injection 1 mg (has no administration in time range)  HYDROmorphone (DILAUDID) injection 1 mg (1 mg Intravenous Given 08/02/19 0827)  ondansetron (ZOFRAN) injection 4 mg (4 mg Intravenous Given 08/02/19 0827)  sodium chloride 0.9 % bolus 1,000 mL ( Intravenous Restarted 08/02/19 0942)  iohexol (OMNIPAQUE) 300 MG/ML solution 100 mL (100 mLs Intravenous Contrast Given 08/02/19 [id number])    ED Course  I have reviewed the triage vital signs and the nursing notes.  Pertinent labs & imaging results that were available during my care of the patient were reviewed by me and considered in my medical decision making (see chart for details).    MDM Rules/Calculators/A&P     CHA2DS2/VAS Stroke Risk Points      N/A >= 2 Points: High Risk  1 - 1.99 Points: Medium Risk  0 Points: Low Risk    A final score could not be computed because of missing components.: Last  Change: N/A     This score determines the patient's risk of having a stroke if the  patient has atrial fibrillation.      This score is not applicable to this patient. Components are not  calculated.                   I discussed with Dr. Rosana Hoes of GYN. They will consult but would be unable to operate in this scenario.  Discussed with general surgery for admission.  After initial  treatment in the ED, no  nausea/vomiting. Pain is controlled. Will need admission for SBO. General surgery asks for NG.  Marilyn MARIYA AGA was evaluated in Emergency Department on 08/02/2019 for the symptoms described in the history of present illness. She was evaluated in the context of the global COVID-19 pandemic, which necessitated consideration that the patient might be at risk for infection with the SARS-CoV-2 virus that causes COVID-19. Institutional protocols and algorithms that pertain to the evaluation of patients at risk for COVID-19 are in a state of rapid change based on information released by regulatory bodies including the CDC and federal and state organizations. These policies and algorithms were followed during the patient's care in the ED.  Final Clinical Impression(s) / ED Diagnoses Final diagnoses:  Small bowel obstruction [facility])    Rx / DC Orders ED Discharge Orders    None       Sherwood Gambler, MD 08/02/19 1037

## 2019-08-02 NOTE — Progress Notes (Signed)
Pt NGT placed at left nare, radiology informed.

## 2019-08-02 NOTE — ED Notes (Signed)
Removed IV. Will start another line to complete fluid bolus.

## 2019-08-03 ENCOUNTER — Inpatient Hospital Stay (HOSPITAL_COMMUNITY): Payer: Self-pay | Admitting: Certified Registered"

## 2019-08-03 ENCOUNTER — Encounter (HOSPITAL_COMMUNITY): Admission: EM | Disposition: A | Discharge status: Home / Self Care | Payer: Self-pay | Source: Home / Self Care

## 2019-08-03 ENCOUNTER — Inpatient Hospital Stay (HOSPITAL_COMMUNITY): Payer: Self-pay

## 2019-08-03 ENCOUNTER — Encounter (HOSPITAL_COMMUNITY): Payer: Self-pay

## 2019-08-03 HISTORY — PX: LAPAROSCOPIC LYSIS OF ADHESIONS: SHX5905

## 2019-08-03 HISTORY — PX: LAPAROSCOPY: SHX197

## 2019-08-03 LAB — HIV ANTIBODY (ROUTINE TESTING W REFLEX): HIV Screen 4th Generation wRfx: NONREACTIVE

## 2019-08-03 LAB — CBC
HCT: 42.1 % (ref 36.0–46.0)
Hemoglobin: 13.7 g/dL (ref 12.0–15.0)
MCH: 29 pg (ref 26.0–34.0)
MCHC: 32.5 g/dL (ref 30.0–36.0)
MCV: 89.2 fL (ref 80.0–100.0)
Platelets: 374 10*3/uL (ref 150–400)
RBC: 4.72 MIL/uL (ref 3.87–5.11)
RDW: 12.7 % (ref 11.5–15.5)
WBC: 9.3 10*3/uL (ref 4.0–10.5)
nRBC: 0 % (ref 0.0–0.2)

## 2019-08-03 LAB — BASIC METABOLIC PANEL
Anion gap: 9 (ref 5–15)
BUN: 6 mg/dL (ref 6–20)
CO2: 23 mmol/L (ref 22–32)
Calcium: 9 mg/dL (ref 8.9–10.3)
Chloride: 110 mmol/L (ref 98–111)
Creatinine, Ser: 0.76 mg/dL (ref 0.44–1.00)
GFR calc Af Amer: >60 mL/min (ref >60)
GFR calc non Af Amer: >60 mL/min (ref >60)
Glucose, Bld: 97 mg/dL (ref 70–99)
Potassium: 4.2 mmol/L (ref 3.5–5.1)
Sodium: 142 mmol/L (ref 135–145)

## 2019-08-03 LAB — RESPIRATORY PANEL BY RT PCR (FLU A&B, COVID)
Influenza A by PCR: NEGATIVE
Influenza B by PCR: NEGATIVE
SARS Coronavirus 2 by RT PCR: NEGATIVE

## 2019-08-03 LAB — MRSA PCR SCREENING: MRSA by PCR: NEGATIVE

## 2019-08-03 LAB — TYPE AND SCREEN
ABO/RH(D): A POS
Antibody Screen: NEGATIVE

## 2019-08-03 SURGERY — LAPAROSCOPY, DIAGNOSTIC
Anesthesia: General | Site: Abdomen

## 2019-08-03 MED ORDER — SODIUM CHLORIDE 0.9 % IR SOLN
Status: DC | PRN
  Administered 2019-08-03: 1000 mL

## 2019-08-03 MED ORDER — PROMETHAZINE HCL 25 MG/ML IJ SOLN: 6.2500 mg | INTRAMUSCULAR | Status: DC | PRN

## 2019-08-03 MED ORDER — STERILE WATER FOR IRRIGATION IR SOLN
Status: DC | PRN
  Administered 2019-08-03: 1000 mL

## 2019-08-03 MED ORDER — ONDANSETRON HCL 4 MG/2ML IJ SOLN
INTRAMUSCULAR | Status: DC | PRN
  Administered 2019-08-03: 4 mg via INTRAVENOUS

## 2019-08-03 MED ORDER — FENTANYL CITRATE (PF) 250 MCG/5ML IJ SOLN
INTRAMUSCULAR | Status: AC
  Filled 2019-08-03: qty 5

## 2019-08-03 MED ORDER — KETOROLAC TROMETHAMINE 15 MG/ML IJ SOLN
15.0000 mg | Freq: Three times a day (TID) | INTRAMUSCULAR | Status: DC | PRN
  Administered 2019-08-04 – 2019-08-06 (×4): 15 mg via INTRAVENOUS
  Filled 2019-08-03 (×4): qty 1

## 2019-08-03 MED ORDER — LIDOCAINE 2% (20 MG/ML) 5 ML SYRINGE
INTRAMUSCULAR | Status: DC | PRN
  Administered 2019-08-03: 60 mg via INTRAVENOUS

## 2019-08-03 MED ORDER — ROCURONIUM BROMIDE 10 MG/ML (PF) SYRINGE
PREFILLED_SYRINGE | INTRAVENOUS | Status: AC
  Filled 2019-08-03: qty 10

## 2019-08-03 MED ORDER — PROPOFOL 10 MG/ML IV BOLUS
INTRAVENOUS | Status: AC
  Filled 2019-08-03: qty 20

## 2019-08-03 MED ORDER — ACETAMINOPHEN 500 MG PO TABS
1000.0000 mg | ORAL_TABLET | Freq: Once | ORAL | Status: AC
  Administered 2019-08-03: 1000 mg via ORAL
  Filled 2019-08-03: qty 2

## 2019-08-03 MED ORDER — CHLORHEXIDINE GLUCONATE CLOTH 2 % EX PADS
6.0000 | MEDICATED_PAD | Freq: Every day | CUTANEOUS | Status: DC
  Administered 2019-08-03 – 2019-08-05 (×3): 6 via TOPICAL

## 2019-08-03 MED ORDER — PHENYLEPHRINE HCL-NACL 10-0.9 MG/250ML-% IV SOLN
INTRAVENOUS | Status: DC | PRN
  Administered 2019-08-03: 30 ug/min via INTRAVENOUS

## 2019-08-03 MED ORDER — EPHEDRINE SULFATE-NACL 50-0.9 MG/10ML-% IV SOSY: PREFILLED_SYRINGE | INTRAVENOUS | Status: DC | PRN

## 2019-08-03 MED ORDER — LACTATED RINGERS IV SOLN
INTRAVENOUS | Status: DC | PRN
  Administered 2019-08-03 (×2): via INTRAVENOUS

## 2019-08-03 MED ORDER — SUCCINYLCHOLINE CHLORIDE 200 MG/10ML IV SOSY
PREFILLED_SYRINGE | INTRAVENOUS | Status: DC | PRN
  Administered 2019-08-03: 100 mg via INTRAVENOUS

## 2019-08-03 MED ORDER — SUGAMMADEX SODIUM 200 MG/2ML IV SOLN
INTRAVENOUS | Status: DC | PRN
  Administered 2019-08-03: 150 mg via INTRAVENOUS

## 2019-08-03 MED ORDER — DEXAMETHASONE SODIUM PHOSPHATE 10 MG/ML IJ SOLN
INTRAMUSCULAR | Status: DC | PRN
  Administered 2019-08-03: 10 mg via INTRAVENOUS

## 2019-08-03 MED ORDER — FENTANYL CITRATE (PF) 100 MCG/2ML IJ SOLN: 25.0000 ug | INTRAMUSCULAR | Status: DC | PRN

## 2019-08-03 MED ORDER — FENTANYL CITRATE (PF) 250 MCG/5ML IJ SOLN
INTRAMUSCULAR | Status: DC | PRN
  Administered 2019-08-03 (×3): 50 ug via INTRAVENOUS
  Administered 2019-08-03: 100 ug via INTRAVENOUS

## 2019-08-03 MED ORDER — MORPHINE SULFATE (PF) 2 MG/ML IV SOLN
2.0000 mg | INTRAVENOUS | Status: DC | PRN
  Administered 2019-08-03 – 2019-08-05 (×8): 2 mg via INTRAVENOUS
  Filled 2019-08-03 (×7): qty 1

## 2019-08-03 MED ORDER — 0.9 % SODIUM CHLORIDE (POUR BTL) OPTIME
TOPICAL | Status: DC | PRN
  Administered 2019-08-03: 1000 mL

## 2019-08-03 MED ORDER — MIDAZOLAM HCL 5 MG/5ML IJ SOLN
INTRAMUSCULAR | Status: DC | PRN
  Administered 2019-08-03: 2 mg via INTRAVENOUS

## 2019-08-03 MED ORDER — BUPIVACAINE-EPINEPHRINE 0.25% -1:200000 IJ SOLN
INTRAMUSCULAR | Status: DC | PRN
  Administered 2019-08-03: 9 mL

## 2019-08-03 MED ORDER — BUPIVACAINE-EPINEPHRINE 0.5% -1:200000 IJ SOLN
INTRAMUSCULAR | Status: AC
  Filled 2019-08-03: qty 1

## 2019-08-03 MED ORDER — PROPOFOL 10 MG/ML IV BOLUS
INTRAVENOUS | Status: DC | PRN
  Administered 2019-08-03: 100 mg via INTRAVENOUS

## 2019-08-03 MED ORDER — SCOPOLAMINE 1 MG/3DAYS TD PT72
1.0000 | MEDICATED_PATCH | TRANSDERMAL | Status: DC
  Administered 2019-08-03: 11:00:00 1.5 mg via TRANSDERMAL
  Filled 2019-08-03: qty 1

## 2019-08-03 MED ORDER — CEFAZOLIN SODIUM-DEXTROSE 2-3 GM-%(50ML) IV SOLR
INTRAVENOUS | Status: DC | PRN
  Administered 2019-08-03: 2 g via INTRAVENOUS

## 2019-08-03 MED ORDER — MIDAZOLAM HCL 2 MG/2ML IJ SOLN
INTRAMUSCULAR | Status: AC
  Filled 2019-08-03: qty 2

## 2019-08-03 MED ORDER — MORPHINE SULFATE (PF) 2 MG/ML IV SOLN
1.0000 mg | INTRAVENOUS | Status: DC | PRN
  Filled 2019-08-03: qty 1

## 2019-08-03 MED ORDER — ALBUMIN HUMAN 5 % IV SOLN
INTRAVENOUS | Status: DC | PRN
  Administered 2019-08-03: 14:00:00 via INTRAVENOUS

## 2019-08-03 MED ORDER — ROCURONIUM BROMIDE 50 MG/5ML IV SOSY
PREFILLED_SYRINGE | INTRAVENOUS | Status: DC | PRN
  Administered 2019-08-03: 30 mg via INTRAVENOUS

## 2019-08-03 MED ORDER — LACTATED RINGERS IV SOLN
INTRAVENOUS | Status: DC
  Administered 2019-08-03: 13:00:00 via INTRAVENOUS

## 2019-08-03 MED ORDER — CELECOXIB 200 MG PO CAPS
400.0000 mg | ORAL_CAPSULE | Freq: Once | ORAL | Status: AC
  Administered 2019-08-03: 400 mg via ORAL
  Filled 2019-08-03: qty 2

## 2019-08-03 SURGICAL SUPPLY — 50 items
ADH SKN CLS APL DERMABOND .7 (GAUZE/BANDAGES/DRESSINGS) ×2
APL PRP STRL LF DISP 70% ISPRP (MISCELLANEOUS) ×2
CANISTER SUCT 3000ML PPV (MISCELLANEOUS) ×3 IMPLANT
CHLORAPREP W/TINT 26 (MISCELLANEOUS) ×3 IMPLANT
COVER SURGICAL LIGHT HANDLE (MISCELLANEOUS) ×3 IMPLANT
COVER WAND RF STERILE (DRAPES) ×3 IMPLANT
DERMABOND ADVANCED (GAUZE/BANDAGES/DRESSINGS) ×1
DERMABOND ADVANCED .7 DNX12 (GAUZE/BANDAGES/DRESSINGS) ×2 IMPLANT
DRAPE LAPAROSCOPIC ABDOMINAL (DRAPES) ×3 IMPLANT
DRAPE WARM FLUID 44X44 (DRAPES) ×3 IMPLANT
ELECT REM PT RETURN 9FT ADLT (ELECTROSURGICAL) ×3
ELECTRODE REM PT RTRN 9FT ADLT (ELECTROSURGICAL) ×2 IMPLANT
GLOVE BIO SURGEON STRL SZ7 (GLOVE) ×3 IMPLANT
GLOVE BIOGEL PI IND STRL 7.5 (GLOVE) ×2 IMPLANT
GLOVE BIOGEL PI INDICATOR 7.5 (GLOVE) ×1
GOWN STRL REUS W/ TWL LRG LVL3 (GOWN DISPOSABLE) ×7 IMPLANT
GOWN STRL REUS W/TWL LRG LVL3 (GOWN DISPOSABLE) ×12
GRASPER SUT TROCAR 14GX15 (MISCELLANEOUS) ×2 IMPLANT
HANDLE SUCTION POOLE (INSTRUMENTS) ×2 IMPLANT
KIT BASIN OR (CUSTOM PROCEDURE TRAY) ×3 IMPLANT
KIT TURNOVER KIT B (KITS) ×3 IMPLANT
LIGASURE IMPACT 36 18CM CVD LR (INSTRUMENTS) IMPLANT
NS IRRIG 1000ML POUR BTL (IV SOLUTION) ×6 IMPLANT
PAD ARMBOARD 7.5X6 YLW CONV (MISCELLANEOUS) ×3 IMPLANT
PENCIL SMOKE EVACUATOR (MISCELLANEOUS) ×3 IMPLANT
SCISSORS LAP 5X35 DISP (ENDOMECHANICALS) ×2 IMPLANT
SET IRRIG TUBING LAPAROSCOPIC (IRRIGATION / IRRIGATOR) ×2 IMPLANT
SET TUBE SMOKE EVAC HIGH FLOW (TUBING) ×3 IMPLANT
SLEEVE ENDOPATH XCEL 5M (ENDOMECHANICALS) ×3 IMPLANT
STAPLER VISISTAT 35W (STAPLE) ×3 IMPLANT
STRIP CLOSURE SKIN 1/2X4 (GAUZE/BANDAGES/DRESSINGS) ×2 IMPLANT
SUCTION POOLE HANDLE (INSTRUMENTS) ×3
SUT MNCRL AB 4-0 PS2 18 (SUTURE) ×2 IMPLANT
SUT PDS AB 1 TP1 96 (SUTURE) ×6 IMPLANT
SUT SILK 2 0 (SUTURE) ×3
SUT SILK 2 0 SH CR/8 (SUTURE) ×3 IMPLANT
SUT SILK 2 0 TIES 10X30 (SUTURE) ×2 IMPLANT
SUT SILK 2-0 18XBRD TIE 12 (SUTURE) ×2 IMPLANT
SUT SILK 3 0 (SUTURE) ×3
SUT SILK 3 0 SH CR/8 (SUTURE) ×3 IMPLANT
SUT SILK 3 0 TIES 10X30 (SUTURE) ×2 IMPLANT
SUT SILK 3-0 18XBRD TIE 12 (SUTURE) ×2 IMPLANT
SUT VICRYL 0 UR6 27IN ABS (SUTURE) ×2 IMPLANT
TOWEL GREEN STERILE (TOWEL DISPOSABLE) ×3 IMPLANT
TOWEL GREEN STERILE FF (TOWEL DISPOSABLE) ×3 IMPLANT
TRAY FOLEY MTR SLVR 16FR STAT (SET/KITS/TRAYS/PACK) ×3 IMPLANT
TRAY LAPAROSCOPIC MC (CUSTOM PROCEDURE TRAY) ×3 IMPLANT
TROCAR XCEL NON-BLD 11X100MML (ENDOMECHANICALS) ×2 IMPLANT
TROCAR XCEL NON-BLD 5MMX100MML (ENDOMECHANICALS) ×3 IMPLANT
YANKAUER SUCT BULB TIP NO VENT (SUCTIONS) ×2 IMPLANT

## 2019-08-03 NOTE — Progress Notes (Deleted)
Patient returned to unit from CT.

## 2019-08-03 NOTE — Op Note (Signed)
Preoperative diagnosis: Small bowel obstruction status post vaginal hysterectomy 4 days ago Postoperative diagnosis: Same as above Procedure: Laparoscopic lysis of adhesions Surgeon: Dr. Serita Grammes Assistant: Will Creig Hines, PA Anesthesia: General Estimated blood loss: Minimal Drains: None Specimens: None Complications: None Sponge and count was correct at completion Disposition to recovery in stable condition  Indications: This is a 48 year old healthy female who had a prior history of a tubal ligation and then underwent a vaginal hysterectomy on 8 December.  She has developed signs and symptoms of a bowel obstruction.  She was admitted over the weekend and I saw her today.  She was quite tender today and had not resolved her bowel obstruction either clinically or radiologically.  I discussed with her going to the operating room.  I discussed this with her gynecologist as well.  Procedure: After informed consent was obtained the patient was taken the operating.  She was given antibiotics.  SCDs were placed.  She was placed under general anesthesia without complication.  She had a Foley catheter placed.  She already had a nasogastric tube in place.  She was prepped and draped in the standard sterile surgical fashion.  A surgical timeout was then performed.  I infiltrated Marcaine and entered her old incision below her umbilicus.  I grasped the fascia and incised this sharply.  I entered into the peritoneum.  I placed a 0 Vicryl pursestring suture and inserted a Hassan trocar.  I insufflated the abdomen to 15 mmHg pressure.  I then inserted 2 additional 5 mm trochars in the right abdomen.  She was placed in the Trendelenburg position.  She had a lot of fluid present upon entering the abdomen as well as in her pelvis.  I then using the atraumatic graspers was able to remove the bowel from the pelvis.  There appeared to be an area near where she had her right ovary removed where there was a suture  that had adhered to the bowel.  I was able to lyse these adhesions.  This freed up the bowel completely.  I ran the bowel from the ileocecal valve well proximal to where the obstruction had been.  There were numerous very small serosal tears as well as a lot of swelling and injection of the small bowel.  The small bowel was very thickened as it showed up on the CT scan as well.  This was all viable however.  The mesentery was also very friable.  I elected to not remove this area.  Hopefully this area will heal and she will not need anything additional.  I then evacuated all the fluid.  Hemostasis was observed.  I then removed my umbilical trocar.  I tied my pursestring down and then places an additional 0 Vicryl using the suture passer device.  I remove remaining trochars.  These were closed with 4-0 Monocryl.  She tolerated this well was extubated and transferred to recovery stable.

## 2019-08-03 NOTE — Progress Notes (Signed)
Patient returned to unit from OR, VSS. Patient settled in bed. Patient has 3 lap sites in place with steri strips and skin glue. NG tube placed to low intermittent suctions as per orders. Patient c/o pain, medicated per orders. Foley catheter is in place. Pt resting in bed with call bell in reach and side rails up. Family is at bedside. Instructed patient to utilize call bell for assistance. Will continue to monitor closely for remainder of shift.

## 2019-08-03 NOTE — Anesthesia Procedure Notes (Signed)
Procedure Name: Intubation Date/Time: 08/03/2019 1:44 PM Performed by: Griffin Dakin, CRNA Pre-anesthesia Checklist: Patient identified, Emergency Drugs available, Suction available and Patient being monitored Patient Re-evaluated:Patient Re-evaluated prior to induction Oxygen Delivery Method: Circle system utilized Preoxygenation: Pre-oxygenation with 100% oxygen Induction Type: IV induction, Rapid sequence and Cricoid Pressure applied Laryngoscope Size: Mac and 3 Grade View: Grade I Tube type: Oral Number of attempts: 1 Airway Equipment and Method: Stylet Placement Confirmation: ETT inserted through vocal cords under direct vision,  positive ETCO2 and breath sounds checked- equal and bilateral Secured at: 23 cm Tube secured with: Tape Dental Injury: Teeth and Oropharynx as per pre-operative assessment

## 2019-08-03 NOTE — Progress Notes (Signed)
Subjective: Patient reports no problems voiding. She has not passed gas since Friday.  Objective: I have reviewed patient's vital signs, intake and output, medications and pathology.  General: alert Resp: clear to auscultation bilaterally Cardio: regular rate and rhythm, S1, S2 normal, no murmur, click, rub or gallop GI: hyperactive bowel sounds, + tympany with moderate distension   Assessment/Plan: SBO/partial SBO- continue NGT at present. Follow with repeat films  LOS: 1 day    Becky Koch 08/03/2019, 8:32 AM

## 2019-08-03 NOTE — Anesthesia Postprocedure Evaluation (Signed)
Anesthesia Post Note  Patient: Shyniece M Bentz  Procedure(s) Performed: LAPAROSCOPY DIAGNOSTIC (N/A Abdomen) Laparoscopic Lysis Of Adhesions (N/A Abdomen)     Patient location during evaluation: PACU Anesthesia Type: General Level of consciousness: awake and alert Pain management: pain level controlled Vital Signs Assessment: post-procedure vital signs reviewed and stable Respiratory status: spontaneous breathing, nonlabored ventilation, respiratory function stable and patient connected to nasal cannula oxygen Cardiovascular status: blood pressure returned to baseline and stable Postop Assessment: no apparent nausea or vomiting Anesthetic complications: no    Last Vitals:  Vitals:   08/03/19 1511 08/03/19 1526  BP: (!) 141/95 (!) 142/92  Pulse: 86 82  Resp: 20 16  Temp:  36.6 C  SpO2: 93% 95%    Last Pain:  Vitals:   08/03/19 1511  TempSrc:   PainSc: Asleep                 Myrtha Tonkovich,W. EDMOND

## 2019-08-03 NOTE — Anesthesia Preprocedure Evaluation (Addendum)
Anesthesia Evaluation  Patient identified by MRN, date of birth, ID band Patient awake    Reviewed: Allergy & Precautions, NPO status , Patient's Chart, lab work & pertinent test results  History of Anesthesia Complications Negative for: history of anesthetic complications  Airway Mallampati: III  TM Distance: >3 FB Neck ROM: Full    Dental no notable dental hx. (+) Dental Advisory Given   Pulmonary former smoker,    Pulmonary exam normal        Cardiovascular Exercise Tolerance: Good hypertension, Pt. on medications and Pt. on home beta blockers Normal cardiovascular exam     Neuro/Psych negative neurological ROS  negative psych ROS   GI/Hepatic GERD  ,  Endo/Other  negative endocrine ROS  Renal/GU negative Renal ROS     Musculoskeletal negative musculoskeletal ROS (+)   Abdominal   Peds  Hematology T&S   Anesthesia Other Findings   Reproductive/Obstetrics negative OB ROS                            Anesthesia Physical  Anesthesia Plan  ASA: II  Anesthesia Plan: General   Post-op Pain Management:    Induction: Intravenous, Rapid sequence and Cricoid pressure planned  PONV Risk Score and Plan: 4 or greater and Treatment may vary due to age or medical condition, Ondansetron, Dexamethasone, Scopolamine patch - Pre-op and Midazolam  Airway Management Planned: Oral ETT  Additional Equipment: None  Intra-op Plan:   Post-operative Plan: Extubation in OR  Informed Consent: I have reviewed the patients History and Physical, chart, labs and discussed the procedure including the risks, benefits and alternatives for the proposed anesthesia with the patient or authorized representative who has indicated his/her understanding and acceptance.     Dental advisory given  Plan Discussed with: Anesthesiologist, CRNA and Surgeon  Anesthesia Plan Comments:        Anesthesia Quick  Evaluation

## 2019-08-03 NOTE — Transfer of Care (Signed)
Immediate Anesthesia Transfer of Care Note  Patient: Becky Koch  Procedure(s) Performed: LAPAROSCOPY DIAGNOSTIC (N/A Abdomen) Laparoscopic Lysis Of Adhesions (N/A Abdomen)  Patient Location: PACU  Anesthesia Type:General  Level of Consciousness: awake, alert  and oriented  Airway & Oxygen Therapy: Patient Spontanous Breathing and Patient connected to nasal cannula oxygen  Post-op Assessment: Report given to RN and Post -op Vital signs reviewed and stable  Post vital signs: Reviewed and stable  Last Vitals:  Vitals Value Taken Time  BP 138/88 08/03/19 1441  Temp 37 C 08/03/19 1441  Pulse 87 08/03/19 1449  Resp 17 08/03/19 1449  SpO2 92 % 08/03/19 1449  Vitals shown include unvalidated device data.  Last Pain:  Vitals:   08/03/19 1441  TempSrc:   PainSc: (P) Asleep      Patients Stated Pain Goal: 0 ([id number])  Complications: No apparent anesthesia complications

## 2019-08-03 NOTE — Progress Notes (Signed)
Subjective/Chief Complaint: Miserable appearing, abdomen still hurts, no flatus or stool   Objective: Vital signs in last 24 hours: Temp:  [98.4 F (36.9 C)-98.9 F (37.2 C)] 98.6 F (37 C) (12/14 0444) Pulse Rate:  [79-89] 80 (12/14 0444) Resp:  [14-18] 17 (12/14 0444) BP: (122-159)/(74-98) 141/98 (12/14 0444) SpO2:  [98 %-100 %] 100 % (12/14 0444) Last BM Date: 08/02/19  Intake/Output from previous day: 12/13 0701 - 12/14 0700 In: 1966.7 [I.V.:1766.7; NG/GT:200] Out: 2850 [Urine:1000; Emesis/NG output:1850] Intake/Output this shift: No intake/output data recorded.  Resp: clear to auscultation bilaterally Cardio: regular rate and rhythm GI: moderately tender throughout abdomen, few bs, mild distended  Lab Results:  Recent Labs    08/02/19 0626 08/03/19 0200  WBC 9.0 9.3  HGB 14.1 13.7  HCT 43.1 42.1  PLT 391 374   BMET Recent Labs    08/02/19 0626 08/03/19 0200  NA 141 142  K 3.9 4.2  CL 102 110  CO2 27 23  GLUCOSE 148* 97  BUN 9 6  CREATININE 0.90 0.76  CALCIUM 10.4* 9.0   PT/INR No results for input(s): LABPROT, INR in the last 72 hours. ABG No results for input(s): PHART, HCO3 in the last 72 hours.  Invalid input(s): PCO2, PO2  Studies/Results: CT ABDOMEN PELVIS W CONTRAST  Result Date: 08/02/2019 CLINICAL DATA:  Nausea, abdominal bloating. EXAM: CT ABDOMEN AND PELVIS WITH CONTRAST TECHNIQUE: Multidetector CT imaging of the abdomen and pelvis was performed using the standard protocol following bolus administration of intravenous contrast. CONTRAST:  152mL OMNIPAQUE IOHEXOL 300 MG/ML  SOLN COMPARISON:  May 01, 2019. FINDINGS: Lower chest: No acute abnormality. Hepatobiliary: No focal liver abnormality is seen. No gallstones, gallbladder wall thickening, or biliary dilatation. Pancreas: Unremarkable. No pancreatic ductal dilatation or surrounding inflammatory changes. Spleen: Normal in size without focal abnormality. Adrenals/Urinary Tract:  Adrenal glands are unremarkable. Kidneys are normal, without renal calculi, focal lesion, or hydronephrosis. Bladder is unremarkable. Stomach/Bowel: The stomach and appendix are unremarkable. No colonic dilatation is noted. However, there is significantly dilated and thickened small bowel loops in the pelvis with probable transition zone seen in the pelvis best seen on image number 70 of series 3. Vascular/Lymphatic: No significant vascular findings are present. No enlarged abdominal or pelvic lymph nodes. Reproductive: Status post hysterectomy. No adnexal masses. Other: No abdominal wall hernia or abnormality. No abdominopelvic ascites. Musculoskeletal: No acute or significant osseous findings. IMPRESSION: Dilated and thickened small bowel loops are noted in the pelvis with probable transition zone seen in the region of the ileum, resulting in distal small bowel obstruction. This is most likely due to adhesion. Electronically Signed   By: Marijo Conception M.D.   On: 08/02/2019 09:27   DG Abd Portable 1V-Small Bowel Obstruction Protocol-initial, 8 hr delay  Result Date: 08/03/2019 CLINICAL DATA:  Small-bowel obstruction EXAM: PORTABLE ABDOMEN - 1 VIEW COMPARISON:  August 02, 2019 at 3:01 p.m. FINDINGS: The enteric tube projects over the gastric body. There are dilated loops of small bowel scattered throughout the abdomen. The majority of the oral contrast resides within the small-bowel. There is questionable oral contrast at the level of the cecum. There is no pneumatosis or free air. IMPRESSION: 1. Persistent small bowel obstruction. 2. The majority of the oral contrast resides within the small bowel. Electronically Signed   By: Constance Holster M.D.   On: 08/03/2019 00:52   DG Abd Portable 1V-Small Bowel Protocol-Position Verification  Result Date: 08/02/2019 CLINICAL DATA:  NG tube placement. EXAM:  PORTABLE ABDOMEN - 1 VIEW COMPARISON:  CT scan dated 08/02/2019 FINDINGS: NG tube tip is in the distal  stomach. The visualized bowel gas pattern is normal. Bones are normal. No significant abnormality of the lung bases. IMPRESSION: Benign-appearing abdomen. Tube tip is in the distal stomach. Electronically Signed   By: Lorriane Shire M.D.   On: 08/02/2019 15:12    Anti-infectives: Anti-infectives (From admission, onward)   None      Assessment/Plan: SBO S/p TVH/BSO 12/8 -I am concerned about her exam today as well as imaging despite normal vitals and normal wbc -I dont have great reason that she has bowel obstruction, only has prior btl before this. I think her exam merits a laparoscopic exploration today.  I discussed dx lsc with patient with possible open operation and even small bowel resection. Risks discussed.  Will proceed today  [name] 08/03/2019

## 2019-08-03 NOTE — Progress Notes (Signed)
Abdominal xray was done

## 2019-08-03 NOTE — Progress Notes (Signed)
Patient transported off unit to OR for procedure.

## 2019-08-03 NOTE — Discharge Summary (Signed)
Physician Discharge Summary  Patient ID: Becky Koch MRN: [id number] DOB/AGE: 1971/03/07 48 y.o.  Admit date: 07/28/2019 Discharge date: 08/03/2019  Admission Diagnoses: DUB, fibroids  Discharge Diagnoses:  Active Problems:   DUB (dysfunctional uterine bleeding)   Post-operative state   Discharged Condition: good  Hospital Course: She underwent an uncomplicated TVH/BS at [id number]. By the evening she was voiding, tolerating po well, having flatus. She voiced her readiness to go home.  Consults: None  Treatments: surgery: TVH/BS  Discharge Exam: Blood pressure 118/79, pulse 84, temperature (!) 97.5 F (36.4 C), resp. rate 16, height 5\' 6"  (1.676 m), weight 74.8 kg, last menstrual period 07/24/2019, SpO2 100 %. General appearance: alert Resp: clear to auscultation bilaterally Cardio: regular rate and rhythm, S1, S2 normal, no murmur, click, rub or gallop GI: soft, non-tender; bowel sounds normal; no masses,  no organomegaly  Disposition: home   Allergies as of 07/28/2019   No Known Allergies     Medication List    STOP taking these medications   megestrol 40 MG tablet Commonly known as: MEGACE   naproxen 500 MG tablet Commonly known as: Naprosyn   sulfamethoxazole-trimethoprim 800-160 MG tablet Commonly known as: BACTRIM DS     TAKE these medications   amLODipine 10 MG tablet Commonly known as: NORVASC TAKE 1 TABLET (10 MG TOTAL) BY MOUTH DAILY.   cetirizine 10 MG tablet Commonly known as: ZYRTEC Take 1 tablet (10 mg total) by mouth daily. What changed:   when to take this  reasons to take this   estrogens (conjugated) 0.625 MG tablet Commonly known as: Premarin Take 1 tablet (0.625 mg total) by mouth daily. Take daily for 21 days then do not take for 7 days.   hydrochlorothiazide 25 MG tablet Commonly known as: HYDRODIURIL TAKE 1 TABLET(25 MG) BY MOUTH DAILY What changed: See the new instructions.   hydrocortisone 2.5 % ointment Apply topically 2  (two) times daily. What changed: how much to take   ibuprofen 600 MG tablet Commonly known as: ADVIL Take 1 tablet (600 mg total) by mouth every 6 (six) hours as needed. What changed:   medication strength  how much to take  when to take this  reasons to take this   Metoprolol Succinate 25 MG Cs24 Take 25 mg by mouth daily.   omeprazole 20 MG capsule Commonly known as: PRILOSEC TAKE 1 CAPSULE BY MOUTH DAILY.   oxyCODONE-acetaminophen 5-325 MG tablet Commonly known as: PERCOCET/ROXICET Take 1 tablet by mouth every 6 (six) hours as needed.   traZODone 50 MG tablet Commonly known as: DESYREL Take 0.5-1 tablets (25-50 mg total) by mouth at bedtime as needed for sleep. What changed: how much to take      Follow-up Information    Emily Filbert, MD. Schedule an appointment as soon as possible for a visit in 6 weeks.   Specialty: Obstetrics and Gynecology Contact information: [place] [address] [phone]        Emily Filbert, MD. Schedule an appointment as soon as possible for a visit in 6 weeks.   Specialty: Obstetrics and Gynecology Contact information: [address] [place] [address] [phone]           Signed: Emily Filbert 08/03/2019, 8:38 AM

## 2019-08-04 ENCOUNTER — Other Ambulatory Visit: Payer: Self-pay

## 2019-08-04 LAB — BASIC METABOLIC PANEL
Anion gap: 10 (ref 5–15)
BUN: 6 mg/dL (ref 6–20)
CO2: 21 mmol/L — ABNORMAL LOW (ref 22–32)
Calcium: 9.2 mg/dL (ref 8.9–10.3)
Chloride: 107 mmol/L (ref 98–111)
Creatinine, Ser: 0.73 mg/dL (ref 0.44–1.00)
GFR calc Af Amer: >60 mL/min (ref >60)
GFR calc non Af Amer: >60 mL/min (ref >60)
Glucose, Bld: 101 mg/dL — ABNORMAL HIGH (ref 70–99)
Potassium: 4.3 mmol/L (ref 3.5–5.1)
Sodium: 138 mmol/L (ref 135–145)

## 2019-08-04 LAB — CBC
HCT: 39.2 % (ref 36.0–46.0)
Hemoglobin: 13.2 g/dL (ref 12.0–15.0)
MCH: 29.8 pg (ref 26.0–34.0)
MCHC: 33.7 g/dL (ref 30.0–36.0)
MCV: 88.5 fL (ref 80.0–100.0)
Platelets: 350 10*3/uL (ref 150–400)
RBC: 4.43 MIL/uL (ref 3.87–5.11)
RDW: 12.3 % (ref 11.5–15.5)
WBC: 8.9 10*3/uL (ref 4.0–10.5)
nRBC: 0 % (ref 0.0–0.2)

## 2019-08-04 MED ORDER — METOPROLOL SUCCINATE ER 25 MG PO TB24
25.0000 mg | ORAL_TABLET | Freq: Every day | ORAL | Status: DC
  Administered 2019-08-04 – 2019-08-07 (×4): 25 mg via ORAL
  Filled 2019-08-04 (×4): qty 1

## 2019-08-04 MED ORDER — PHENOL 1.4 % MT LIQD
1.0000 | OROMUCOSAL | Status: DC | PRN
  Administered 2019-08-04: 1 via OROMUCOSAL
  Filled 2019-08-04: qty 177

## 2019-08-04 MED ORDER — AMLODIPINE BESYLATE 10 MG PO TABS
10.0000 mg | ORAL_TABLET | Freq: Every day | ORAL | Status: DC
  Administered 2019-08-04 – 2019-08-07 (×4): 10 mg via ORAL
  Filled 2019-08-04 (×4): qty 1

## 2019-08-04 MED ORDER — HYDROCHLOROTHIAZIDE 25 MG PO TABS
25.0000 mg | ORAL_TABLET | Freq: Every day | ORAL | Status: DC
  Administered 2019-08-04 – 2019-08-07 (×4): 25 mg via ORAL
  Filled 2019-08-04 (×4): qty 1

## 2019-08-04 MED ORDER — KCL IN DEXTROSE-NACL 20-5-0.45 MEQ/L-%-% IV SOLN
INTRAVENOUS | Status: DC
  Filled 2019-08-04 (×5): qty 1000

## 2019-08-04 NOTE — Plan of Care (Signed)

## 2019-08-04 NOTE — Plan of Care (Signed)
  Problem: Pain Managment: Goal: General experience of comfort will improve Outcome: Progressing   Problem: Safety: Goal: Ability to remain free from injury will improve Outcome: Progressing   Problem: Skin Integrity: Goal: Risk for impaired skin integrity will decrease Outcome: Progressing   

## 2019-08-04 NOTE — Progress Notes (Signed)
1 Day Post-Op Procedure(s) (LRB): LAPAROSCOPY DIAGNOSTIC (N/A) Laparoscopic Lysis Of Adhesions (N/A)  Subjective: Patient reports that she feels much better already. No flatus yet.  Objective: I have reviewed patient's vital signs, intake and output, medications and labs.  General: alert Resp: clear to auscultation bilaterally Cardio: regular rate and rhythm, S1, S2 normal, no murmur, click, rub or gallop GI: soft, non-tender; bowel sounds normal; no masses,  no organomegaly  Much softer than yesterday  Assessment: s/p Procedure(s): LAPAROSCOPY DIAGNOSTIC (N/A) Laparoscopic Lysis Of Adhesions (N/A): progressing well  Plan: Encourage ambulation  LOS: 2 days    Becky Koch 08/04/2019, 3:42 PM

## 2019-08-04 NOTE — Progress Notes (Signed)
1 Day Post-Op   Subjective/Chief Complaint: Feels much better, no flatus or bm yet but much softer   Objective: Vital signs in last 24 hours: Temp:  [97.5 F (36.4 C)-99 F (37.2 C)] 98.9 F (37.2 C) (12/15 0522) Pulse Rate:  [79-98] 79 (12/15 0522) Resp:  [16-20] 20 (12/15 0522) BP: (120-165)/(88-98) 164/94 (12/15 0522) SpO2:  [91 %-99 %] 97 % (12/15 0522) Weight:  [74.4 kg] 74.4 kg (12/14 1239) Last BM Date: 08/02/19  Intake/Output from previous day: 12/14 0701 - 12/15 0700 In: 3253 [I.V.:2973; NG/GT:30; IV Piggyback:250] Out: 3785 [Urine:3075; Emesis/NG output:700; Blood:10] Intake/Output this shift: No intake/output data recorded.  Resp: clear to auscultation bilaterally Cardio: regular rate and rhythm GI: soft approp tender incisions clean  Lab Results:  Recent Labs    08/03/19 0200 08/04/19 0404  WBC 9.3 8.9  HGB 13.7 13.2  HCT 42.1 39.2  PLT 374 350   BMET Recent Labs    08/03/19 0200 08/04/19 0404  NA 142 138  K 4.2 4.3  CL 110 107  CO2 23 21*  GLUCOSE 97 101*  BUN 6 6  CREATININE 0.76 0.73  CALCIUM 9.0 9.2   PT/INR No results for input(s): LABPROT, INR in the last 72 hours. ABG No results for input(s): PHART, HCO3 in the last 72 hours.  Invalid input(s): PCO2, PO2  Studies/Results: DG Abd Portable 1V-Small Bowel Obstruction Protocol-initial, 8 hr delay  Result Date: 08/03/2019 CLINICAL DATA:  Small-bowel obstruction EXAM: PORTABLE ABDOMEN - 1 VIEW COMPARISON:  August 02, 2019 at 3:01 p.m. FINDINGS: The enteric tube projects over the gastric body. There are dilated loops of small bowel scattered throughout the abdomen. The majority of the oral contrast resides within the small-bowel. There is questionable oral contrast at the level of the cecum. There is no pneumatosis or free air. IMPRESSION: 1. Persistent small bowel obstruction. 2. The majority of the oral contrast resides within the small bowel. Electronically Signed   By: Constance Holster M.D.   On: 08/03/2019 00:52   DG Abd Portable 1V-Small Bowel Protocol-Position Verification  Result Date: 08/02/2019 CLINICAL DATA:  NG tube placement. EXAM: PORTABLE ABDOMEN - 1 VIEW COMPARISON:  CT scan dated 08/02/2019 FINDINGS: NG tube tip is in the distal stomach. The visualized bowel gas pattern is normal. Bones are normal. No significant abnormality of the lung bases. IMPRESSION: Benign-appearing abdomen. Tube tip is in the distal stomach. Electronically Signed   By: Lorriane Shire M.D.   On: 08/02/2019 15:12    Anti-infectives: Anti-infectives (From admission, onward)   None      Assessment/Plan: POD 1 lap LOA s/p TVH 12/8[name] -will continue ng tube, expect ileus given small bowel inflammation- small risk could still have issue with that -will give home meds and can clamp tube -pulm toilet -lovenox, scds  Becky Koch 08/04/2019

## 2019-08-05 MED ORDER — TRAZODONE HCL 50 MG PO TABS
25.0000 mg | ORAL_TABLET | Freq: Every evening | ORAL | Status: DC | PRN
  Administered 2019-08-06: 50 mg via ORAL
  Filled 2019-08-05: qty 1

## 2019-08-05 NOTE — Progress Notes (Signed)
2 Days Post-Op Procedure(s) (LRB): LAPAROSCOPY DIAGNOSTIC (N/A) Laparoscopic Lysis Of Adhesions (N/A)  Subjective: Patient reports tolerating PO liquids, ambulating, voiding, and having flatus    Objective: I have reviewed patient's vital signs, intake and output, medications and labs.  General: alert Resp: clear to auscultation bilaterally Cardio: regular rate and rhythm, S1, S2 normal, no murmur, click, rub or gallop GI: soft, non-tender; bowel sounds normal; no masses,  no organomegaly Tympany still present but less than yesterday, essentially non tender with palpation. Assessment: s/p Procedure(s): LAPAROSCOPY DIAGNOSTIC (N/A) Laparoscopic Lysis Of Adhesions (N/A): progressing well  Plan: Encourage ambulation   I have reordered her trazadone for insomnia  LOS: 3 days    Becky Koch C Becky Koch 08/05/2019, 4:52 PM

## 2019-08-05 NOTE — Progress Notes (Signed)
2 Days Post-Op    CC: Abdominal pain  Subjective: No flatus or BM.  Her NG is functional but it had so many extensions it was not working.  Her abdomen is still fairly tender. Port sites all look good. Objective: Vital signs in last 24 hours: Temp:  [98.7 F (37.1 C)-99.1 F (37.3 C)] 98.7 F (37.1 C) (12/16 0434) Pulse Rate:  [76-79] 78 (12/16 0434) Resp:  [15-19] 19 (12/16 0434) BP: (140-147)/(94-112) 146/112 (12/16 0434) SpO2:  [97 %-99 %] 97 % (12/16 0434) Last BM Date: 08/02/19 120 p.o. 1341 IV 700 urine 525 NG Afebrile vital signs are stable blood pressures on the high side. BMP is stable.  CBC is stable  Intake/Output from previous day: 12/15 0701 - 12/16 0700 In: 1461.3 [P.O.:120; I.V.:1341.3] Out: 1275 [Urine:700; Emesis/NG output:575] Intake/Output this shift: No intake/output data recorded.  General appearance: alert, cooperative and no distress Resp: clear to auscultation bilaterally GI: Soft she is still pretty tender.  Her port sites look fine.  No bowel sounds no BM or flatus so far.  Lab Results:  Recent Labs    08/03/19 0200 08/04/19 0404  WBC 9.3 8.9  HGB 13.7 13.2  HCT 42.1 39.2  PLT 374 350    BMET Recent Labs    08/03/19 0200 08/04/19 0404  NA 142 138  K 4.2 4.3  CL 110 107  CO2 23 21*  GLUCOSE 97 101*  BUN 6 6  CREATININE 0.76 0.73  CALCIUM 9.0 9.2   PT/INR No results for input(s): LABPROT, INR in the last 72 hours.  Recent Labs  Lab 08/02/19 0626  AST 38  ALT 36  ALKPHOS 75  BILITOT 0.4  PROT 7.7  ALBUMIN 3.7     Lipase     Component Value Date/Time   LIPASE 25 08/02/2019 0626     Medications: . amLODipine  10 mg Oral Daily  . Chlorhexidine Gluconate Cloth  6 each Topical Daily  . enoxaparin (LOVENOX) injection  40 mg Subcutaneous Q24H  . hydrochlorothiazide  25 mg Oral Daily  . metoprolol succinate  25 mg Oral Daily   Antibiotics Given (last 72 hours)    None       Assessment/Plan Hypertension Uterine fibroids, menorrhagia, dyspareunia and pelvic pain GERD History of tobacco use  SBO S/p transvaginal hysterectomy/salpingo-oophorectomy 07/28/2019 Dr. Clovia Cuff S/p diagnostic laparoscopy with lysis of adhesions 08/03/2019 Dr. [name], POD #2 FEN:  NPO x ice chips/IV fluids ID:  None DVT: Lovenox Follow up:  TBD  Plan: I reduce the NG extensions down to 1 its working properly.  We will clamp the NG and see how she does with a clamp.  Sips and chips and meds with sips today.  Mobilize get her walking in the halls.  Recheck labs in a.m.       LOS: 3 days    Kyanne Rials 08/05/2019 Please see Amion

## 2019-08-06 LAB — BASIC METABOLIC PANEL
Anion gap: 13 (ref 5–15)
BUN: 9 mg/dL (ref 6–20)
CO2: 22 mmol/L (ref 22–32)
Calcium: 9.9 mg/dL (ref 8.9–10.3)
Chloride: 103 mmol/L (ref 98–111)
Creatinine, Ser: 0.93 mg/dL (ref 0.44–1.00)
GFR calc Af Amer: >60 mL/min (ref >60)
GFR calc non Af Amer: >60 mL/min (ref >60)
Glucose, Bld: 134 mg/dL — ABNORMAL HIGH (ref 70–99)
Potassium: 3.6 mmol/L (ref 3.5–5.1)
Sodium: 138 mmol/L (ref 135–145)

## 2019-08-06 LAB — CBC
HCT: 43.1 % (ref 36.0–46.0)
Hemoglobin: 14.3 g/dL (ref 12.0–15.0)
MCH: 29.5 pg (ref 26.0–34.0)
MCHC: 33.2 g/dL (ref 30.0–36.0)
MCV: 89 fL (ref 80.0–100.0)
Platelets: 383 10*3/uL (ref 150–400)
RBC: 4.84 MIL/uL (ref 3.87–5.11)
RDW: 12.4 % (ref 11.5–15.5)
WBC: 7.9 10*3/uL (ref 4.0–10.5)
nRBC: 0 % (ref 0.0–0.2)

## 2019-08-06 NOTE — Progress Notes (Signed)
3 Days Post-Op   Subjective/Chief Complaint: Having flatus, up and around   Objective: Vital signs in last 24 hours: Temp:  [98.6 F (37 C)-99.5 F (37.5 C)] 98.6 F (37 C) (12/17 0536) Pulse Rate:  [83-92] 83 (12/17 0536) Resp:  [18-19] 19 (12/16 2112) BP: (123-130)/(80-103) 127/91 (12/17 0536) SpO2:  [97 %-100 %] 100 % (12/17 0536) Last BM Date: 08/02/19  Intake/Output from previous day: 12/16 0701 - 12/17 0700 In: 840 [I.V.:840] Out: 2450 [Urine:950; Emesis/NG output:1500] Intake/Output this shift: No intake/output data recorded.  General appearance: no distress GI: soft approp tender incisions clean  Lab Results:  Recent Labs    08/04/19 0404 08/06/19 0153  WBC 8.9 7.9  HGB 13.2 14.3  HCT 39.2 43.1  PLT 350 383   BMET Recent Labs    08/04/19 0404 08/06/19 0153  NA 138 138  K 4.3 3.6  CL 107 103  CO2 21* 22  GLUCOSE 101* 134*  BUN 6 9  CREATININE 0.73 0.93  CALCIUM 9.2 9.9   PT/INR No results for input(s): LABPROT, INR in the last 72 hours. ABG No results for input(s): PHART, HCO3 in the last 72 hours.  Invalid input(s): PCO2, PO2  Studies/Results: No results found.  Anti-infectives: Anti-infectives (From admission, onward)   None      Assessment/Plan: POD 3 lap LOA s/p TVH 12/8- Sire Poet -dc ng tube, clears,advance to fulls as tolerated -hopefully home in am -pulm toilet -lovenox, scds  [name] 08/06/2019

## 2019-08-06 NOTE — Discharge Instructions (Signed)
[facility] LAPAROSCOPIC SURGERY: POST OP INSTRUCTIONS  Always review your discharge instruction sheet given to you by the facility where your surgery was performed. IF YOU HAVE DISABILITY OR FAMILY LEAVE FORMS, YOU MUST BRING THEM TO THE OFFICE FOR PROCESSING.   DO NOT GIVE THEM TO YOUR DOCTOR.  1. A prescription for pain medication may be given to you upon discharge.  Take your pain medication as prescribed, if needed.  If narcotic pain medicine is not needed, then you may take acetaminophen (Tylenol), naprosyn (Alleve), or ibuprofen (Advil) as needed. 2. Take your usually prescribed medications unless otherwise directed. 3. If you need a refill on your pain medication, please contact your pharmacy.  They will contact our office to request authorization. Prescriptions will not be filled after 5pm or on week-ends. 4. You should follow a light diet the first few days after arrival home, such as soup and crackers, etc.  Be sure to include lots of fluids daily. 5. Most patients will experience some swelling and bruising in the area of the incisions.  Ice packs will help.  Swelling and bruising can take several days to resolve.  6. It is common to experience some constipation if taking pain medication after surgery.  Increasing fluid intake and taking a stool softener (such as Colace) will usually help or prevent this problem from occurring.  A mild laxative (Milk of Magnesia or Miralax) should be taken according to package instructions if there are no bowel movements after 48 hours. 7. Unless discharge instructions indicate otherwise, you may remove your bandages 48 hours after surgery, and you may shower at that time.  You may have steri-strips (small skin tapes) in place directly over the incision.  These strips should be left on the skin for 7-10 days.  If your surgeon used skin glue on the incision, you may shower in 24 hours.  The glue will flake  off over the next 2-3 weeks.  Any sutures or staples will be removed at the office during your follow-up visit. 8. ACTIVITIES:  You may resume regular (light) daily activities beginning the next day--such as daily self-care, walking, climbing stairs--gradually increasing activities as tolerated.  You may have sexual intercourse when it is comfortable.  Refrain from any heavy lifting or straining until approved by your doctor. a. You may drive when you are no longer taking prescription pain medication, you can comfortably wear a seatbelt, and you can safely maneuver your car and apply brakes. b. RETURN TO WORK:  __________________________________________________________ 9. You should see your doctor in the office for a follow-up appointment approximately 2-3 weeks after your surgery.  Make sure that you call for this appointment within a day or two after you arrive home to insure a convenient appointment time. 10. OTHER INSTRUCTIONS: __________________________________________________________________________________________________________________________ __________________________________________________________________________________________________________________________ WHEN TO CALL YOUR DOCTOR: 1. Fever over 101.0 2. Inability to urinate 3. Continued bleeding from incision. 4. Increased pain, redness, or drainage from the incision. 5. Increasing abdominal pain  The clinic staff is available to answer your questions during regular business hours.  Please don't hesitate to call and ask to speak to one of the nurses for clinical concerns.  If you have a medical emergency, go to the nearest emergency room or call 911.  A surgeon from [facility] is always on call at the hospital. [address], [address], [place], [place]  [address] ? [address] [address], [place], [place]   [address] [phone] ? [phone] ? [phone] Web site: [email or web address]  

## 2019-08-06 NOTE — Plan of Care (Signed)
  Problem: Pain Managment: Goal: General experience of comfort will improve Outcome: Progressing   Problem: Safety: Goal: Ability to remain free from injury will improve Outcome: Progressing   Problem: Skin Integrity: Goal: Risk for impaired skin integrity will decrease Outcome: Progressing   

## 2019-08-07 MED ORDER — OXYCODONE HCL 5 MG PO TABS: 5.0000 mg | ORAL_TABLET | Freq: Four times a day (QID) | ORAL | 0 refills | Status: DC | PRN

## 2019-08-07 MED ORDER — POLYETHYLENE GLYCOL 3350 17 G PO PACK: 17.0000 g | PACK | Freq: Every day | ORAL | 0 refills | Status: DC | PRN

## 2019-08-07 MED FILL — oxyCODONE HCL 5 MG TABS: 5 | 4 days supply | Qty: 15 | Fill #0

## 2019-08-07 NOTE — Progress Notes (Signed)
Becky Koch to be D/C'd  per MD order. Discussed with the patient and all questions fully answered.  VSS, Skin clean, dry and intact without evidence of skin break down, no evidence of skin tears noted.  IV catheter discontinued intact. Site without signs and symptoms of complications. Dressing and pressure applied.  An After Visit Summary was printed and given to the patient. Patient received prescription.  D/c education completed with patient/family including follow up instructions, medication list, d/c activities limitations if indicated, with other d/c instructions as indicated by MD - patient able to verbalize understanding, all questions fully answered.   Patient instructed to return to ED, call 911, or call MD for any changes in condition.   Patient to be escorted via [place], and D/C home via private auto.

## 2019-08-07 NOTE — Discharge Summary (Signed)
[facility] Discharge Summary   Patient ID: Becky Koch MRN: [id number] DOB/AGE: [birth year] 50 y.o.  Admit date: 08/02/2019 Discharge date: 08/07/2019  Admitting Diagnosis: SBO  Discharge Diagnosis Patient Active Problem List   Diagnosis Date Noted  . SBO (small bowel obstruction) ([place]) 08/02/2019  . DUB (dysfunctional uterine bleeding) 07/28/2019  . Post-operative state 07/28/2019  . Gastroesophageal reflux disease with esophagitis 12/05/2018  . Tobacco use 06/23/2018  . Actinic keratoses 05/02/2017  . Dysmenorrhea 07/14/2012  . Menorrhagia 07/14/2012  . History of uterine fibroid 07/14/2012  . HTN (hypertension) 07/14/2012    Consultants OB/GYN  Imaging: No results found.  Procedures Dr. Donne Hazel (08/03/19) - Laparoscopic lysis of adhesions Hospital Course:  Becky Koch is a 48yo female s/p vaginal hysterectomy 07/28/19 who [facility] to [facility] 12[facility]13 with abdominal pain, nausea, and vomiting.  Workup showed small bowel obstruction.  Patient was admitted to the surgical service, NG tube placed for decompression. Patient did not improve with conservative management therefore was taken to the operating room 12/14 for the above listed procedure. Intraoperatively she was found to have an area near where she had her right ovary removed where there was a suture that had adhered to the bowel. Tolerated procedure well and was transferred to the floor.  She did have an ileus postoperatively but this improved with time and diet was advanced as tolerated.  On POD4 the patient was voiding well, tolerating diet, ambulating well, pain well controlled, vital signs stable, incisions c/d/i and felt stable for discharge home.  Patient will follow up as below and knows to call with questions or concerns.    I have personally reviewed the patients medication history on the [place] controlled substance database.    Physical Exam: General:  Alert, NAD, pleasant, comfortable Pulm: rate  and effort normal Abd:  Soft, ND, NT, +BS, multiple lap incisions C/D/I  Allergies as of 08/07/2019   No Known Allergies     Medication List    STOP taking these medications   oxyCODONE-acetaminophen 5-325 MG tablet Commonly known as: PERCOCET/ROXICET     TAKE these medications   alum & mag hydroxide-simeth 200-200-20 MG/5ML suspension Commonly known as: MAALOX/MYLANTA Take 15 mLs by mouth every 6 (six) hours as needed for indigestion or heartburn.   amLODipine 10 MG tablet Commonly known as: NORVASC TAKE 1 TABLET (10 MG TOTAL) BY MOUTH DAILY.   cetirizine 10 MG tablet Commonly known as: ZYRTEC Take 1 tablet (10 mg total) by mouth daily. What changed:   when to take this  reasons to take this   docusate sodium 100 MG capsule Commonly known as: COLACE Take 100 mg by mouth 2 (two) times daily.   hydrochlorothiazide 25 MG tablet Commonly known as: HYDRODIURIL TAKE 1 TABLET(25 MG) BY MOUTH DAILY What changed: See the new instructions.   hydrocortisone 2.5 % ointment Apply topically 2 (two) times daily. What changed: how much to take   ibuprofen 600 MG tablet Commonly known as: ADVIL Take 1 tablet (600 mg total) by mouth every 6 (six) hours as needed.   Metoprolol Succinate 25 MG Cs24 Take 25 mg by mouth daily.   omeprazole 20 MG capsule Commonly known as: PRILOSEC TAKE 1 CAPSULE BY MOUTH DAILY.   oxyCODONE 5 MG immediate release tablet Commonly known as: Oxy IR/ROXICODONE Take 1 tablet (5 mg total) by mouth every 6 (six) hours as needed for severe pain.   polyethylene glycol 17 g packet Commonly known as: MIRALAX / GLYCOLAX Take 17  g by mouth daily as needed for mild constipation.   traZODone 50 MG tablet Commonly known as: DESYREL Take 0.5-1 tablets (25-50 mg total) by mouth at bedtime as needed for sleep. What changed: how much to take        Follow-up Information    [name], MD Follow up in 3 week(s).   Specialty: General  Surgery Contact information: [place] [address] [phone]        Emily Filbert, MD. Call.   Specialty: Obstetrics and Gynecology Why: call to arrange follow up Contact information: [address] [place] [address] [phone]           Signed: Wellington Hampshire, [facility] Surgery 08/07/2019, 8:34 AM Please see Amion for pager number during day hours 7:00am-4:30pm

## 2019-08-19 ENCOUNTER — Other Ambulatory Visit: Payer: Self-pay | Admitting: Obstetrics & Gynecology

## 2019-08-19 MED ORDER — TRAMADOL HCL 50 MG PO TABS: 50.0000 mg | ORAL_TABLET | Freq: Four times a day (QID) | ORAL | 0 refills | Status: DC | PRN

## 2019-08-19 MED ORDER — ESTROGENS CONJUGATED 0.45 MG PO TABS: 0.4500 mg | ORAL_TABLET | Freq: Every day | ORAL | 6 refills | Status: DC

## 2019-08-19 MED ORDER — OXYCODONE-ACETAMINOPHEN 5-325 MG PO TABS: 1.0000 | ORAL_TABLET | Freq: Four times a day (QID) | ORAL | 0 refills | Status: DC | PRN

## 2019-08-19 MED FILL — traMADol HCL 50 MG TABS: 50 | 10 days supply | Qty: 40 | Fill #0

## 2019-08-19 NOTE — Progress Notes (Signed)
Per patient request, I have prescribed premarin for her hot flashes and tramadol because she is almost out of percocet and is still having some pain.

## 2019-08-26 MED FILL — ?AMLODIPINE BESYLATE 10 MG: 10 | 30 days supply | Qty: 30 | Fill #2

## 2019-08-26 MED FILL — METOPROLOL SUCCINATE ER 25: 25 | 30 days supply | Qty: 30 | Fill #4

## 2019-08-26 MED FILL — ?OMEPRAZOLE 20MG CAP DR: 20 | 30 days supply | Qty: 30 | Fill #2

## 2019-08-26 MED FILL — ?HYDROCHLOROTHIAZIDE 25MG T: 25 | 30 days supply | Qty: 30 | Fill #2

## 2019-08-28 MED FILL — traMADol HCL 50 MG TABS: 50 | 10 days supply | Qty: 40 | Fill #0

## 2019-09-02 ENCOUNTER — Other Ambulatory Visit: Payer: Self-pay | Admitting: Obstetrics & Gynecology

## 2019-09-02 MED ORDER — ESTRADIOL 2 MG PO TABS: 2.0000 mg | ORAL_TABLET | Freq: Every day | ORAL | 6 refills | Status: DC

## 2019-09-02 MED FILL — ESTRADIOL 2 MG TABLET: 2 | 30 days supply | Qty: 30 | Fill #0

## 2019-09-02 NOTE — Progress Notes (Signed)
Estrace oral prescribed due to the prohibitive cost of premarin per patient.

## 2019-09-15 ENCOUNTER — Ambulatory Visit (INDEPENDENT_AMBULATORY_CARE_PROVIDER_SITE_OTHER): Payer: Self-pay | Admitting: Obstetrics & Gynecology

## 2019-09-15 ENCOUNTER — Other Ambulatory Visit: Payer: Self-pay

## 2019-09-15 VITALS — BP 133/92 | HR 97 | Wt 160.7 lb

## 2019-09-15 DIAGNOSIS — Z9889 Other specified postprocedural states: Secondary | ICD-10-CM

## 2019-09-15 NOTE — Progress Notes (Signed)
   Subjective:    Patient ID: Becky Koch, female    DOB: [birth year], 49 y.o.   MRN: [id number]  HPI 49 yo married P4 here for a post op visit. She had a TVH/BSO on 07-28-19. Initially she did well but then developed a small bowel obstruction and then had a laparoscopy and reduction of SBO. She has done very well since then. She has been sexually active, able to have orgasm. She reports normal bowel and bladder function. She is taking oral estrace.  Review of Systems Pathology benign    Objective:   Physical Exam Breathing, conversing, and ambulating normally Well nourished, well hydrated Black female, no apparent distress Abd- benign, laparoscopy incisions healed great Cuff- healed great Bimanual exam normal    Assessment & Plan:  Post op state- doing well

## 2019-09-24 ENCOUNTER — Encounter: Payer: Self-pay | Admitting: Obstetrics and Gynecology

## 2019-09-28 ENCOUNTER — Other Ambulatory Visit: Payer: Self-pay | Admitting: Family Medicine

## 2019-09-28 DIAGNOSIS — I10 Essential (primary) hypertension: Secondary | ICD-10-CM

## 2019-09-28 MED FILL — METOPROLOL SUCCINATE ER 25: 25 | 30 days supply | Qty: 30 | Fill #5

## 2019-09-28 MED FILL — ?OMEPRAZOLE 20MG CAP DR: 20 | 30 days supply | Qty: 30 | Fill #3

## 2019-09-28 MED FILL — ESTRADIOL 2 MG TABLET: 2 | 30 days supply | Qty: 30 | Fill #1

## 2019-09-29 ENCOUNTER — Encounter: Payer: Self-pay | Admitting: Obstetrics and Gynecology

## 2019-09-29 MED FILL — AMLODIPINE BESYLATE 10 MG T: 10 | 30 days supply | Qty: 30 | Fill #0

## 2019-09-29 MED FILL — ?HYDROCHLOROTHIAZIDE 25MG T: 25 | 30 days supply | Qty: 30 | Fill #0

## 2019-11-03 ENCOUNTER — Other Ambulatory Visit: Payer: Self-pay | Admitting: Family Medicine

## 2019-11-03 MED FILL — ?HYDROCHLOROTHIAZIDE 25MG T: 25 | 30 days supply | Qty: 30 | Fill #1

## 2019-11-03 MED FILL — ESTRADIOL 2 MG TABLET: 2 | 30 days supply | Qty: 30 | Fill #2

## 2019-11-03 MED FILL — AMLODIPINE BESYLATE 10 MG T: 10 | 30 days supply | Qty: 30 | Fill #1

## 2019-11-04 MED FILL — OMEPRAZOLE 20 MG CAP: 20 | 30 days supply | Qty: 30 | Fill #0

## 2019-12-08 ENCOUNTER — Other Ambulatory Visit: Payer: Self-pay | Admitting: Family Medicine

## 2019-12-08 MED FILL — HYDROCHLOROTHIAZIDE 25 MG T: 25 | 30 days supply | Qty: 30 | Fill #2

## 2019-12-08 MED FILL — AMLODIPINE BESYLATE 10 MG T: 10 | 30 days supply | Qty: 30 | Fill #2

## 2019-12-08 MED FILL — ESTRADIOL 2 MG TABLET: 2 | 30 days supply | Qty: 30 | Fill #3

## 2019-12-22 ENCOUNTER — Emergency Department (HOSPITAL_COMMUNITY): Payer: Self-pay

## 2019-12-22 ENCOUNTER — Other Ambulatory Visit: Payer: Self-pay

## 2019-12-22 ENCOUNTER — Emergency Department (HOSPITAL_BASED_OUTPATIENT_CLINIC_OR_DEPARTMENT_OTHER)
Admit: 2019-12-22 | Discharge: 2019-12-22 | Disposition: A | Discharge status: Home / Self Care | Payer: Self-pay | Attending: Emergency Medicine | Admitting: Emergency Medicine

## 2019-12-22 ENCOUNTER — Emergency Department (HOSPITAL_COMMUNITY)
Admission: EM | Admit: 2019-12-22 | Discharge: 2019-12-22 | Disposition: A | Discharge status: Home / Self Care | Payer: Self-pay | Attending: Emergency Medicine | Admitting: Emergency Medicine

## 2019-12-22 ENCOUNTER — Encounter (HOSPITAL_COMMUNITY): Payer: Self-pay | Admitting: Emergency Medicine

## 2019-12-22 DIAGNOSIS — I10 Essential (primary) hypertension: Secondary | ICD-10-CM | POA: Insufficient documentation

## 2019-12-22 DIAGNOSIS — R2242 Localized swelling, mass and lump, left lower limb: Secondary | ICD-10-CM | POA: Insufficient documentation

## 2019-12-22 DIAGNOSIS — R61 Generalized hyperhidrosis: Secondary | ICD-10-CM | POA: Insufficient documentation

## 2019-12-22 DIAGNOSIS — E876 Hypokalemia: Secondary | ICD-10-CM | POA: Insufficient documentation

## 2019-12-22 DIAGNOSIS — M7989 Other specified soft tissue disorders: Secondary | ICD-10-CM

## 2019-12-22 DIAGNOSIS — R0602 Shortness of breath: Secondary | ICD-10-CM | POA: Insufficient documentation

## 2019-12-22 DIAGNOSIS — R112 Nausea with vomiting, unspecified: Secondary | ICD-10-CM | POA: Insufficient documentation

## 2019-12-22 DIAGNOSIS — Z79899 Other long term (current) drug therapy: Secondary | ICD-10-CM | POA: Insufficient documentation

## 2019-12-22 DIAGNOSIS — R000 Tachycardia, unspecified: Secondary | ICD-10-CM | POA: Insufficient documentation

## 2019-12-22 DIAGNOSIS — R002 Palpitations: Secondary | ICD-10-CM | POA: Insufficient documentation

## 2019-12-22 DIAGNOSIS — R0789 Other chest pain: Secondary | ICD-10-CM | POA: Insufficient documentation

## 2019-12-22 DIAGNOSIS — F17290 Nicotine dependence, other tobacco product, uncomplicated: Secondary | ICD-10-CM | POA: Insufficient documentation

## 2019-12-22 LAB — BASIC METABOLIC PANEL
Anion gap: 13 (ref 5–15)
BUN: 7 mg/dL (ref 6–20)
CO2: 28 mmol/L (ref 22–32)
Calcium: 10.2 mg/dL (ref 8.9–10.3)
Chloride: 97 mmol/L — ABNORMAL LOW (ref 98–111)
Creatinine, Ser: 0.65 mg/dL (ref 0.44–1.00)
GFR calc Af Amer: >60 mL/min (ref >60)
GFR calc non Af Amer: >60 mL/min (ref >60)
Glucose, Bld: 128 mg/dL — ABNORMAL HIGH (ref 70–99)
Potassium: 2.9 mmol/L — ABNORMAL LOW (ref 3.5–5.1)
Sodium: 138 mmol/L (ref 135–145)

## 2019-12-22 LAB — CBC
HCT: 43.1 % (ref 36.0–46.0)
Hemoglobin: 14 g/dL (ref 12.0–15.0)
MCH: 27.6 pg (ref 26.0–34.0)
MCHC: 32.5 g/dL (ref 30.0–36.0)
MCV: 84.8 fL (ref 80.0–100.0)
Platelets: 382 10*3/uL (ref 150–400)
RBC: 5.08 MIL/uL (ref 3.87–5.11)
RDW: 12.6 % (ref 11.5–15.5)
WBC: 4.5 10*3/uL (ref 4.0–10.5)
nRBC: 0 % (ref 0.0–0.2)

## 2019-12-22 LAB — I-STAT BETA HCG BLOOD, ED (MC, WL, AP ONLY): I-stat hCG, quantitative: <5 m[IU]/mL (ref <5)

## 2019-12-22 LAB — TROPONIN I (HIGH SENSITIVITY)
Troponin I (High Sensitivity): 5 ng/L (ref <18)
Troponin I (High Sensitivity): 3 ng/L (ref <18)

## 2019-12-22 LAB — TSH: TSH: 1.086 u[IU]/mL (ref 0.350–4.500)

## 2019-12-22 LAB — MAGNESIUM: Magnesium: 2 mg/dL (ref 1.7–2.4)

## 2019-12-22 LAB — D-DIMER, QUANTITATIVE: D-Dimer, Quant: 0.54 ug/mL-FEU — ABNORMAL HIGH (ref 0.00–0.50)

## 2019-12-22 MED ORDER — POTASSIUM CHLORIDE CRYS ER 20 MEQ PO TBCR
40.0000 meq | EXTENDED_RELEASE_TABLET | Freq: Once | ORAL | Status: AC
  Administered 2019-12-22: 40 meq via ORAL
  Filled 2019-12-22: qty 2

## 2019-12-22 MED ORDER — POTASSIUM CHLORIDE CRYS ER 20 MEQ PO TBCR
20.0000 meq | EXTENDED_RELEASE_TABLET | Freq: Every day | ORAL | 0 refills | Status: DC
Start: 2019-12-22 — End: 2020-06-15

## 2019-12-22 MED ORDER — IOHEXOL 350 MG/ML SOLN
80.0000 mL | Freq: Once | INTRAVENOUS | Status: AC | PRN
  Administered 2019-12-22: 80 mL via INTRAVENOUS

## 2019-12-22 MED ORDER — SODIUM CHLORIDE 0.9% FLUSH: 3.0000 mL | Freq: Once | INTRAVENOUS | Status: DC

## 2019-12-22 MED FILL — POTASSIUM CL ER 20 MEQ TABL: 20 | 7 days supply | Qty: 7 | Fill #0

## 2019-12-22 NOTE — ED Notes (Signed)
Pt transported to vascular.  °

## 2019-12-22 NOTE — ED Provider Notes (Signed)
49 year old female presents with palpitations, "pinching pain" in chest x2 weeks. As well as L foot/ankle swelling that improves with elevation. She is on estrogen and so D-dimer and LE DVT study were ordered which are pending at shift change. Pt is noted to be hypokalemic which could be cause of palpitations.  D-dimer is minimally elevated. Will order CTA chest to r/o PE.   CTA is negative for PE but does show emphysema. Discussed with pt. She has stopped smoking a little over a year ago. She was given rx for potassium and Cards f/u for possible holter monitoring.    Recardo Evangelist, PA-C 12/22/19 1915    Rex Kras Wenda Overland, MD 12/22/19 2215

## 2019-12-22 NOTE — ED Provider Notes (Signed)
[facility] EMERGENCY DEPARTMENT Provider Note   CSN: [id number] Arrival date & time: 12/22/19  1213     History Chief Complaint  Patient presents with  . Palpitations    Becky Koch is a 49 y.o. female presenting for evaluation of palpitations.  Patient states she has a long history of palpitations, but states over the past 2 weeks she has had worsening symptoms.  She states she is feeling palpitations several times a day, and often having a squeezing/pinching-like chest pain during the palpitations.  She is associated shortness of breath during these episodes.  She is currently not having any symptoms in the room.  There is nothing she can do to bring the symptoms on.  She denies associated nausea, vomiting, or diaphoresis.  She denies recent fevers, chills, cough, abdominal pain, urinary symptoms, abnormal bowel movements.  She had surgery in December 2020, approximately 5 months ago, no other recent surgeries.  She is currently on estrogen pills due to hot flashes.  2 days ago, she noticed swelling in her left foot.  She denies recent medication changes.  She denies caffeine use.  She denies alcohol or drug use.  She reports that she vapes, but stopped using cigarettes in August 2020.  She not take any supplements.  Additional history taken chart review.  Patient with a history of anemia, fibroids status post hysterectomy, GERD, hypertension.    HPI     Past Medical History:  Diagnosis Date  . Allergy   . Anemia   . Blood transfusion without reported diagnosis   . Fibroid, uterine   . GERD (gastroesophageal reflux disease)   . Hypertension     Patient Active Problem List   Diagnosis Date Noted  . SBO (small bowel obstruction) ([place]) 08/02/2019  . DUB (dysfunctional uterine bleeding) 07/28/2019  . Post-operative state 07/28/2019  . Gastroesophageal reflux disease with esophagitis 12/05/2018  . Tobacco use 06/23/2018  . Actinic keratoses 05/02/2017  .  HTN (hypertension) 07/14/2012    Past Surgical History:  Procedure Laterality Date  . LAPAROSCOPIC LYSIS OF ADHESIONS N/A 08/03/2019   Procedure: Laparoscopic Lysis Of Adhesions;  Surgeon: [name], MD;  Location: [facility];  Service: General;  Laterality: N/A;  . LAPAROSCOPY N/A 08/03/2019   Procedure: LAPAROSCOPY DIAGNOSTIC;  Surgeon: [name], MD;  Location: [facility];  Service: General;  Laterality: N/A;  . TUBAL LIGATION    . VAGINAL HYSTERECTOMY Bilateral 07/28/2019   Procedure: HYSTERECTOMY VAGINAL WITH BILATERAL SALPINGECTOMY;  Surgeon: Emily Filbert, MD;  Location: [facility];  Service: Gynecology;  Laterality: Bilateral;     OB History    Gravida  8   Para  4   Term  4   Preterm      AB  4   Living  4     SAB  1   TAB  3   Ectopic      Multiple      Live Births              Family History  Problem Relation Age of Onset  . Cancer Paternal Grandfather   . Colon cancer Neg Hx   . Colon polyps Neg Hx   . Esophageal cancer Neg Hx   . Rectal cancer Neg Hx   . Stomach cancer Neg Hx     Social History   Tobacco Use  . Smoking status: Former Smoker    Packs/day: 0.50    Types: Cigarettes  Quit date: 03/21/2019    Years since quitting: 0.7  . Smokeless tobacco: Never Used  Substance Use Topics  . Alcohol use: Yes    Comment: social alcohol  . Drug use: No    Home Medications Prior to Admission medications   Medication Sig Start Date End Date Taking? Authorizing Provider  alum & mag hydroxide-simeth (MAALOX/MYLANTA) 200-200-20 MG/5ML suspension Take 15 mLs by mouth every 6 (six) hours as needed for indigestion or heartburn.    [provider]  amLODipine (NORVASC) 10 MG tablet TAKE 1 TABLET (10 MG TOTAL) BY MOUTH DAILY. 09/29/19   Fulp, Cammie, MD  cetirizine (ZYRTEC) 10 MG tablet Take 1 tablet (10 mg total) by mouth daily. Patient not taking: Reported on 09/15/2019 12/05/18   Kerin Perna, NP  docusate  sodium (COLACE) 100 MG capsule Take 100 mg by mouth 2 (two) times daily.    [provider]  estradiol (ESTRACE) 2 MG tablet Take 1 tablet (2 mg total) by mouth daily. Patient not taking: Reported on 09/15/2019 09/02/19   Emily Filbert, MD  estrogens, conjugated, (PREMARIN) 0.45 MG tablet Take 1 tablet (0.45 mg total) by mouth daily. Take daily for 21 days then do not take for 7 days. 08/19/19   Emily Filbert, MD  hydrochlorothiazide (HYDRODIURIL) 25 MG tablet TAKE 1 TABLET BY MOUTH DAILY 09/29/19   Fulp, Cammie, MD  hydrocortisone 2.5 % ointment Apply topically 2 (two) times daily. Patient taking differently: Apply 1 application topically 2 (two) times daily.  04/02/19   Lind Covert, MD  ibuprofen (ADVIL) 600 MG tablet Take 1 tablet (600 mg total) by mouth every 6 (six) hours as needed. 07/28/19   Emily Filbert, MD  Metoprolol Succinate 25 MG CS24 Take 25 mg by mouth daily. 04/15/19   Fulp, Cammie, MD  omeprazole (PRILOSEC) 20 MG capsule Take 1 capsule (20 mg total) by mouth daily. Must have office visit for refills 11/03/19   Fulp, Cammie, MD  oxyCODONE (OXY IR/ROXICODONE) 5 MG immediate release tablet Take 1 tablet (5 mg total) by mouth every 6 (six) hours as needed for severe pain. Patient not taking: Reported on 09/15/2019 08/07/19   Wellington Hampshire, PA-C  oxyCODONE-acetaminophen (PERCOCET/ROXICET) 5-325 MG tablet Take 1 tablet by mouth every 6 (six) hours as needed. Patient not taking: Reported on 09/15/2019 08/19/19   Emily Filbert, MD  polyethylene glycol (MIRALAX / GLYCOLAX) 17 g packet Take 17 g by mouth daily as needed for mild constipation. 08/07/19   Meuth, Brooke A, PA-C  potassium chloride SA (KLOR-CON) 20 MEQ tablet Take 1 tablet (20 mEq total) by mouth daily for 7 days. 12/22/19 12/29/19  Arda Daggs, PA-C  traMADol (ULTRAM) 50 MG tablet Take 1 tablet (50 mg total) by mouth every 6 (six) hours as needed. 08/19/19   Emily Filbert, MD  traZODone (DESYREL) 50 MG tablet Take  0.5-1 tablets (25-50 mg total) by mouth at bedtime as needed for sleep. Patient taking differently: Take 50 mg by mouth at bedtime as needed for sleep.  02/18/19   Argentina Donovan, PA-C    Allergies    Patient has no known allergies.  Review of Systems   Review of Systems  Cardiovascular: Positive for chest pain, palpitations and leg swelling.  All other systems reviewed and are negative.   Physical Exam Updated Vital Signs BP (!) 133/93   Pulse 75   Temp 98.9 F (37.2 C) (Oral)   Resp 19  Ht 5' 5.5" (1.664 m)   Wt 63.5 kg   LMP 07/24/2019   SpO2 100%   BMI 22.94 kg/m   Physical Exam Vitals and nursing note reviewed.  Constitutional:      General: She is not in acute distress.    Appearance: She is well-developed.     Comments: Resting in the bed in NAD  HENT:     Head: Normocephalic and atraumatic.  Eyes:     Extraocular Movements: Extraocular movements intact.     Conjunctiva/sclera: Conjunctivae normal.     Pupils: Pupils are equal, round, and reactive to light.  Cardiovascular:     Rate and Rhythm: Normal rate and regular rhythm.     Pulses: Normal pulses.     Comments: initially tachy on arrival to the ED, not tachy on my exam.  Regular rate and rhythm Pulmonary:     Effort: Pulmonary effort is normal. No respiratory distress.     Breath sounds: Normal breath sounds. No wheezing.     Comments: Clear lung sounds in all fields. Speaking in full sentences. In no respiratory distress Abdominal:     General: There is no distension.     Palpations: Abdomen is soft. There is no mass.     Tenderness: There is no abdominal tenderness. There is no guarding or rebound.  Musculoskeletal:        General: Normal range of motion.     Cervical back: Normal range of motion and neck supple.     Comments: Mild generalized swelling of the L foot. No tenderness. No pain of the L calf.   Skin:    General: Skin is warm and dry.     Capillary Refill: Capillary refill takes less  than 2 seconds.  Neurological:     Mental Status: She is alert and oriented to person, place, and time.     ED Results / Procedures / Treatments   Labs (all labs ordered are listed, but only abnormal results are displayed) Labs Reviewed  BASIC METABOLIC PANEL - Abnormal; Notable for the following components:      Result Value   Potassium 2.9 (*)    Chloride 97 (*)    Glucose, Bld 128 (*)    All other components within normal limits  D-DIMER, QUANTITATIVE (NOT AT [facility]) - Abnormal; Notable for the following components:   D-Dimer, Quant 0.54 (*)    All other components within normal limits  CBC  MAGNESIUM  TSH  I-STAT BETA HCG BLOOD, ED ([facility], [facility], [facility] ONLY)  TROPONIN I (HIGH SENSITIVITY)  TROPONIN I (HIGH SENSITIVITY)    EKG None  Radiology DG Chest 2 View  Result Date: 12/22/2019 CLINICAL DATA:  Palpitations. Lightheadedness. Chest pain. Symptoms for the past few weeks. EXAM: CHEST - 2 VIEW COMPARISON:  05/14/2018 FINDINGS: The heart size and mediastinal contours are within normal limits. Both lungs are clear. The visualized skeletal structures are unremarkable. IMPRESSION: No active cardiopulmonary disease. Electronically Signed   By: Nolon Nations M.D.   On: 12/22/2019 12:51   VAS [place] LOWER EXTREMITY VENOUS (DVT) ([facility] and [facility] 7a-7p)  Result Date: 12/22/2019  Lower Venous DVTStudy Indications: Swelling.  Performing Technologist: June Leap RDMS, RVT  Examination Guidelines: A complete evaluation includes B-mode imaging, spectral Doppler, color Doppler, and power Doppler as needed of all accessible portions of each vessel. Bilateral testing is considered an integral part of a complete examination. Limited examinations for reoccurring indications may be performed as noted. The reflux portion of  the exam is performed with the patient in reverse Trendelenburg.  +-----+---------------+---------+-----------+----------+--------------+  RIGHTCompressibilityPhasicitySpontaneityPropertiesThrombus Aging +-----+---------------+---------+-----------+----------+--------------+ CFV  Full           Yes      Yes                                 +-----+---------------+---------+-----------+----------+--------------+   +---------+---------------+---------+-----------+----------+--------------+ LEFT     CompressibilityPhasicitySpontaneityPropertiesThrombus Aging +---------+---------------+---------+-----------+----------+--------------+ CFV      Full           Yes      Yes                                 +---------+---------------+---------+-----------+----------+--------------+ SFJ      Full                                                        +---------+---------------+---------+-----------+----------+--------------+ FV Prox  Full                                                        +---------+---------------+---------+-----------+----------+--------------+ FV Mid   Full                                                        +---------+---------------+---------+-----------+----------+--------------+ FV DistalFull                                                        +---------+---------------+---------+-----------+----------+--------------+ PFV      Full                                                        +---------+---------------+---------+-----------+----------+--------------+ POP      Full           Yes      Yes                                 +---------+---------------+---------+-----------+----------+--------------+ PTV      Full                                                        +---------+---------------+---------+-----------+----------+--------------+ PERO     Full                                                        +---------+---------------+---------+-----------+----------+--------------+  Summary: RIGHT: - No evidence of common femoral vein  obstruction.  LEFT: - There is no evidence of deep vein thrombosis in the lower extremity.  - No cystic structure found in the popliteal fossa.  *See table(s) above for measurements and observations.    Preliminary     Procedures Procedures (including critical care time)  Medications Ordered in ED Medications  sodium chloride flush (NS) 0.9 % injection 3 mL (has no administration in time range)  potassium chloride SA (KLOR-CON) CR tablet 40 mEq (40 mEq Oral Given 12/22/19 1458)    ED Course  I have reviewed the triage vital signs and the nursing notes.  Pertinent labs & imaging results that were available during my care of the patient were reviewed by me and considered in my medical decision making (see chart for details).    MDM Rules/Calculators/A&P                      Pt presenting for evaluation of palpations and chest squeezing. On exam, pt appears nontoxic. She does have mild left foot swelling, though no tenderness.  She is currently on estrogen, and was initially tachycardic on arrival to the ER, with concern for possible PE, the history is slightly atypical.  Low suspicion for ACS.  Labs obtained from triage interpreted by me.  Mild hypokalemia 2.9, could be contributing to her palpitations.  Hemoglobin stable.  Electrolytes otherwise stable.  Initial troponin normal at 5.   Repeat troponin stable at 3.  Normal.  Unfortunately D-dimer is mildly elevated, and this patient will need a CTA to rule out PE.  DVT ultrasound of the left leg negative.  Pt signed out to Raquel James, PA-C for f/u on CTA. If negative, plan for d/c with potassium and cards follow up.   Final Clinical Impression(s) / ED Diagnoses Final diagnoses:  Palpitations  Hypokalemia    Rx / DC Orders ED Discharge Orders         Ordered    potassium chloride SA (KLOR-CON) 20 MEQ tablet  Daily     12/22/19 1526           Franchot Heidelberg, PA-C 12/22/19 1537    Davonna Belling, MD 12/24/19 [phone]

## 2019-12-22 NOTE — ED Notes (Signed)
Pt transported to CT ?

## 2019-12-22 NOTE — ED Triage Notes (Signed)
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

## 2019-12-22 NOTE — Progress Notes (Signed)
Lower venous duplex       has been completed. Preliminary results can be found under CV proc through chart review. Joetta Delprado, BS, RDMS, RVT   

## 2019-12-22 NOTE — Discharge Instructions (Signed)
Take potassium as prescribed.  Take the entire course, even if your symptoms improve. Follow-up with your primary care doctor for recheck of your potassium. Follow-up with the heart doctors listed below for further evaluation and management of your palpitations. Return to the emergency room with any new, worsening, concerning symptoms.

## 2020-01-06 ENCOUNTER — Other Ambulatory Visit: Payer: Self-pay | Admitting: Family Medicine

## 2020-01-06 DIAGNOSIS — I10 Essential (primary) hypertension: Secondary | ICD-10-CM

## 2020-01-06 MED FILL — ESTRADIOL 2 MG TABLET: 2 | 30 days supply | Qty: 30 | Fill #4

## 2020-01-07 MED FILL — HYDROCHLOROTHIAZIDE 25 MG T: 25 | 30 days supply | Qty: 30 | Fill #0

## 2020-01-07 MED FILL — AMLODIPINE BESYLATE 10 MG T: 10 | 30 days supply | Qty: 30 | Fill #0

## 2020-02-16 ENCOUNTER — Other Ambulatory Visit: Payer: Self-pay | Admitting: Family Medicine

## 2020-02-16 DIAGNOSIS — I10 Essential (primary) hypertension: Secondary | ICD-10-CM

## 2020-02-16 MED FILL — ESTRADIOL 2 MG TABLET: 2 | 30 days supply | Qty: 30 | Fill #5

## 2020-02-23 ENCOUNTER — Other Ambulatory Visit: Payer: Self-pay | Admitting: Family Medicine

## 2020-02-23 DIAGNOSIS — I10 Essential (primary) hypertension: Secondary | ICD-10-CM

## 2020-02-23 MED ORDER — AMLODIPINE BESYLATE 10 MG PO TABS: 10.0000 mg | ORAL_TABLET | Freq: Every day | ORAL | 0 refills | Status: DC

## 2020-02-23 MED ORDER — HYDROCHLOROTHIAZIDE 25 MG PO TABS: ORAL_TABLET | ORAL | 0 refills | Status: DC

## 2020-02-23 MED FILL — ?AMLODIPINE BESYL 10MG TABL: 10 | 30 days supply | Qty: 30 | Fill #0

## 2020-02-23 MED FILL — HYDROCHLOROTHIAZIDE 25 MG T: 25 | 30 days supply | Qty: 30 | Fill #0

## 2020-02-23 NOTE — Telephone Encounter (Signed)
Copied from [place] [id number]. Topic: Quick Communication - Rx Refill/Question >> Feb 23, 2020 11:29 AM Leward Quan A wrote: Medication: amLODipine (NORVASC) 10 MG tablet, hydrochlorothiazide (HYDRODIURIL) 25 MG tablet   Patient request refill until appointment 04/20/20   Has the patient contacted their pharmacy? Yes.   (Agent: If no, request that the patient contact the pharmacy for the refill.) (Agent: If yes, when and what did the pharmacy advise?)  Preferred Pharmacy (with phone number or street name): [facility] [place] [address]  Phone:  [phone] Fax:  [phone]     Agent: Please be advised that RX refills may take up to 3 business days. We ask that you follow-up with your pharmacy.

## 2020-03-31 MED FILL — ESTRADIOL 2 MG TABLET: 2 | 30 days supply | Qty: 30 | Fill #6

## 2020-03-31 MED FILL — HYDROCHLOROTHIAZIDE 25 MG T: 25 | 30 days supply | Qty: 30 | Fill #1

## 2020-03-31 MED FILL — AMLODIPINE BESYLATE 10 MG T: 10 | 30 days supply | Qty: 30 | Fill #1

## 2020-04-20 ENCOUNTER — Ambulatory Visit: Payer: Self-pay | Admitting: Family Medicine

## 2020-04-21 ENCOUNTER — Encounter: Payer: Self-pay | Admitting: Internal Medicine

## 2020-04-21 ENCOUNTER — Other Ambulatory Visit: Payer: Self-pay

## 2020-04-21 ENCOUNTER — Ambulatory Visit: Payer: Self-pay | Attending: Internal Medicine | Admitting: Internal Medicine

## 2020-04-21 VITALS — BP 130/88 | HR 93 | Temp 97.7°F | Ht 65.5 in | Wt 150.0 lb

## 2020-04-21 DIAGNOSIS — R002 Palpitations: Secondary | ICD-10-CM

## 2020-04-21 DIAGNOSIS — B353 Tinea pedis: Secondary | ICD-10-CM

## 2020-04-21 DIAGNOSIS — Z8719 Personal history of other diseases of the digestive system: Secondary | ICD-10-CM | POA: Insufficient documentation

## 2020-04-21 DIAGNOSIS — L729 Follicular cyst of the skin and subcutaneous tissue, unspecified: Secondary | ICD-10-CM

## 2020-04-21 DIAGNOSIS — I10 Essential (primary) hypertension: Secondary | ICD-10-CM

## 2020-04-21 DIAGNOSIS — Z90710 Acquired absence of both cervix and uterus: Secondary | ICD-10-CM | POA: Insufficient documentation

## 2020-04-21 DIAGNOSIS — Z23 Encounter for immunization: Secondary | ICD-10-CM

## 2020-04-21 DIAGNOSIS — K2100 Gastro-esophageal reflux disease with esophagitis, without bleeding: Secondary | ICD-10-CM | POA: Insufficient documentation

## 2020-04-21 DIAGNOSIS — Z90722 Acquired absence of ovaries, bilateral: Secondary | ICD-10-CM | POA: Insufficient documentation

## 2020-04-21 DIAGNOSIS — R739 Hyperglycemia, unspecified: Secondary | ICD-10-CM

## 2020-04-21 DIAGNOSIS — Z79899 Other long term (current) drug therapy: Secondary | ICD-10-CM | POA: Insufficient documentation

## 2020-04-21 DIAGNOSIS — F17290 Nicotine dependence, other tobacco product, uncomplicated: Secondary | ICD-10-CM | POA: Insufficient documentation

## 2020-04-21 MED ORDER — HYDROCHLOROTHIAZIDE 25 MG PO TABS: ORAL_TABLET | ORAL | 0 refills | Status: DC

## 2020-04-21 MED ORDER — OMEPRAZOLE 20 MG PO CPDR: 20.0000 mg | DELAYED_RELEASE_CAPSULE | Freq: Every day | ORAL | 3 refills | Status: DC

## 2020-04-21 MED ORDER — AMLODIPINE BESYLATE 10 MG PO TABS: 10.0000 mg | ORAL_TABLET | Freq: Every day | ORAL | 0 refills | Status: DC

## 2020-04-21 MED FILL — OMEPRAZOLE 20 MG CAP: 20 | 30 days supply | Qty: 30 | Fill #0

## 2020-04-21 NOTE — Progress Notes (Signed)
Patient ID: Becky Koch, female    DOB: 1970-09-12  MRN: [id number]  CC: Follow-up   Subjective: Becky Koch is a 49 y.o. female who presents for med RF.  PCP is Dr. Chapman Fitch. Her concerns today include:  Patient with history of HTN, GERD, tobacco dependence, DUB  PCP is Dr. Chapman Fitch.  HTN:  Needs RF on Norvasc and HCTZ Checks BP occasionally at home.  DBP usually in the high 80s -I see K+ supplement on her med list from ER 12/2019.  She was seen in the ER for palpitations and found to have low potassium.  She was given a prescription for 1 week of potassium supplement. Started taking OTC K+ 2 x a wk since August because she started having palpitations again.  Has palpttations every day.  Last a few seconds.    She complains of itching and peeling of the skin of the right foot both on the top and plantar surface.  She thinks it is fungus.  She has tried some over-the-counter creams and sprays without success.  Complains of having a small knot in the left upper quadrant of the abdomen that was discovered last year prior to her hysterectomy.  Since then she has felt another one just below it.  No increase in size.  It is not painful.    Had boil under LT arm last week.  She popped it.  Pus was released.  No longer painful but the area around it feels uncomfortable.  She wanted me to take a look at it. Patient Active Problem List   Diagnosis Date Noted  . SBO (small bowel obstruction) ([place]) 08/02/2019  . DUB (dysfunctional uterine bleeding) 07/28/2019  . Post-operative state 07/28/2019  . Gastroesophageal reflux disease with esophagitis 12/05/2018  . Tobacco use 06/23/2018  . Actinic keratoses 05/02/2017  . HTN (hypertension) 07/14/2012     Current Outpatient Medications on File Prior to Visit  Medication Sig Dispense Refill  . [facility] POTASSIUM GLUCONATE PO Take 1 tablet by mouth every other day.    . estradiol (ESTRACE) 2 MG tablet Take 1 tablet (2 mg total) by mouth daily. 90 tablet 6    . estrogens, conjugated, (PREMARIN) 0.45 MG tablet Take 1 tablet (0.45 mg total) by mouth daily. Take daily for 21 days then do not take for 7 days. (Patient not taking: Reported on 12/22/2019) 90 tablet 6  . Metoprolol Succinate 25 MG CS24 Take 25 mg by mouth daily. (Patient not taking: Reported on 12/22/2019) 30 capsule 11  . potassium chloride SA (KLOR-CON) 20 MEQ tablet Take 1 tablet (20 mEq total) by mouth daily for 7 days. 7 tablet 0  . [DISCONTINUED] cetirizine (ZYRTEC) 10 MG tablet Take 1 tablet (10 mg total) by mouth daily. (Patient not taking: Reported on 09/15/2019) 30 tablet 11  . [DISCONTINUED] traZODone (DESYREL) 50 MG tablet Take 0.5-1 tablets (25-50 mg total) by mouth at bedtime as needed for sleep. (Patient not taking: Reported on 12/22/2019) 30 tablet 3   No current facility-administered medications on file prior to visit.    No Known Allergies  Social History   Socioeconomic History  . Marital status: Legally Separated    Spouse name: Not on file  . Number of children: Not on file  . Years of education: Not on file  . Highest education level: Not on file  Occupational History  . Not on file  Tobacco Use  . Smoking status: Current Every Day Smoker    Packs/day: 0.50  Types: E-cigarettes    Last attempt to quit: 03/21/2019    Years since quitting: 1.0  . Smokeless tobacco: Never Used  Vaping Use  . Vaping Use: Never used  Substance and Sexual Activity  . Alcohol use: Yes    Comment: social alcohol  . Drug use: No  . Sexual activity: Yes    Birth control/protection: Surgical  Other Topics Concern  . Not on file  Social History Narrative  . Not on file   Social Determinants of Health   Financial Resource Strain:   . Difficulty of Paying Living Expenses: Not on file  Food Insecurity:   . Worried About Charity fundraiser in the Last Year: Not on file  . Ran Out of Food in the Last Year: Not on file  Transportation Needs:   . Lack of Transportation (Medical):  Not on file  . Lack of Transportation (Non-Medical): Not on file  Physical Activity:   . Days of Exercise per Week: Not on file  . Minutes of Exercise per Session: Not on file  Stress:   . Feeling of Stress : Not on file  Social Connections:   . Frequency of Communication with Friends and Family: Not on file  . Frequency of Social Gatherings with Friends and Family: Not on file  . Attends Religious Services: Not on file  . Active Member of Clubs or Organizations: Not on file  . Attends Archivist Meetings: Not on file  . Marital Status: Not on file  Intimate Partner Violence:   . Fear of Current or Ex-Partner: Not on file  . Emotionally Abused: Not on file  . Physically Abused: Not on file  . Sexually Abused: Not on file    Family History  Problem Relation Age of Onset  . Cancer Paternal Grandfather   . Colon cancer Neg Hx   . Colon polyps Neg Hx   . Esophageal cancer Neg Hx   . Rectal cancer Neg Hx   . Stomach cancer Neg Hx     Past Surgical History:  Procedure Laterality Date  . LAPAROSCOPIC LYSIS OF ADHESIONS N/A 08/03/2019   Procedure: Laparoscopic Lysis Of Adhesions;  Surgeon: [name], MD;  Location: [facility];  Service: General;  Laterality: N/A;  . LAPAROSCOPY N/A 08/03/2019   Procedure: LAPAROSCOPY DIAGNOSTIC;  Surgeon: [name], MD;  Location: [facility];  Service: General;  Laterality: N/A;  . TUBAL LIGATION    . VAGINAL HYSTERECTOMY Bilateral 07/28/2019   Procedure: HYSTERECTOMY VAGINAL WITH BILATERAL SALPINGECTOMY;  Surgeon: Emily Filbert, MD;  Location: [facility];  Service: Gynecology;  Laterality: Bilateral;    ROS: Review of Systems Negative except as stated above  PHYSICAL EXAM: BP 130/88 (BP Location: Left Arm, Patient Position: Sitting, Cuff Size: Normal)   Pulse 93   Temp 97.7 F (36.5 C) (Temporal)   Ht 5' 5.5" (1.664 m)   Wt 150 lb (68 kg)   LMP 07/24/2019   SpO2 100%   BMI 24.58 kg/m   Wt Readings from  Last 3 Encounters:  04/21/20 150 lb (68 kg)  12/22/19 140 lb (63.5 kg)  09/15/19 160 lb 11.2 oz (72.9 kg)    Physical Exam  General appearance - alert, well appearing, and in no distress Mental status - normal mood, behavior, speech, dress, motor activity, and thought processes Chest - clear to auscultation, no wheezes, rales or rhonchi, symmetric air entry Heart - normal rate, regular rhythm, normal S1, S2, no murmurs, rubs,  clicks or gallops Extremities - peripheral pulses normal, no pedal edema, no clubbing or cyanosis Skin -right foot: She has dry peeling skin on the plantar surface in between the toes. Abdomen: She has 2 small pea-sized subcutaneous cyst felt below the skin in the left upper to mid quadrant of the abdomen.  They are nontender to touch.  CMP Latest Ref Rng & Units 12/22/2019 08/06/2019 08/04/2019  Glucose 70 - 99 mg/dL 128(H) 134(H) 101(H)  BUN 6 - 20 mg/dL 7 9 6   Creatinine 0.44 - 1.00 mg/dL 0.65 0.93 0.73  Sodium 135 - 145 mmol/L 138 138 138  Potassium 3.5 - 5.1 mmol/L 2.9(L) 3.6 4.3  Chloride 98 - 111 mmol/L 97(L) 103 107  CO2 22 - 32 mmol/L 28 22 21(L)  Calcium 8.9 - 10.3 mg/dL 10.2 9.9 9.2  Total Protein 6.5 - 8.1 g/dL - - -  Total Bilirubin 0.3 - 1.2 mg/dL - - -  Alkaline Phos 38 - 126 U/L - - -  AST 15 - 41 U/L - - -  ALT 0 - 44 U/L - - -   Lipid Panel     Component Value Date/Time   CHOL 152 06/21/2016 0952   TRIG 111 06/21/2016 0952   HDL 35 (L) 06/21/2016 0952   CHOLHDL 4.3 06/21/2016 0952   VLDL 22 06/21/2016 0952   LDLCALC 95 06/21/2016 0952    CBC    Component Value Date/Time   WBC 4.5 12/22/2019 1231   RBC 5.08 12/22/2019 1231   HGB 14.0 12/22/2019 1231   HGB 14.6 02/18/2019 1559   HCT 43.1 12/22/2019 1231   HCT 44.9 02/18/2019 1559   PLT 382 12/22/2019 1231   PLT 363 02/18/2019 1559   MCV 84.8 12/22/2019 1231   MCV 91 02/18/2019 1559   MCH 27.6 12/22/2019 1231   MCHC 32.5 12/22/2019 1231   RDW 12.6 12/22/2019 1231   RDW 12.5  02/18/2019 1559   LYMPHSABS 1.1 08/02/2019 0626   LYMPHSABS 2.2 02/18/2019 1559   MONOABS 0.7 08/02/2019 0626   EOSABS 0.1 08/02/2019 0626   EOSABS 0.1 02/18/2019 1559   BASOSABS 0.0 08/02/2019 0626   BASOSABS 0.0 02/18/2019 1559   EKG normal sinus rhythm.  No acute changes.  Appears unchanged from EKG done earlier in the year.  ASSESSMENT AND PLAN: 1. Essential hypertension Not at goal.  I recommend adding another medication versus having her come back to have blood pressure rechecked by the clinical pharmacist.  She prefers to do the latter. - amLODipine (NORVASC) 10 MG tablet; Take 1 tablet (10 mg total) by mouth daily. Must have office visit for refills  Dispense: 90 tablet; Refill: 0 - hydrochlorothiazide (HYDRODIURIL) 25 MG tablet; TAKE 1 TABLET BY MOUTH DAILY. Must have office visit for refills  Dispense: 90 tablet; Refill: 0  2. Palpitations Check chemistry and TSH today.  If electrolytes are okay and TSH is normal, next step would be to refer to cardiology - Basic Metabolic Panel - TSH - EKG 12-Lead  3. Subcutaneous cyst Recommend observe for now.  Let us know if there is any increase in size or if it becomes painful  4. Tinea pedis of right foot Recommend using Lamisil cream twice a day  5. Hyperglycemia Requesting to be checked as blood sugars were a little elevated on previous chemistry. - Hemoglobin A1c  6. Need for immunization against influenza - Flu Vaccine QUAD 36+ mos IM    Patient was given the opportunity to ask questions.  Patient verbalized understanding of the plan and was able to repeat key elements of the plan.   Orders Placed This Encounter  Procedures  . Flu Vaccine QUAD 36+ mos IM  . Basic Metabolic Panel  . Hemoglobin A1c  . TSH  . EKG 12-Lead     Requested Prescriptions   Signed Prescriptions Disp Refills  . omeprazole (PRILOSEC) 20 MG capsule 30 capsule 3    Sig: Take 1 capsule (20 mg total) by mouth daily. Must have office visit  for refills  . amLODipine (NORVASC) 10 MG tablet 90 tablet 0    Sig: Take 1 tablet (10 mg total) by mouth daily. Must have office visit for refills  . hydrochlorothiazide (HYDRODIURIL) 25 MG tablet 90 tablet 0    Sig: TAKE 1 TABLET BY MOUTH DAILY. Must have office visit for refills    Return in about 3 months (around 07/21/2020) for f/u in 3 months with Fulp.  f/u in 2 wks with Lurena Joiner for BP recheck.  Karle Plumber, MD, FACP

## 2020-04-21 NOTE — Patient Instructions (Addendum)
Purchase and use Lamisil cream over-the-counter for 3 rash on the right foot.  You should use the cream twice a day.  Your blood pressure today was not at goal.  The goal is 130/80 or lower.  Please check your blood pressure at least once a week and write down the numbers.  Bring them in in 2 weeks when you see the clinical pharmacist.  Influenza Virus Vaccine injection (Fluarix) What is this medicine? INFLUENZA VIRUS VACCINE (in floo EN zuh VAHY ruhs vak SEEN) helps to reduce the risk of getting influenza also known as the flu. This medicine may be used for other purposes; ask your health care provider or pharmacist if you have questions. COMMON BRAND NAME(S): Fluarix, Fluzone What should I tell my health care provider before I take this medicine? They need to know if you have any of these conditions:  bleeding disorder like hemophilia  fever or infection  Guillain-Barre syndrome or other neurological problems  immune system problems  infection with the human immunodeficiency virus (HIV) or AIDS  low blood platelet counts  multiple sclerosis  an unusual or allergic reaction to influenza virus vaccine, eggs, chicken proteins, latex, gentamicin, other medicines, foods, dyes or preservatives  pregnant or trying to get pregnant  breast-feeding How should I use this medicine? This vaccine is for injection into a muscle. It is given by a health care professional. A copy of Vaccine Information Statements will be given before each vaccination. Read this sheet carefully each time. The sheet may change frequently. Talk to your pediatrician regarding the use of this medicine in children. Special care may be needed. Overdosage: If you think you have taken too much of this medicine contact a poison control center or emergency room at once. NOTE: This medicine is only for you. Do not share this medicine with others. What if I miss a dose? This does not apply. What may interact with this  medicine?  chemotherapy or radiation therapy  medicines that lower your immune system like etanercept, anakinra, infliximab, and adalimumab  medicines that treat or prevent blood clots like warfarin  phenytoin  steroid medicines like prednisone or cortisone  theophylline  vaccines This list may not describe all possible interactions. Give your health care provider a list of all the medicines, herbs, non-prescription drugs, or dietary supplements you use. Also tell them if you smoke, drink alcohol, or use illegal drugs. Some items may interact with your medicine. What should I watch for while using this medicine? Report any side effects that do not go away within 3 days to your doctor or health care professional. Call your health care provider if any unusual symptoms occur within 6 weeks of receiving this vaccine. You may still catch the flu, but the illness is not usually as bad. You cannot get the flu from the vaccine. The vaccine will not protect against colds or other illnesses that may cause fever. The vaccine is needed every year. What side effects may I notice from receiving this medicine? Side effects that you should report to your doctor or health care professional as soon as possible:  allergic reactions like skin rash, itching or hives, swelling of the face, lips, or tongue Side effects that usually do not require medical attention (report to your doctor or health care professional if they continue or are bothersome):  fever  headache  muscle aches and pains  pain, tenderness, redness, or swelling at site where injected  weak or tired This list may not describe  all possible side effects. Call your doctor for medical advice about side effects. You may report side effects to FDA at 1-800-FDA-1088. Where should I keep my medicine? This vaccine is only given in a clinic, pharmacy, doctor's office, or other health care setting and will not be stored at home. NOTE: This sheet  is a summary. It may not cover all possible information. If you have questions about this medicine, talk to your doctor, pharmacist, or health care provider.  2020 Elsevier/Gold Standard (2008-03-03 09:30:40)

## 2020-04-21 NOTE — Progress Notes (Signed)
Needs Prilosec refilled, currently out  Taking OTC potassium x 1 month, started due to hear palpitations

## 2020-04-22 ENCOUNTER — Encounter: Payer: Self-pay | Admitting: *Deleted

## 2020-04-22 ENCOUNTER — Other Ambulatory Visit: Payer: Self-pay | Admitting: Internal Medicine

## 2020-04-22 DIAGNOSIS — R002 Palpitations: Secondary | ICD-10-CM

## 2020-04-22 LAB — BASIC METABOLIC PANEL
BUN/Creatinine Ratio: 14 (ref 9–23)
BUN: 10 mg/dL (ref 6–24)
CO2: 24 mmol/L (ref 20–29)
Calcium: 10.1 mg/dL (ref 8.7–10.2)
Chloride: 99 mmol/L (ref 96–106)
Creatinine, Ser: 0.72 mg/dL (ref 0.57–1.00)
GFR calc Af Amer: 112 mL/min/{1.73_m2} (ref >59)
GFR calc non Af Amer: 97 mL/min/{1.73_m2} (ref >59)
Glucose: 84 mg/dL (ref 65–99)
Potassium: 4.5 mmol/L (ref 3.5–5.2)
Sodium: 137 mmol/L (ref 134–144)

## 2020-04-22 LAB — HEMOGLOBIN A1C
Est. average glucose Bld gHb Est-mCnc: 103 mg/dL
Hgb A1c MFr Bld: 5.2 % (ref 4.8–5.6)

## 2020-04-22 LAB — TSH: TSH: 2.06 u[IU]/mL (ref 0.450–4.500)

## 2020-04-28 ENCOUNTER — Ambulatory Visit: Payer: Self-pay | Admitting: Cardiovascular Disease

## 2020-05-02 MED FILL — AMLODIPINE BESYLATE 10 MG T: 10 | 30 days supply | Qty: 30 | Fill #0

## 2020-05-02 MED FILL — OMEPRAZOLE 20 MG CAP: 20 | 30 days supply | Qty: 30 | Fill #0

## 2020-05-02 MED FILL — HYDROCHLOROTHIAZIDE 25 MG T: 25 | 30 days supply | Qty: 30 | Fill #0

## 2020-05-05 ENCOUNTER — Ambulatory Visit: Payer: Self-pay | Attending: Family Medicine | Admitting: Pharmacist

## 2020-05-05 ENCOUNTER — Other Ambulatory Visit: Payer: Self-pay

## 2020-05-05 ENCOUNTER — Other Ambulatory Visit: Payer: Self-pay | Admitting: Internal Medicine

## 2020-05-05 DIAGNOSIS — I10 Essential (primary) hypertension: Secondary | ICD-10-CM

## 2020-05-05 MED ORDER — METOPROLOL SUCCINATE ER 25 MG PO TB24: 25.0000 mg | ORAL_TABLET | Freq: Every day | ORAL | 2 refills | Status: DC

## 2020-05-05 MED FILL — ESTRADIOL 2 MG TABLET: 2 | 30 days supply | Qty: 30 | Fill #7

## 2020-05-05 MED FILL — METOPROLOL SUCCINATE ER 25: 25 | 30 days supply | Qty: 30 | Fill #0

## 2020-05-05 NOTE — Progress Notes (Signed)
   S:    PCP: Dr. Wynetta Emery   Patient arrives in good spirits. Presents to the clinic for hypertension evaluation, counseling, and management. Patient was referred on 04/21/2020 by Dr. Wynetta Emery.  BP was above goal at that visit.   Today, pt reports feeling well overall. Denies chest pain, dyspnea, HA or blurred vision. No LE edema. Does endorse continued palpitations that occur when exercising. Per pt, these do not occur at rest.   Medication adherence reported with amlodipine and HCTZ. She does not take the metoprolol.   Current BP Medications include:  Amlodipine 10 mg daily, HCTZ 25 mg daily, metoprolol 25 mg daily (not taking)  Dietary habits include: compliant with salt restriction; denies drinking caffeine  Exercise habits include: exercises daily (walks, jump rope, jumping jacks) Family / Social history:  - FHx: no pertinent positives  - Tobacco: uses vape; quit cigarettes in 2020 - Alcohol: occasional use   O:  Vitals:   05/05/20 1437  BP: (!) 126/92  Pulse: 99    Home BP readings: takes occasionally. Reports that it's been good.   Last 3 Office BP readings: BP Readings from Last 3 Encounters:  05/05/20 (!) 126/92  04/21/20 130/88  12/22/19 118/82   BMET    Component Value Date/Time   NA 137 04/21/2020 1600   K 4.5 04/21/2020 1600   CL 99 04/21/2020 1600   CO2 24 04/21/2020 1600   GLUCOSE 84 04/21/2020 1600   GLUCOSE 128 (H) 12/22/2019 1231   BUN 10 04/21/2020 1600   CREATININE 0.72 04/21/2020 1600   CREATININE 0.82 05/24/2017 1135   CALCIUM 10.1 04/21/2020 1600   GFRNONAA 97 04/21/2020 1600   GFRNONAA 85 05/24/2017 1135   GFRAA 112 04/21/2020 1600   GFRAA 98 05/24/2017 1135   Renal function: CrCl cannot be calculated (Unknown ideal weight.).  Clinical ASCVD: No  The ASCVD Risk score Mikey Bussing DC Jr., et al., 2013) failed to calculate for the following reasons:   Cannot find a previous HDL lab   Cannot find a previous total cholesterol  lab   A/P: Hypertension longstanding currently above goal on current medications. BP Goal = < 130/80 mmHg. Medication adherence is appropriate with her amlodipine and HCTZ. I recommend to restart her metoprolol. Will hold off on additional changes as she sees Cardiology next month.  -Restarted metoprolol succinate 25 mg daily. -Continue amlodipine, HCTZ.   -Counseled on lifestyle modifications for blood pressure control including reduced dietary sodium, increased exercise, adequate sleep.  Results reviewed and written information provided.   Total time in face-to-face counseling 15 minutes.   F/U Clinic Visit in 15.   Benard Halsted, PharmD, [place] [phone]

## 2020-06-09 MED FILL — METOPROLOL SUCCINATE ER 25: 25 | 30 days supply | Qty: 30 | Fill #1

## 2020-06-09 MED FILL — AMLODIPINE BESYLATE 10 MG T: 10 | 30 days supply | Qty: 30 | Fill #1

## 2020-06-09 MED FILL — ESTRADIOL 2 MG TABLET: 2 | 30 days supply | Qty: 30 | Fill #8

## 2020-06-09 MED FILL — HYDROCHLOROTHIAZIDE 25 MG T: 25 | 30 days supply | Qty: 30 | Fill #1

## 2020-06-15 ENCOUNTER — Other Ambulatory Visit: Payer: Self-pay

## 2020-06-15 ENCOUNTER — Ambulatory Visit (INDEPENDENT_AMBULATORY_CARE_PROVIDER_SITE_OTHER): Payer: Self-pay | Admitting: Cardiovascular Disease

## 2020-06-15 ENCOUNTER — Encounter: Payer: Self-pay | Admitting: Cardiovascular Disease

## 2020-06-15 VITALS — BP 116/92 | HR 80 | Ht 65.5 in | Wt 150.0 lb

## 2020-06-15 DIAGNOSIS — I10 Essential (primary) hypertension: Secondary | ICD-10-CM

## 2020-06-15 DIAGNOSIS — R002 Palpitations: Secondary | ICD-10-CM | POA: Insufficient documentation

## 2020-06-15 LAB — BASIC METABOLIC PANEL
BUN/Creatinine Ratio: 16 (ref 9–23)
BUN: 12 mg/dL (ref 6–24)
CO2: 21 mmol/L (ref 20–29)
Calcium: 10.3 mg/dL — ABNORMAL HIGH (ref 8.7–10.2)
Chloride: 98 mmol/L (ref 96–106)
Creatinine, Ser: 0.73 mg/dL (ref 0.57–1.00)
GFR calc Af Amer: 110 mL/min/{1.73_m2} (ref >59)
GFR calc non Af Amer: 96 mL/min/{1.73_m2} (ref >59)
Glucose: 94 mg/dL (ref 65–99)
Potassium: 4.1 mmol/L (ref 3.5–5.2)
Sodium: 136 mmol/L (ref 134–144)

## 2020-06-15 NOTE — Progress Notes (Signed)
Cardiology Office Note:    Date:  06/15/2020   ID:  Becky Koch, DOB Sep 08, 1970, MRN [id number]  PCP:  Antony Blackbird, MD  [facility] Cardiologist:  New to [name]  [place] Electrophysiologist:  None   Referring MD: Ladell Pier, MD   Chief Complaint  Patient presents with  . Palpitations    Oct. 27, 2021   Becky Koch is a 49 y.o. female with a hx of HTN.  We were asked to see her today for further evaluation of her palpitations by Dr. Wynetta Emery.  During her last physical exam, she had a fast HR .  Has palpitations daily, last for a few seconds.  Are associated with some dizziness / light headedness Can occur with activity and at rest.  Snacks throughout the day , drinks water ,  Fairly healthy snacks.   Eats a light breakfast/ lunch  Drinks Boost daily .   Does not have a good appetite.  Has lost weight over the past  Was 128 lbs, Now has gained up to 150 lbs.  She was hospitalized last year with a small bowel obstruction.  She is had trouble keeping on weight since that time.  She started eating a little bit better over the past month and has gained from 128 up to 150 pounds.  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   Does not exercise ,     Past Medical History:  Diagnosis Date  . Allergy   . Anemia   . Blood transfusion without reported diagnosis   . Fibroid, uterine   . GERD (gastroesophageal reflux disease)   . Hypertension     Past Surgical History:  Procedure Laterality Date  . LAPAROSCOPIC LYSIS OF ADHESIONS N/A 08/03/2019   Procedure: Laparoscopic Lysis Of Adhesions;  Surgeon: [name], MD;  Location: [place];  Service: General;  Laterality: N/A;  . LAPAROSCOPY N/A 08/03/2019   Procedure: LAPAROSCOPY DIAGNOSTIC;  Surgeon: [name], MD;  Location: [place];  Service: General;  Laterality: N/A;  . TUBAL LIGATION    . VAGINAL HYSTERECTOMY Bilateral 07/28/2019   Procedure: HYSTERECTOMY VAGINAL WITH  BILATERAL SALPINGECTOMY;  Surgeon: Emily Filbert, MD;  Location: [place];  Service: Gynecology;  Laterality: Bilateral;    Current Medications: Current Meds  Medication Sig  . amLODipine (NORVASC) 10 MG tablet Take 1 tablet (10 mg total) by mouth daily. Must have office visit for refills  . [facility] POTASSIUM GLUCONATE PO Take 1 tablet by mouth every other day.  . estradiol (ESTRACE) 2 MG tablet Take 1 tablet (2 mg total) by mouth daily.  . hydrochlorothiazide (HYDRODIURIL) 25 MG tablet TAKE 1 TABLET BY MOUTH DAILY. Must have office visit for refills  . metoprolol succinate (TOPROL XL) 25 MG 24 hr tablet Take 1 tablet (25 mg total) by mouth daily.  Marland Kitchen omeprazole (PRILOSEC) 20 MG capsule Take 1 capsule (20 mg total) by mouth daily. Must have office visit for refills  . [DISCONTINUED] estrogens, conjugated, (PREMARIN) 0.45 MG tablet Take 1 tablet (0.45 mg total) by mouth daily. Take daily for 21 days then do not take for 7 days.     Allergies:   Patient has no known allergies.   Social History   Socioeconomic History  . Marital status: Legally Separated    Spouse name: Not on file  . Number of children: Not on file  . Years of education: Not on file  . Highest education  level: Not on file  Occupational History  . Not on file  Tobacco Use  . Smoking status: Current Every Day Smoker    Packs/day: 0.50    Types: E-cigarettes    Last attempt to quit: 03/21/2019    Years since quitting: 1.2  . Smokeless tobacco: Never Used  Vaping Use  . Vaping Use: Never used  Substance and Sexual Activity  . Alcohol use: Yes    Comment: social alcohol  . Drug use: No  . Sexual activity: Yes    Birth control/protection: Surgical  Other Topics Concern  . Not on file  Social History Narrative  . Not on file   Social Determinants of Health   Financial Resource Strain:   . Difficulty of Paying Living Expenses: Not on file  Food Insecurity:   . Worried About Charity fundraiser in  the Last Year: Not on file  . Ran Out of Food in the Last Year: Not on file  Transportation Needs:   . Lack of Transportation (Medical): Not on file  . Lack of Transportation (Non-Medical): Not on file  Physical Activity:   . Days of Exercise per Week: Not on file  . Minutes of Exercise per Session: Not on file  Stress:   . Feeling of Stress : Not on file  Social Connections:   . Frequency of Communication with Friends and Family: Not on file  . Frequency of Social Gatherings with Friends and Family: Not on file  . Attends Religious Services: Not on file  . Active Member of Clubs or Organizations: Not on file  . Attends Archivist Meetings: Not on file  . Marital Status: Not on file     Family History: The patient's family history includes Cancer in her paternal grandfather. There is no history of Colon cancer, Colon polyps, Esophageal cancer, Rectal cancer, or Stomach cancer.  ROS:   Please see the history of present illness.     All other systems reviewed and are negative.  EKGs/Labs/Other Studies Reviewed:    The following studies were reviewed today:   EKG:  Sept, 2. 2021:  NSR ,  NS ST abn.   Recent Labs: 08/02/2019: ALT 36 12/22/2019: Hemoglobin 14.0; Magnesium 2.0; Platelets 382 04/21/2020: BUN 10; Creatinine, Ser 0.72; Potassium 4.5; Sodium 137; TSH 2.060  Recent Lipid Panel    Component Value Date/Time   CHOL 152 06/21/2016 0952   TRIG 111 06/21/2016 0952   HDL 35 (L) 06/21/2016 0952   CHOLHDL 4.3 06/21/2016 0952   VLDL 22 06/21/2016 0952   LDLCALC 95 06/21/2016 0952     Risk Assessment/Calculations:       Physical Exam:    VS:  BP (!) 116/92   Pulse 80   Ht 5' 5.5" (1.664 m)   Wt 150 lb (68 kg)   LMP 07/24/2019   SpO2 98%   BMI 24.58 kg/m     Wt Readings from Last 3 Encounters:  06/15/20 150 lb (68 kg)  04/21/20 150 lb (68 kg)  12/22/19 140 lb (63.5 kg)     GEN:  Well nourished, well developed in no acute distress HEENT:  Normal NECK: No JVD; No carotid bruits LYMPHATICS: No lymphadenopathy CARDIAC: RRR, no murmurs, rubs, gallops RESPIRATORY:  Clear to auscultation without rales, wheezing or rhonchi  ABDOMEN: Soft, non-tender, non-distended MUSCULOSKELETAL:  No edema; No deformity  SKIN: Warm and dry NEUROLOGIC:  Alert and oriented x 3 PSYCHIATRIC:  Normal affect   ASSESSMENT:  1. Primary hypertension   2. Palpitations    PLAN:    In order of problems listed above:  1. Palpitations: She has intermittent palpitations.  It is unclear whether these are premature ventricular contractions or premature atrial contractions.  She is on HCTZ but is not on a potassium replacement.  She has had history of hypokalemia in the past.  We will measure a basic metabolic profile today.  I anticipate starting her on potassium supplement following getting the results of the BMP.  I encouraged her to eat and drink better.  Right now she does not eat much of the meal and snacks through the day and supplements with boost.  Have encouraged her to eat fresh fruits and vegetables and lean meats.  2.  Hypertension: Her blood pressure is well controlled on amlodipine and HCTZ.  I am concerned about her potassium levels.  We will measure a basic metabolic profile today  I will see her again in 6 months.   Medication Adjustments/Labs and Tests Ordered: Current medicines are reviewed at length with the patient today.  Concerns regarding medicines are outlined above.  Orders Placed This Encounter  Procedures  . Basic metabolic panel   No orders of the defined types were placed in this encounter.   Patient Instructions  Medication Instructions:  NO CHANGES *If you need a refill on your cardiac medications before your next appointment, please call your pharmacy*   Lab Work: TODAY BMET If you have labs (blood work) drawn today and your tests are completely normal, you will receive your results only by: Marland Kitchen MyChart Message  (if you have MyChart) OR . A paper copy in the mail If you have any lab test that is abnormal or we need to change your treatment, we will call you to review the results.   Testing/Procedures: NONE   Follow-Up: At [facility], you and your health needs are our priority.  As part of our continuing mission to provide you with exceptional heart care, we have created designated Provider Care Teams.  These Care Teams include your primary Cardiologist (physician) and Advanced Practice Providers (APPs -  Physician Assistants and Nurse Practitioners) who all work together to provide you with the care you need, when you need it.  We recommend signing up for the patient portal called "MyChart".  Sign up information is provided on this After Visit Summary.  MyChart is used to connect with patients for Virtual Visits (Telemedicine).  Patients are able to view lab/test results, encounter notes, upcoming appointments, etc.  Non-urgent messages can be sent to your provider as well.   To learn more about what you can do with MyChart, go to [email or web address].    Your next appointment:   6 month(s)  The format for your next appointment:   In Person  Provider:   Mertie Moores, MD   Other Instructions      Signed, Mertie Moores, MD  06/15/2020 9:27 AM    [place]

## 2020-06-15 NOTE — Patient Instructions (Signed)
Medication Instructions:  NO CHANGES *If you need a refill on your cardiac medications before your next appointment, please call your pharmacy*   Lab Work: TODAY BMET If you have labs (blood work) drawn today and your tests are completely normal, you will receive your results only by:  [place] (if you have MyChart) OR  A paper copy in the mail If you have any lab test that is abnormal or we need to change your treatment, we will call you to review the results.   Testing/Procedures: NONE   Follow-Up: At [facility], you and your health needs are our priority.  As part of our continuing mission to provide you with exceptional heart care, we have created designated Provider Care Teams.  These Care Teams include your primary Cardiologist (physician) and Advanced Practice Providers (APPs -  Physician Assistants and Nurse Practitioners) who all work together to provide you with the care you need, when you need it.  We recommend signing up for the patient portal called "MyChart".  Sign up information is provided on this After Visit Summary.  MyChart is used to connect with patients for Virtual Visits (Telemedicine).  Patients are able to view lab/test results, encounter notes, upcoming appointments, etc.  Non-urgent messages can be sent to your provider as well.   To learn more about what you can do with MyChart, go to [email or web address].    Your next appointment:   6 month(s)  The format for your next appointment:   In Person  Provider:   Mertie Moores, MD   Other Instructions

## 2020-07-18 MED FILL — ESTRADIOL 2 MG TABLET: 2 | 30 days supply | Qty: 30 | Fill #9

## 2020-07-18 MED FILL — METOPROLOL SUCCINATE ER 25: 25 | 30 days supply | Qty: 30 | Fill #2

## 2020-07-18 MED FILL — AMLODIPINE BESYLATE 10 MG T: 10 | 30 days supply | Qty: 30 | Fill #2

## 2020-07-18 MED FILL — HYDROCHLOROTHIAZIDE 25 MG T: 25 | 30 days supply | Qty: 30 | Fill #2

## 2020-07-29 ENCOUNTER — Ambulatory Visit: Payer: Self-pay | Admitting: Family Medicine

## 2020-08-10 ENCOUNTER — Ambulatory Visit: Payer: Self-pay | Admitting: Physician Assistant

## 2020-08-18 ENCOUNTER — Other Ambulatory Visit: Payer: Self-pay | Admitting: Internal Medicine

## 2020-08-18 DIAGNOSIS — I10 Essential (primary) hypertension: Secondary | ICD-10-CM

## 2020-08-18 MED FILL — HYDROCHLOROTHIAZIDE 25 MG T: 25 | 30 days supply | Qty: 30 | Fill #0

## 2020-08-18 MED FILL — AMLODIPINE BESYLATE 10 MG T: 10 | 30 days supply | Qty: 30 | Fill #0

## 2020-08-18 MED FILL — ESTRADIOL 2 MG TABLET: 2 | 30 days supply | Qty: 30 | Fill #10

## 2020-08-18 MED FILL — METOPROLOL SUCCINATE ER 25: 25 | 30 days supply | Qty: 30 | Fill #0

## 2020-08-26 MED FILL — AMLODIPINE BESYLATE 10 MG T: 10 | 30 days supply | Qty: 30 | Fill #0

## 2020-08-26 MED FILL — METOPROLOL SUCCINATE ER 25: 25 | 30 days supply | Qty: 30 | Fill #0

## 2020-08-26 MED FILL — HYDROCHLOROTHIAZIDE 25 MG T: 25 | 30 days supply | Qty: 30 | Fill #0

## 2020-08-30 MED FILL — ESTRADIOL 2 MG TABLET: 2 | 30 days supply | Qty: 30 | Fill #10

## 2020-09-01 ENCOUNTER — Other Ambulatory Visit: Payer: Self-pay

## 2020-09-01 ENCOUNTER — Encounter: Payer: Self-pay | Admitting: Physician Assistant

## 2020-09-01 ENCOUNTER — Ambulatory Visit: Payer: Self-pay | Attending: Family Medicine | Admitting: Physician Assistant

## 2020-09-01 ENCOUNTER — Other Ambulatory Visit: Payer: Self-pay | Admitting: Physician Assistant

## 2020-09-01 DIAGNOSIS — G4700 Insomnia, unspecified: Secondary | ICD-10-CM

## 2020-09-01 DIAGNOSIS — R222 Localized swelling, mass and lump, trunk: Secondary | ICD-10-CM

## 2020-09-01 MED ORDER — MIRTAZAPINE 15 MG PO TABS: 15.0000 mg | ORAL_TABLET | Freq: Every day | ORAL | 2 refills | Status: DC

## 2020-09-01 MED FILL — MIRTAZAPINE 15 MG TABLET: 15 | 30 days supply | Qty: 30 | Fill #0

## 2020-09-01 NOTE — Progress Notes (Signed)
Virtual Visit via Telephone Note  I connected with [name] on 09/01/20 at  9:10 AM EST by telephone and verified that I am speaking with the correct person using two identifiers.  Location: Patient: Becky Koch Provider: Freeman Caldron, PA-C   I discussed the limitations, risks, security and privacy concerns of performing an evaluation and management service by telephone and the availability of in person appointments. I also discussed with the patient that there may be a patient responsible charge related to this service. The patient expressed understanding and agreed to proceed.  PATIENT visit by telephone virtually in the context of Covid-19 pandemic. Patient location:  home My Location:  [facility] office Persons on the call: screened by Francisco Capuchin, me, and the patient  History of Present Illness:  Patient wants to get a tetanus shot.   Also, she used to be on remeron for sleep and it helped with anxiety-She would like to resume taking this.   She has noticed a little superficial lump beneath her left ribs.  Not painful.  CT abdomen/pelvis and xrays 07/2019 essentially unremarkable.  No change in appetite or BMs.  No N/V/D   Last labs sept/Oct 2021 and essentially WNL    Observations/Objective: NAD.  A&Ox3  Assessment and Plan: 1. Insomnia, unspecified type resume - mirtazapine (REMERON) 15 MG tablet; Take 1 tablet (15 mg total) by mouth at bedtime.  Dispense: 30 tablet; Refill: 2  2. Subcutaneous mass of abdominal wall Will schedule next available in person appt with any provider to have this assessed for next steps but reassuring that CT and xrays and CT were negative just a year ago(she did not mention this issue when she scheduled appt.)    Follow Up Instructions: Next available in person appt and schedule for Tdap next week   I discussed the assessment and treatment plan with the patient. The patient was provided an opportunity to ask questions and all were answered. The  patient agreed with the plan and demonstrated an understanding of the instructions.   The patient was advised to call back or seek an in-person evaluation if the symptoms worsen or if the condition fails to improve as anticipated.  I provided 22 minutes of non-face-to-face time during this encounter.   Freeman Caldron, PA-C  Patient ID: Becky Koch, female   DOB: 17-Sep-1970, 50 y.o.   MRN: [id number]

## 2020-09-29 ENCOUNTER — Other Ambulatory Visit: Payer: Self-pay | Admitting: Internal Medicine

## 2020-09-29 DIAGNOSIS — I10 Essential (primary) hypertension: Secondary | ICD-10-CM

## 2020-09-29 MED FILL — METOPROLOL SUCCINATE ER 25: 25 | 30 days supply | Qty: 30 | Fill #1

## 2020-09-29 MED FILL — AMLODIPINE BESYLATE 10 MG T: 10 | 30 days supply | Qty: 30 | Fill #0

## 2020-09-29 MED FILL — HYDROCHLOROTHIAZIDE 25 MG T: 25 | 30 days supply | Qty: 30 | Fill #0

## 2020-09-29 MED FILL — MIRTAZAPINE 15 MG TABLET: 15 | 30 days supply | Qty: 30 | Fill #1

## 2020-09-29 NOTE — Telephone Encounter (Signed)
Requested medication (s) are due for refill today: historical provider   Requested medication (s) are on the active medication list: yes  Last refill:  09/02/19 #90 6 refills   Future visit scheduled: no  Notes to clinic:  last seen 1 year ago by Clovia Cuff, MD - historical provider.      Requested Prescriptions  Pending Prescriptions Disp Refills   estradiol (ESTRACE) 2 MG tablet [Pharmacy Med Name: ESTRADIOL 2 MG TABLET 2 Tablet] 30 tablet 6    Sig: TAKE 1 TABLET (2 MG TOTAL) BY MOUTH DAILY.      OB/GYN:  Estrogens Failed - 09/29/2020 11:41 AM      Failed - Last BP in normal range    BP Readings from Last 1 Encounters:  06/15/20 (!) 116/92          Passed - Mammogram is up-to-date per Health Maintenance      Passed - Valid encounter within last 12 months    Recent Outpatient Visits           4 weeks ago Insomnia, unspecified type   [place], [place], [place]   4 months ago Essential hypertension   [place], Jarome Matin, RPH-CPP   5 months ago Essential hypertension   [place] Ladell Pier, MD   1 year ago Urinary frequency   [facility] Fulp, [place], MD   1 year ago Essential hypertension   [place] [facility] Antony Blackbird, MD

## 2020-09-30 MED FILL — ESTRADIOL 2 MG TABLET: 2 | 30 days supply | Qty: 30 | Fill #0

## 2020-11-07 ENCOUNTER — Other Ambulatory Visit: Payer: Self-pay | Admitting: Internal Medicine

## 2020-11-07 DIAGNOSIS — I10 Essential (primary) hypertension: Secondary | ICD-10-CM

## 2020-11-07 MED FILL — ESTRADIOL 2 MG TABLET: 2 | 30 days supply | Qty: 30 | Fill #1

## 2020-11-07 MED FILL — METOPROLOL SUCCINATE ER 25: 25 | 30 days supply | Qty: 30 | Fill #2

## 2020-11-07 MED FILL — MIRTAZAPINE 15 MG TABLET: 15 | 30 days supply | Qty: 30 | Fill #2

## 2020-11-07 NOTE — Telephone Encounter (Signed)
Requested medication (s) are due for refill today:no  Requested medication (s) are on the active medication list: yes  Last refill:  09/29/2020  Future visit scheduled: no  Notes to clinic: Patient has still not been seen for blood pressure     Requested Prescriptions  Pending Prescriptions Disp Refills   hydrochlorothiazide (HYDRODIURIL) 25 MG tablet [Pharmacy Med Name: HYDROCHLOROTHIAZIDE 25 MG T 25 Tablet] 30 tablet 0    Sig: TAKE 1 TABLET BY MOUTH DAILY. MUST HAVE OFFICE VISIT FOR REFILLS      Cardiovascular: Diuretics - Thiazide Failed - 11/07/2020 11:44 AM      Failed - Ca in normal range and within 360 days    Calcium  Date Value Ref Range Status  06/15/2020 10.3 (H) 8.7 - 10.2 mg/dL Final          Failed - Last BP in normal range    BP Readings from Last 1 Encounters:  06/15/20 (!) 116/92          Passed - Cr in normal range and within 360 days    Creat  Date Value Ref Range Status  05/24/2017 0.82 0.50 - 1.10 mg/dL Final   Creatinine, Ser  Date Value Ref Range Status  06/15/2020 0.73 0.57 - 1.00 mg/dL Final          Passed - K in normal range and within 360 days    Potassium  Date Value Ref Range Status  06/15/2020 4.1 3.5 - 5.2 mmol/L Final          Passed - Na in normal range and within 360 days    Sodium  Date Value Ref Range Status  06/15/2020 136 134 - 144 mmol/L Final          Passed - Valid encounter within last 6 months    Recent Outpatient Visits           2 months ago Insomnia, unspecified type   [place], [place], [place]   6 months ago Essential hypertension   [place], RPH-CPP   6 months ago Essential hypertension   [place] Ladell Pier, MD   1 year ago Urinary frequency   [place] Fulp, [place], MD   1 year ago Essential hypertension   [place], MD                  amLODipine (NORVASC) 10 MG tablet [Pharmacy Med Name: AMLODIPINE BESYLATE 10 MG T 10 Tablet] 30 tablet 0    Sig: TAKE 1 TABLET (10 MG TOTAL) BY MOUTH DAILY. MUST HAVE OFFICE VISIT FOR REFILLS      Cardiovascular:  Calcium Channel Blockers Failed - 11/07/2020 11:44 AM      Failed - Last BP in normal range    BP Readings from Last 1 Encounters:  06/15/20 (!) 116/92          Passed - Valid encounter within last 6 months    Recent Outpatient Visits           2 months ago Insomnia, unspecified type   [place], [place]   6 months ago Essential hypertension   [place], Jarome Matin, RPH-CPP   6 months ago Essential hypertension   [facility] Ladell Pier, MD   1 year ago Urinary frequency   [place] [facility] Fulp, [place], MD   1 year ago Essential hypertension   [facility] [facility] [place], [place], MD

## 2020-11-08 ENCOUNTER — Other Ambulatory Visit: Payer: Self-pay | Admitting: Family Medicine

## 2020-11-08 MED FILL — HYDROCHLOROTHIAZIDE 25 MG T: 25 | 30 days supply | Qty: 30 | Fill #0

## 2020-11-08 MED FILL — AMLODIPINE BESYLATE 10 MG T: 10 | 30 days supply | Qty: 30 | Fill #0

## 2020-12-06 IMAGING — MG DIGITAL SCREENING BILATERAL MAMMOGRAM WITH TOMO AND CAD
8 series · 8 of 24 positions shown · non-contrast
Comparison: Previous exam(s).

CLINICAL DATA: Screening.

EXAM:
DIGITAL SCREENING BILATERAL MAMMOGRAM WITH TOMO AND CAD

[L CC synth-2D]
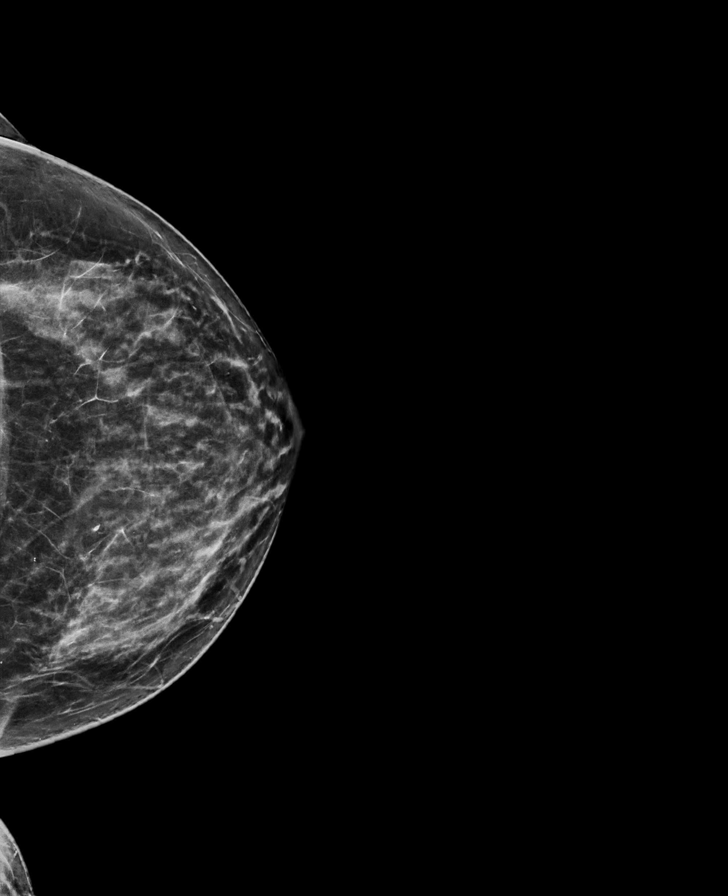

[R CC synth-2D]
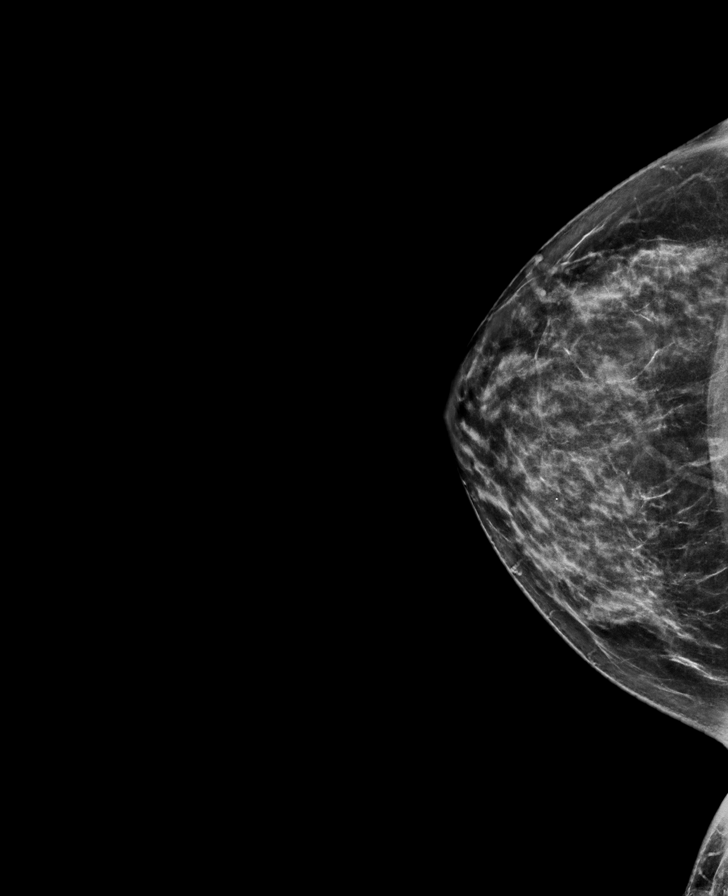

[R MLO synth-2D]
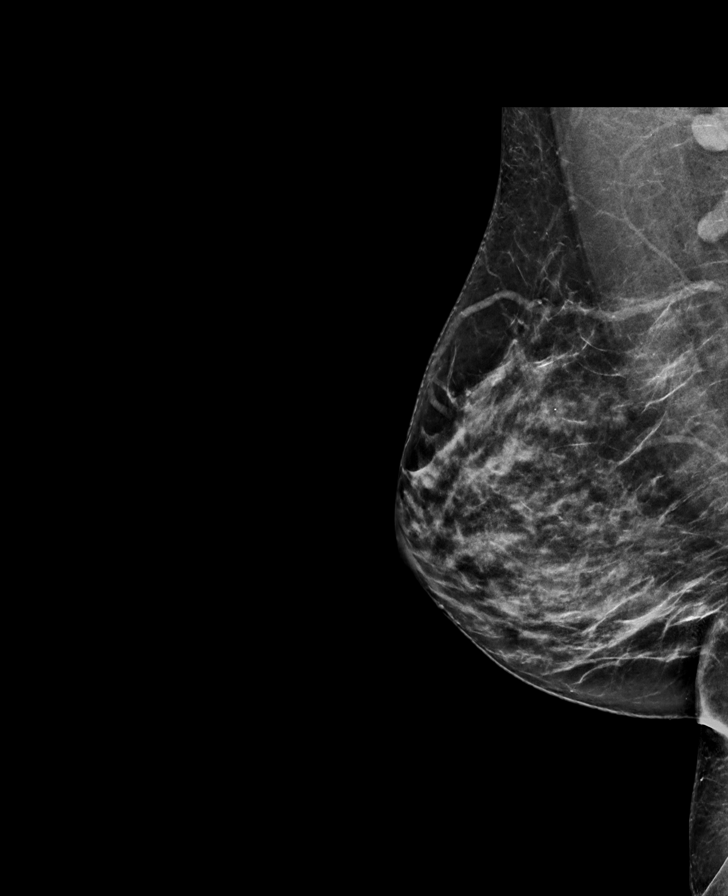

[L MLO synth-2D]
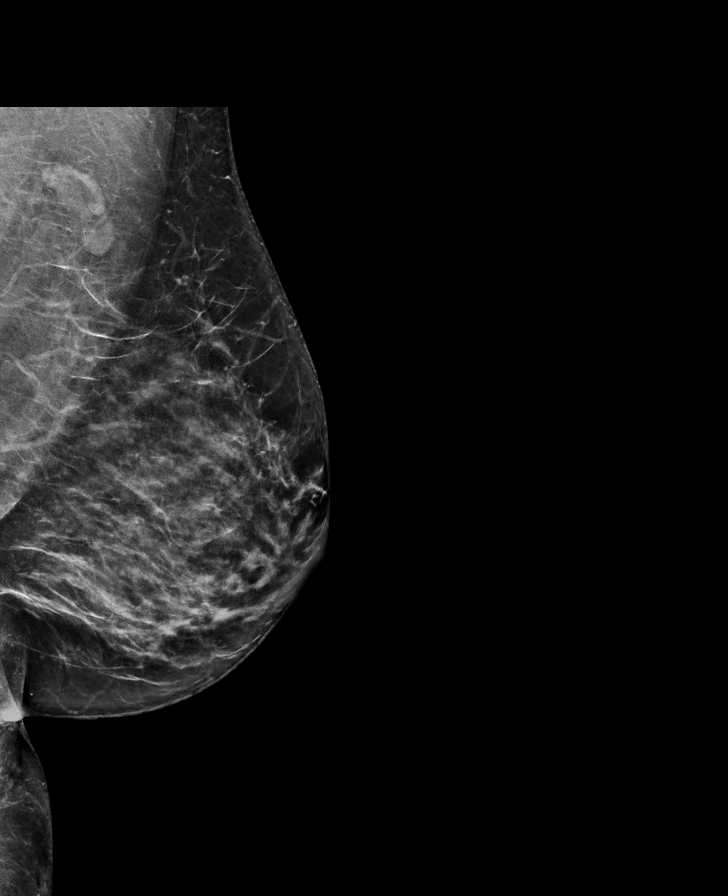

[L MLO tomo · tomo slice 39/76.0]
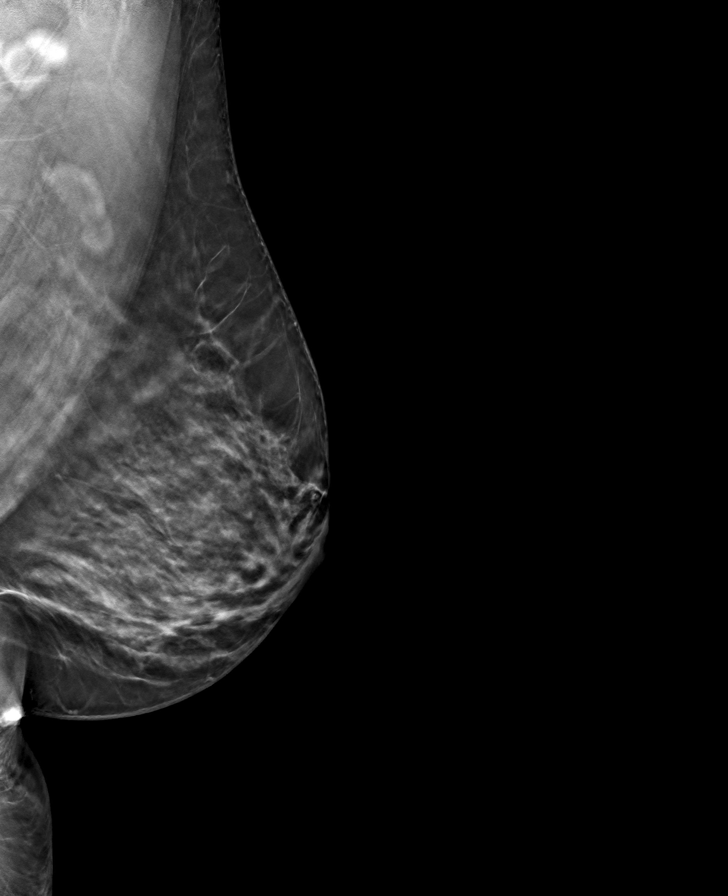

[R CC tomo · tomo slice 33/66.0]
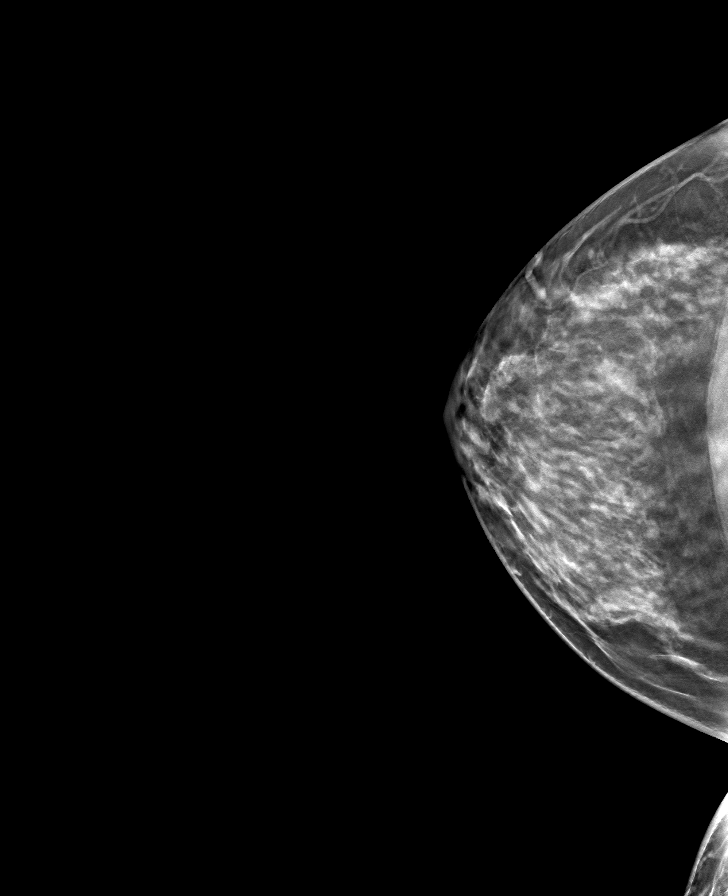

[L CC tomo · tomo slice 35/68.0]
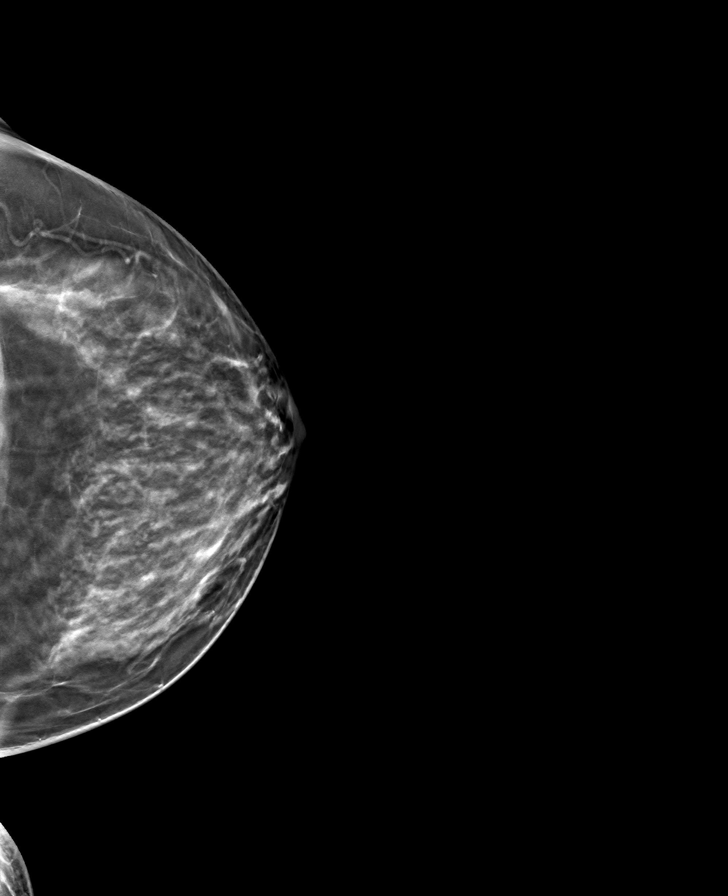

[R MLO tomo · tomo slice 35/70.0]
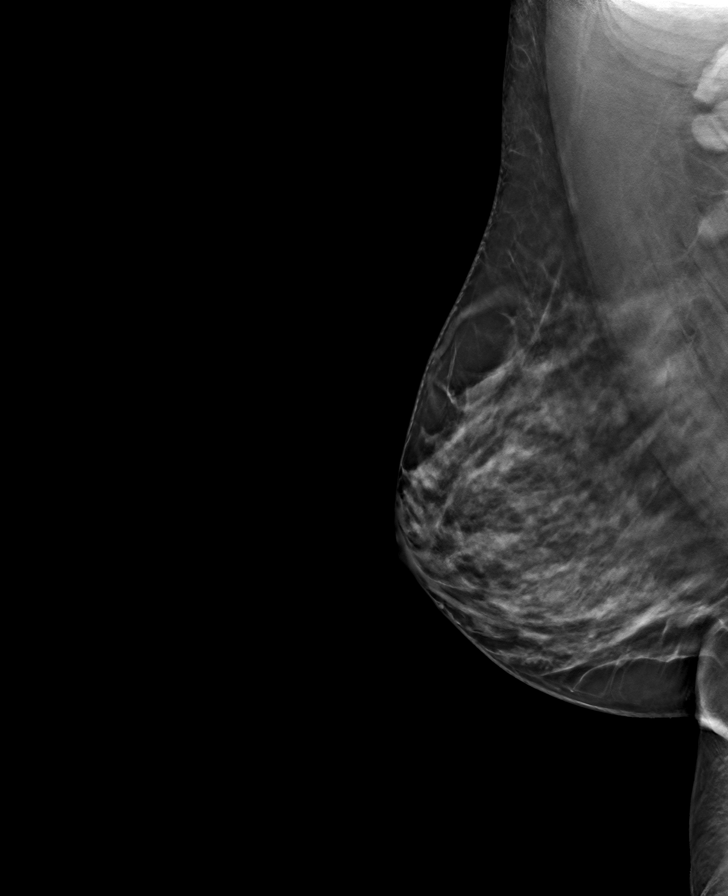

[8 of 24 positions shown; findings below may reference images not displayed]

ACR Breast Density Category c: The breast tissue is heterogeneously
dense, which may obscure small masses.
FINDINGS: There are no findings suspicious for malignancy. Images were
processed with CAD.
IMPRESSION: No mammographic evidence of malignancy. A result letter of this
screening mammogram will be mailed directly to the patient.

RECOMMENDATION:
Screening mammogram in one year. (Code:[id number])

BI-RADS CATEGORY  1: Negative.

## 2020-12-15 ENCOUNTER — Other Ambulatory Visit: Payer: Self-pay | Admitting: Family Medicine

## 2020-12-15 ENCOUNTER — Other Ambulatory Visit: Payer: Self-pay

## 2020-12-15 ENCOUNTER — Other Ambulatory Visit: Payer: Self-pay | Admitting: Physician Assistant

## 2020-12-15 ENCOUNTER — Other Ambulatory Visit: Payer: Self-pay | Admitting: Internal Medicine

## 2020-12-15 DIAGNOSIS — I10 Essential (primary) hypertension: Secondary | ICD-10-CM

## 2020-12-15 DIAGNOSIS — G4700 Insomnia, unspecified: Secondary | ICD-10-CM

## 2020-12-15 MED FILL — Estradiol Tab 2 MG: ORAL | 30 days supply | Qty: 30 | Fill #0 | Status: AC

## 2020-12-15 NOTE — Telephone Encounter (Signed)
  Notes to clinic:  courtesy refill has already been given  No appt has been schedule for BP    Requested Prescriptions  Pending Prescriptions Disp Refills   metoprolol succinate (TOPROL-XL) 25 MG 24 hr tablet 30 tablet 2    Sig: TAKE 1 TABLET (25 MG TOTAL) BY MOUTH DAILY.      Cardiovascular:  Beta Blockers Failed - 12/15/2020  3:12 PM      Failed - Last BP in normal range    BP Readings from Last 1 Encounters:  06/15/20 (!) 116/92          Passed - Last Heart Rate in normal range    Pulse Readings from Last 1 Encounters:  06/15/20 80          Passed - Valid encounter within last 6 months    Recent Outpatient Visits           3 months ago Insomnia, unspecified type   [place], [place], [place]   7 months ago Essential hypertension   [place], Jarome Matin, RPH-CPP   7 months ago Essential hypertension   [facility] Ladell Pier, MD   1 year ago Urinary frequency   [place] [facility] [place], [place], MD   1 year ago Essential hypertension   [facility] [place], Ander G[place], MD

## 2020-12-15 NOTE — Telephone Encounter (Signed)
   Notes to clinic: Patient due for labs  Had a tele visit in 08/2020 but needs to get labs done  Message has been sent to patient   Requested Prescriptions  Pending Prescriptions Disp Refills   mirtazapine (REMERON) 15 MG tablet 30 tablet 2    Sig: TAKE 1 TABLET (15 MG TOTAL) BY MOUTH AT BEDTIME.      Psychiatry: Antidepressants - mirtazapine Failed - 12/15/2020  3:12 PM      Failed - AST in normal range and within 360 days    AST  Date Value Ref Range Status  08/02/2019 38 15 - 41 U/L Final          Failed - ALT in normal range and within 360 days    ALT  Date Value Ref Range Status  08/02/2019 36 0 - 44 U/L Final          Failed - Triglycerides in normal range and within 360 days    Triglycerides  Date Value Ref Range Status  06/21/2016 111 <150 mg/dL Final          Failed - Total Cholesterol in normal range and within 360 days    Cholesterol  Date Value Ref Range Status  06/21/2016 152 125 - 200 mg/dL Final          Passed - WBC in normal range and within 360 days    WBC  Date Value Ref Range Status  12/22/2019 4.5 4.0 - 10.5 K/uL Final          Passed - Valid encounter within last 6 months    Recent Outpatient Visits           3 months ago Insomnia, unspecified type   [place], [place], [place]   7 months ago Essential hypertension   [place], Jarome Matin, RPH-CPP   7 months ago Essential hypertension   [place] Ladell Pier, MD   1 year ago Urinary frequency   [facility] [facility] [place], [place], MD   1 year ago Essential hypertension   [place], [place], MD

## 2020-12-16 ENCOUNTER — Other Ambulatory Visit: Payer: Self-pay

## 2020-12-16 MED ORDER — MIRTAZAPINE 15 MG PO TABS
ORAL_TABLET | Freq: Every day | ORAL | 0 refills | Status: DC
  Filled 2020-12-16: qty 30, 30d supply, fill #0

## 2020-12-16 MED ORDER — METOPROLOL SUCCINATE ER 25 MG PO TB24
25.0000 mg | ORAL_TABLET | Freq: Every day | ORAL | 0 refills | Status: DC
  Filled 2020-12-16: qty 30, 30d supply, fill #0

## 2020-12-19 ENCOUNTER — Telehealth: Payer: Self-pay | Admitting: Family Medicine

## 2020-12-19 ENCOUNTER — Other Ambulatory Visit: Payer: Self-pay

## 2020-12-19 NOTE — Telephone Encounter (Signed)
Called patient to schedule a sooner appt but voice mail is full and was not able to leave a message. If patient calls back please transfer the call to the office.

## 2020-12-19 NOTE — Telephone Encounter (Signed)
Pt medication was denied due to needing an appt. Pt has an appt with dr Margarita Rana on 02-22-2021 and would like a refill on amlodipine 10 mg and hctz 25 mg sent to [facility] phone number [phone]

## 2020-12-20 ENCOUNTER — Other Ambulatory Visit: Payer: Self-pay

## 2020-12-20 ENCOUNTER — Other Ambulatory Visit: Payer: Self-pay | Admitting: Family Medicine

## 2020-12-20 DIAGNOSIS — I10 Essential (primary) hypertension: Secondary | ICD-10-CM

## 2020-12-20 MED ORDER — HYDROCHLOROTHIAZIDE 25 MG PO TABS
ORAL_TABLET | ORAL | 1 refills | Status: DC
  Filled 2020-12-20: qty 30, 30d supply, fill #0
  Filled 2021-02-06: qty 30, 30d supply, fill #1

## 2020-12-20 MED ORDER — AMLODIPINE BESYLATE 10 MG PO TABS
ORAL_TABLET | Freq: Every day | ORAL | 1 refills | Status: DC
  Filled 2020-12-20: qty 30, 30d supply, fill #0
  Filled 2021-02-06: qty 30, 30d supply, fill #1

## 2020-12-20 NOTE — Telephone Encounter (Signed)
Call placed to patient and she was scheduled for an earlier appointment for 01/24/2021. Patient stated she was completely out of her medications. Can she get a courtesy refill to hold her over until her appt in June? Please advise.

## 2020-12-20 NOTE — Telephone Encounter (Signed)
Rxs sent

## 2020-12-20 NOTE — Telephone Encounter (Signed)
Requested medication (s) are due for refill today- yes  Requested medication (s) are on the active medication list -yes  Future visit scheduled -yes  Last refill: -11/08/20  Notes to clinic: See notes- patient has appointment 7/6- but provider wants to see her before that date. Patient is requesting RF- courtesy already given.  Requested Prescriptions  Pending Prescriptions Disp Refills   amLODipine (NORVASC) 10 MG tablet 30 tablet 0    Sig: TAKE 1 TABLET (10 MG TOTAL) BY MOUTH DAILY. MUST HAVE OFFICE VISIT FOR REFILLS      Cardiovascular:  Calcium Channel Blockers Failed - 12/20/2020  1:38 PM      Failed - Last BP in normal range    BP Readings from Last 1 Encounters:  06/15/20 (!) 116/92          Passed - Valid encounter within last 6 months    Recent Outpatient Visits           3 months ago Insomnia, unspecified type   [facility] [place], [place], [place]   7 months ago Essential hypertension   [place], Jarome Matin, RPH-CPP   8 months ago Essential hypertension   [place] Ladell Pier, MD   1 year ago Urinary frequency   [place] Fulp, [place], MD   1 year ago Essential hypertension   [place], MD       Future Appointments             In 2 months Charlott Rakes, MD [place]               hydrochlorothiazide (HYDRODIURIL) 25 MG tablet 30 tablet 0    Sig: TAKE 1 TABLET BY MOUTH DAILY. MUST HAVE OFFICE VISIT FOR REFILLS      Cardiovascular: Diuretics - Thiazide Failed - 12/20/2020  1:38 PM      Failed - Ca in normal range and within 360 days    Calcium  Date Value Ref Range Status  06/15/2020 10.3 (H) 8.7 - 10.2 mg/dL Final          Failed - Last BP in normal range    BP Readings from Last 1 Encounters:  06/15/20 (!) 116/92           Passed - Cr in normal range and within 360 days    Creat  Date Value Ref Range Status  05/24/2017 0.82 0.50 - 1.10 mg/dL Final   Creatinine, Ser  Date Value Ref Range Status  06/15/2020 0.73 0.57 - 1.00 mg/dL Final          Passed - K in normal range and within 360 days    Potassium  Date Value Ref Range Status  06/15/2020 4.1 3.5 - 5.2 mmol/L Final          Passed - Na in normal range and within 360 days    Sodium  Date Value Ref Range Status  06/15/2020 136 134 - 144 mmol/L Final          Passed - Valid encounter within last 6 months    Recent Outpatient Visits           3 months ago Insomnia, unspecified type   [place], [place]   7 months ago Essential hypertension   [employer]   [place], Jarome Matin, RPH-CPP   8 months ago Essential hypertension   [place] Ladell Pier, MD   1 year ago Urinary frequency   [place] Fulp, [place], MD   1 year ago Essential hypertension   [place], MD       Future Appointments             In 2 months Charlott Rakes, MD [place]                 Requested Prescriptions  Pending Prescriptions Disp Refills   amLODipine (NORVASC) 10 MG tablet 30 tablet 0    Sig: TAKE 1 TABLET (10 MG TOTAL) BY MOUTH DAILY. MUST HAVE OFFICE VISIT FOR REFILLS      Cardiovascular:  Calcium Channel Blockers Failed - 12/20/2020  1:38 PM      Failed - Last BP in normal range    BP Readings from Last 1 Encounters:  06/15/20 (!) 116/92          Passed - Valid encounter within last 6 months    Recent Outpatient Visits           3 months ago Insomnia, unspecified type   [place], [place], [place]   7 months ago Essential hypertension   [place], Jarome Matin, RPH-CPP   8 months ago Essential hypertension   [place] Ladell Pier, MD   1 year ago Urinary frequency   [place] Fulp, [place], MD   1 year ago Essential hypertension   [place], MD       Future Appointments             In 2 months Charlott Rakes, MD [place]               hydrochlorothiazide (HYDRODIURIL) 25 MG tablet 30 tablet 0    Sig: TAKE 1 TABLET BY MOUTH DAILY. MUST HAVE OFFICE VISIT FOR REFILLS      Cardiovascular: Diuretics - Thiazide Failed - 12/20/2020  1:38 PM      Failed - Ca in normal range and within 360 days    Calcium  Date Value Ref Range Status  06/15/2020 10.3 (H) 8.7 - 10.2 mg/dL Final          Failed - Last BP in normal range    BP Readings from Last 1 Encounters:  06/15/20 (!) 116/92          Passed - Cr in normal range and within 360 days    Creat  Date Value Ref Range Status  05/24/2017 0.82 0.50 - 1.10 mg/dL Final   Creatinine, Ser  Date Value Ref Range Status  06/15/2020 0.73 0.57 - 1.00 mg/dL Final          Passed - K in normal range and within 360 days    Potassium  Date Value Ref Range Status  06/15/2020 4.1 3.5 - 5.2 mmol/L Final          Passed - Na in normal range and within 360 days    Sodium  Date Value Ref Range Status  06/15/2020 136 134 - 144 mmol/L Final  Passed - Valid encounter within last 6 months    Recent Outpatient Visits           3 months ago Insomnia, unspecified type   [place], [place], [place]   7 months ago Essential hypertension   [place], RPH-CPP   8 months ago Essential hypertension   [place] Ladell Pier, MD   1 year ago Urinary frequency   [place] Fulp, [place], MD   1 year ago Essential hypertension   [place], MD       Future Appointments             In 2 months Charlott Rakes, MD [place]

## 2020-12-21 ENCOUNTER — Other Ambulatory Visit: Payer: Self-pay

## 2020-12-21 NOTE — Telephone Encounter (Signed)
Patient notified

## 2020-12-27 ENCOUNTER — Other Ambulatory Visit: Payer: Self-pay

## 2021-01-24 ENCOUNTER — Other Ambulatory Visit: Payer: Self-pay

## 2021-01-24 ENCOUNTER — Ambulatory Visit: Payer: Self-pay | Attending: Family Medicine | Admitting: Family Medicine

## 2021-01-24 ENCOUNTER — Encounter: Payer: Self-pay | Admitting: Family Medicine

## 2021-01-24 DIAGNOSIS — K219 Gastro-esophageal reflux disease without esophagitis: Secondary | ICD-10-CM

## 2021-01-24 DIAGNOSIS — G4700 Insomnia, unspecified: Secondary | ICD-10-CM

## 2021-01-24 DIAGNOSIS — I10 Essential (primary) hypertension: Secondary | ICD-10-CM

## 2021-01-24 DIAGNOSIS — B353 Tinea pedis: Secondary | ICD-10-CM

## 2021-01-24 DIAGNOSIS — D1779 Benign lipomatous neoplasm of other sites: Secondary | ICD-10-CM

## 2021-01-24 MED ORDER — CLOTRIMAZOLE 1 % EX CREA
1.0000 | TOPICAL_CREAM | Freq: Two times a day (BID) | CUTANEOUS | 0 refills | Status: DC
Start: 2021-01-24 — End: 2021-07-19
  Filled 2021-01-24 – 2021-07-10 (×2): qty 30, 15d supply, fill #0

## 2021-01-24 MED ORDER — METOPROLOL SUCCINATE ER 25 MG PO TB24
25.0000 mg | ORAL_TABLET | Freq: Every day | ORAL | 1 refills | Status: DC
  Filled 2021-01-24: qty 30, 30d supply, fill #0
  Filled 2021-03-03: qty 30, 30d supply, fill #1

## 2021-01-24 MED ORDER — MIRTAZAPINE 15 MG PO TABS
ORAL_TABLET | Freq: Every day | ORAL | 1 refills | Status: DC
  Filled 2021-01-24: qty 30, 30d supply, fill #0
  Filled 2021-03-03: qty 30, 30d supply, fill #1

## 2021-01-24 MED ORDER — OMEPRAZOLE 20 MG PO CPDR
20.0000 mg | DELAYED_RELEASE_CAPSULE | Freq: Every day | ORAL | 3 refills | Status: DC
  Filled 2021-01-24: qty 30, 30d supply, fill #0
  Filled 2021-07-10: qty 30, 30d supply, fill #1
  Filled 2021-10-11: qty 30, 30d supply, fill #0

## 2021-01-24 NOTE — Progress Notes (Signed)
Virtual Visit via Telephone Note  I connected with Becky Koch, on 01/24/2021 at 11:17 AM by telephone due to the COVID-19 pandemic and verified that I am speaking with the correct person using two identifiers.   Consent: I discussed the limitations, risks, security and privacy concerns of performing an evaluation and management service by telephone and the availability of in person appointments. I also discussed with the patient that there may be a patient responsible charge related to this service. The patient expressed understanding and agreed to proceed.   Location of Patient: Home  Location of Provider: Clinic   Persons participating in Telemedicine visit: Becky Koch     History of Present Illness: Becky Koch is a 50 year old female patient of Dr. Wynetta Emery with a history of hypertension, insomnia seen for chronic disease management.  Last visit in the clinic was 9 months ago. Seen by cardiology in 05/2020 for intermittent palpitations and hypertension. She endorses compliance with her antihypertensive.   She would like to get labs done.She has a knot on her left side and and has a new one beneath it. Was initially was told old lump was a fatty tissue. This new lesion is hard, not tender, itches sometimes and is smaller than previous lesion and has been present for the last 6 months.  Has not increased in size. Complains of athletes foot and OTC creams are not working, her feet are peeling and her toes swell. Denies presence of chest pain or dyspnea. Reflux is controlled on her PPI and she is needing refill.  Past Medical History:  Diagnosis Date  . Allergy   . Anemia   . Blood transfusion without reported diagnosis   . Fibroid, uterine   . GERD (gastroesophageal reflux disease)   . Hypertension    No Known Allergies  Current Outpatient Medications on File Prior to Visit  Medication Sig Dispense Refill  . amLODipine (NORVASC) 10 MG tablet TAKE 1  TABLET (10 MG TOTAL) BY MOUTH DAILY. MUST HAVE OFFICE VISIT FOR REFILLS 30 tablet 1  . [facility] POTASSIUM GLUCONATE PO Take 1 tablet by mouth every other day.    . estradiol (ESTRACE) 2 MG tablet TAKE 1 TABLET (2 MG TOTAL) BY MOUTH DAILY. 30 tablet 2  . hydrochlorothiazide (HYDRODIURIL) 25 MG tablet TAKE 1 TABLET BY MOUTH DAILY. MUST HAVE OFFICE VISIT FOR REFILLS 30 tablet 1  . metoprolol succinate (TOPROL-XL) 25 MG 24 hr tablet Take 1 tablet (25 mg total) by mouth daily. 30 tablet 0  . mirtazapine (REMERON) 15 MG tablet TAKE 1 TABLET (15 MG TOTAL) BY MOUTH AT BEDTIME. 30 tablet 0  . omeprazole (PRILOSEC) 20 MG capsule Take 1 capsule (20 mg total) by mouth daily. Must have office visit for refills 30 capsule 3  . [DISCONTINUED] cetirizine (ZYRTEC) 10 MG tablet Take 1 tablet (10 mg total) by mouth daily. (Patient not taking: Reported on 09/15/2019) 30 tablet 11  . [DISCONTINUED] traZODone (DESYREL) 50 MG tablet Take 0.5-1 tablets (25-50 mg total) by mouth at bedtime as needed for sleep. (Patient not taking: Reported on 12/22/2019) 30 tablet 3   No current facility-administered medications on file prior to visit.    ROS: See HPI  Observations/Objective: Awake, alert, oriented x3 Not in acute distress Normal mood  Assessment and Plan: 1. Insomnia, unspecified type Stable - mirtazapine (REMERON) 15 MG tablet; TAKE 1 TABLET (15 MG TOTAL) BY MOUTH AT BEDTIME.  Dispense: 30 tablet; Refill: 1  2. 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 Essential hypertension Controlled from last office visit - CMP14+EGFR; Future - Lipid panel; Future - metoprolol succinate (TOPROL-XL) 25 MG 24 hr tablet; Take 1 tablet (25 mg total) by mouth daily.  Dispense: 30 tablet; Refill: 1  4. Tinea pedis of both feet - clotrimazole (LOTRIMIN) 1 % cream; Apply 1 application topically 2 (two) times daily.  Dispense: 30 g;  Refill: 0  5. Gastroesophageal reflux disease without esophagitis Stable - omeprazole (PRILOSEC) 20 MG capsule; Take 1 capsule (20 mg total) by mouth daily.  Dispense: 30 capsule; Refill: 3   Follow Up Instructions: Return in about 1 day (around 01/25/2021) for Fasting labs; 3 weeks with PCP to evaluate new lump.   I discussed the assessment and treatment plan with the patient. The patient was provided an opportunity to ask questions and all were answered. The patient agreed with the plan and demonstrated an understanding of the instructions.   The patient was advised to call back or seek an in-person evaluation if the symptoms worsen or if the condition fails to improve as anticipated.     I provided 12 minutes total of non-face-to-face time during this encounter.   Charlott Rakes, MD, FAAFP. [facility] [place], [place]   01/24/2021, 11:17 AM

## 2021-01-25 ENCOUNTER — Ambulatory Visit: Payer: Self-pay | Attending: Family Medicine

## 2021-01-25 ENCOUNTER — Other Ambulatory Visit: Payer: Self-pay

## 2021-01-25 ENCOUNTER — Other Ambulatory Visit: Payer: Self-pay | Admitting: Family Medicine

## 2021-01-25 DIAGNOSIS — I10 Essential (primary) hypertension: Secondary | ICD-10-CM

## 2021-01-26 LAB — CMP14+EGFR
ALT: 22 IU/L (ref 0–32)
AST: 39 IU/L (ref 0–40)
Albumin/Globulin Ratio: 1.4 (ref 1.2–2.2)
Albumin: 4.4 g/dL (ref 3.8–4.9)
Alkaline Phosphatase: 75 IU/L (ref 44–121)
BUN/Creatinine Ratio: 12 (ref 9–23)
BUN: 8 mg/dL (ref 6–24)
Bilirubin Total: <0.2 mg/dL (ref 0.0–1.2)
CO2: 21 mmol/L (ref 20–29)
Calcium: 9.7 mg/dL (ref 8.7–10.2)
Chloride: 104 mmol/L (ref 96–106)
Creatinine, Ser: 0.68 mg/dL (ref 0.57–1.00)
Globulin, Total: 3.2 g/dL (ref 1.5–4.5)
Glucose: 90 mg/dL (ref 65–99)
Potassium: 4.5 mmol/L (ref 3.5–5.2)
Sodium: 139 mmol/L (ref 134–144)
Total Protein: 7.6 g/dL (ref 6.0–8.5)
eGFR: 105 mL/min/{1.73_m2} (ref >59)

## 2021-01-26 LAB — LIPID PANEL
Chol/HDL Ratio: 4.1 ratio (ref 0.0–4.4)
Cholesterol, Total: 154 mg/dL (ref 100–199)
HDL: 38 mg/dL — ABNORMAL LOW (ref >39)
LDL Chol Calc (NIH): 68 mg/dL (ref 0–99)
Triglycerides: 301 mg/dL — ABNORMAL HIGH (ref 0–149)
VLDL Cholesterol Cal: 48 mg/dL — ABNORMAL HIGH (ref 5–40)

## 2021-01-30 ENCOUNTER — Other Ambulatory Visit: Payer: Self-pay

## 2021-01-30 ENCOUNTER — Telehealth: Payer: Self-pay | Admitting: Internal Medicine

## 2021-01-30 NOTE — Telephone Encounter (Signed)
Copied from [place] [id number]. Topic: General - Other >> Jan 30, 2021 12:51 PM Tessa Lerner A wrote: Reason for CRM: Patient would like to be prescribed something stronger than clotrimazole (LOTRIMIN) 1 % cream for their fungal issues   The patient shares that they've taken the medication in both spray and cream form previously and does not find it to be effective   The patient would like to be contacted to discuss an alternative prescription when possible  Please contact to further advise

## 2021-01-30 NOTE — Telephone Encounter (Signed)
Pt has upcoming appointment I believe newlin sent the cream for her feet, but she can follow up on this issue at her upcoming appointment.

## 2021-02-06 ENCOUNTER — Other Ambulatory Visit: Payer: Self-pay | Admitting: Internal Medicine

## 2021-02-06 ENCOUNTER — Other Ambulatory Visit: Payer: Self-pay

## 2021-02-06 NOTE — Telephone Encounter (Signed)
Requested medication (s) are due for refill today: no  Requested medication (s) are on the active medication list: yes   Last refill:  12/19/2020  Future visit scheduled: yes   Notes to clinic: Failed protocol: Mammogram is up-to-date per Health Maintenance   Requested Prescriptions  Pending Prescriptions Disp Refills   estradiol (ESTRACE) 2 MG tablet 30 tablet 2    Sig: TAKE 1 TABLET (2 MG TOTAL) BY MOUTH DAILY.      OB/GYN:  Estrogens Failed - 02/06/2021  1:42 PM      Failed - Mammogram is up-to-date per Health Maintenance      Failed - Last BP in normal range    BP Readings from Last 1 Encounters:  06/15/20 (!) 116/92          Passed - Valid encounter within last 12 months    Recent Outpatient Visits           1 week ago Lipoma of other specified sites   [place], Charlane Ferretti, MD   5 months ago Insomnia, unspecified type   [place], [place], [place]   9 months ago Essential hypertension   [place], Jarome Matin, RPH-CPP   9 months ago Essential hypertension   [place], MD   1 year ago Urinary frequency   [place], MD       Future Appointments             In 1 week Ladell Pier, MD [place]

## 2021-02-07 ENCOUNTER — Other Ambulatory Visit: Payer: Self-pay

## 2021-02-09 MED ORDER — ESTRADIOL 2 MG PO TABS
2.0000 mg | ORAL_TABLET | Freq: Every day | ORAL | 1 refills | Status: DC
  Filled 2021-02-09 – 2021-03-14 (×2): qty 30, 30d supply, fill #0
  Filled 2021-04-19: qty 30, 30d supply, fill #1

## 2021-02-10 ENCOUNTER — Other Ambulatory Visit: Payer: Self-pay

## 2021-02-13 NOTE — Telephone Encounter (Signed)
Pt has an appt on 6/28

## 2021-02-14 ENCOUNTER — Ambulatory Visit: Payer: Self-pay | Admitting: Internal Medicine

## 2021-02-15 NOTE — Telephone Encounter (Signed)
Pt no showed for appt. Pt will need to be seen for future refills. Last refill given by provider on 6/23

## 2021-02-17 ENCOUNTER — Other Ambulatory Visit: Payer: Self-pay

## 2021-02-22 ENCOUNTER — Ambulatory Visit: Payer: Self-pay | Admitting: Family Medicine

## 2021-03-03 ENCOUNTER — Other Ambulatory Visit: Payer: Self-pay

## 2021-03-14 ENCOUNTER — Other Ambulatory Visit: Payer: Self-pay | Admitting: Family Medicine

## 2021-03-14 ENCOUNTER — Other Ambulatory Visit: Payer: Self-pay

## 2021-03-14 DIAGNOSIS — I10 Essential (primary) hypertension: Secondary | ICD-10-CM

## 2021-03-14 NOTE — Telephone Encounter (Signed)
Notes to clinic:  Patient no showed appt on 02/14/2021 Review for refill No new appt has been made    Requested Prescriptions  Pending Prescriptions Disp Refills   hydrochlorothiazide (HYDRODIURIL) 25 MG tablet 30 tablet 1    Sig: TAKE 1 TABLET BY MOUTH DAILY. MUST HAVE OFFICE VISIT FOR REFILLS      Cardiovascular: Diuretics - Thiazide Failed - 03/14/2021 11:13 AM      Failed - Last BP in normal range    BP Readings from Last 1 Encounters:  06/15/20 (!) 116/92          Passed - Ca in normal range and within 360 days    Calcium  Date Value Ref Range Status  01/25/2021 9.7 8.7 - 10.2 mg/dL Final          Passed - Cr in normal range and within 360 days    Creat  Date Value Ref Range Status  05/24/2017 0.82 0.50 - 1.10 mg/dL Final   Creatinine, Ser  Date Value Ref Range Status  01/25/2021 0.68 0.57 - 1.00 mg/dL Final          Passed - K in normal range and within 360 days    Potassium  Date Value Ref Range Status  01/25/2021 4.5 3.5 - 5.2 mmol/L Final          Passed - Na in normal range and within 360 days    Sodium  Date Value Ref Range Status  01/25/2021 139 134 - 144 mmol/L Final          Passed - Valid encounter within last 6 months    Recent Outpatient Visits           1 month ago Lipoma of other specified sites   [place], Charlane Ferretti, MD   6 months ago Insomnia, unspecified type   [place], [place], [place]   10 months ago Essential hypertension   [place], RPH-CPP   10 months ago Essential hypertension   [place] Ladell Pier, MD   1 year ago Urinary frequency   [place], MD                  amLODipine (NORVASC) 10 MG tablet 30 tablet 1    Sig: TAKE 1 TABLET (10 MG TOTAL) BY MOUTH DAILY. MUST HAVE OFFICE VISIT FOR REFILLS       Cardiovascular:  Calcium Channel Blockers Failed - 03/14/2021 11:13 AM      Failed - Last BP in normal range    BP Readings from Last 1 Encounters:  06/15/20 (!) 116/92          Passed - Valid encounter within last 6 months    Recent Outpatient Visits           1 month ago Lipoma of other specified sites   [place], Charlane Ferretti, MD   6 months ago Insomnia, unspecified type   [place], [place], [place]   10 months ago Essential hypertension   [place], RPH-CPP   10 months ago Essential hypertension   [place] Ladell Pier, MD   1 year ago Urinary frequency   [facility] Antony Blackbird, MD

## 2021-03-15 ENCOUNTER — Other Ambulatory Visit: Payer: Self-pay

## 2021-03-15 MED ORDER — AMLODIPINE BESYLATE 10 MG PO TABS
ORAL_TABLET | Freq: Every day | ORAL | 1 refills | Status: DC
  Filled 2021-03-15: qty 30, 30d supply, fill #0
  Filled 2021-04-19: qty 30, 30d supply, fill #1

## 2021-03-15 MED ORDER — HYDROCHLOROTHIAZIDE 25 MG PO TABS
ORAL_TABLET | ORAL | 1 refills | Status: DC
  Filled 2021-03-15: qty 30, 30d supply, fill #0
  Filled 2021-04-19: qty 30, 30d supply, fill #1

## 2021-03-17 ENCOUNTER — Other Ambulatory Visit: Payer: Self-pay

## 2021-04-05 ENCOUNTER — Other Ambulatory Visit: Payer: Self-pay

## 2021-04-05 ENCOUNTER — Other Ambulatory Visit: Payer: Self-pay | Admitting: Family Medicine

## 2021-04-05 DIAGNOSIS — I10 Essential (primary) hypertension: Secondary | ICD-10-CM

## 2021-04-05 MED ORDER — METOPROLOL SUCCINATE ER 25 MG PO TB24
25.0000 mg | ORAL_TABLET | Freq: Every day | ORAL | 1 refills | Status: DC
  Filled 2021-04-05 – 2021-04-19 (×2): qty 30, 30d supply, fill #0
  Filled 2021-06-01: qty 30, 30d supply, fill #1

## 2021-04-12 ENCOUNTER — Other Ambulatory Visit: Payer: Self-pay

## 2021-04-19 ENCOUNTER — Other Ambulatory Visit: Payer: Self-pay

## 2021-04-19 ENCOUNTER — Other Ambulatory Visit: Payer: Self-pay | Admitting: Family Medicine

## 2021-04-19 DIAGNOSIS — G4700 Insomnia, unspecified: Secondary | ICD-10-CM

## 2021-04-19 MED ORDER — MIRTAZAPINE 15 MG PO TABS
15.0000 mg | ORAL_TABLET | Freq: Every day | ORAL | 0 refills | Status: DC
  Filled 2021-04-19: qty 30, 30d supply, fill #0

## 2021-04-19 NOTE — Telephone Encounter (Signed)
Requested medication (s) are due for refill today - yes  Requested medication (s) are on the active medication list -yes  Future visit scheduled -yes  Last refill: 03/03/21  Notes to clinic: Patient falls lab protocol- last WBC count 12/22/19, provider is aware of triglyceride level per lab notes  Requested Prescriptions  Pending Prescriptions Disp Refills   mirtazapine (REMERON) 15 MG tablet 30 tablet 1    Sig: TAKE 1 TABLET (15 MG TOTAL) BY MOUTH AT BEDTIME.     Psychiatry: Antidepressants - mirtazapine Failed - 04/19/2021 11:13 AM      Failed - Triglycerides in normal range and within 360 days    Triglycerides  Date Value Ref Range Status  01/25/2021 301 (H) 0 - 149 mg/dL Final          Failed - WBC in normal range and within 360 days    WBC  Date Value Ref Range Status  12/22/2019 4.5 4.0 - 10.5 K/uL Final          Passed - AST in normal range and within 360 days    AST  Date Value Ref Range Status  01/25/2021 39 0 - 40 IU/L Final          Passed - ALT in normal range and within 360 days    ALT  Date Value Ref Range Status  01/25/2021 22 0 - 32 IU/L Final          Passed - Total Cholesterol in normal range and within 360 days    Cholesterol, Total  Date Value Ref Range Status  01/25/2021 154 100 - 199 mg/dL Final          Passed - Valid encounter within last 6 months    Recent Outpatient Visits           2 months ago Lipoma of other specified sites   [place], Charlane Ferretti, MD   7 months ago Insomnia, unspecified type   [place], Lev[place] M, [place]   11 months ago Essential hypertension   [place], Jarome Matin, RPH-CPP   12 months ago Essential hypertension   [place] Ladell Pier, MD   1 year ago Urinary frequency   [place], MD                  Requested Prescriptions  Pending Prescriptions Disp Refills   mirtazapine (REMERON) 15 MG tablet 30 tablet 1    Sig: TAKE 1 TABLET (15 MG TOTAL) BY MOUTH AT BEDTIME.     Psychiatry: Antidepressants - mirtazapine Failed - 04/19/2021 11:13 AM      Failed - Triglycerides in normal range and within 360 days    Triglycerides  Date Value Ref Range Status  01/25/2021 301 (H) 0 - 149 mg/dL Final          Failed - WBC in normal range and within 360 days    WBC  Date Value Ref Range Status  12/22/2019 4.5 4.0 - 10.5 K/uL Final          Passed - AST in normal range and within 360 days    AST  Date Value Ref Range Status  01/25/2021 39 0 - 40 IU/L Final          Passed - ALT in normal range and within 360 days  ALT  Date Value Ref Range Status  01/25/2021 22 0 - 32 IU/L Final          Passed - Total Cholesterol in normal range and within 360 days    Cholesterol, Total  Date Value Ref Range Status  01/25/2021 154 100 - 199 mg/dL Final          Passed - Valid encounter within last 6 months    Recent Outpatient Visits           2 months ago Lipoma of other specified sites   [place], [place], MD   7 months ago Insomnia, unspecified type   [place], Levada Dy M, [place]   11 months ago Essential hypertension   [place], RPH-CPP   12 months ago Essential hypertension   [facility] Ladell Pier, MD   1 year ago Urinary frequency   [place] Antony Blackbird, MD

## 2021-04-20 ENCOUNTER — Other Ambulatory Visit: Payer: Self-pay

## 2021-04-27 ENCOUNTER — Other Ambulatory Visit: Payer: Self-pay

## 2021-06-01 ENCOUNTER — Other Ambulatory Visit: Payer: Self-pay | Admitting: Internal Medicine

## 2021-06-01 ENCOUNTER — Other Ambulatory Visit: Payer: Self-pay

## 2021-06-01 DIAGNOSIS — I10 Essential (primary) hypertension: Secondary | ICD-10-CM

## 2021-06-01 DIAGNOSIS — G4700 Insomnia, unspecified: Secondary | ICD-10-CM

## 2021-06-02 ENCOUNTER — Other Ambulatory Visit: Payer: Self-pay

## 2021-06-02 NOTE — Telephone Encounter (Signed)
Requested Prescriptions  Pending Prescriptions Disp Refills  . hydrochlorothiazide (HYDRODIURIL) 25 MG tablet 30 tablet 1    Sig: TAKE 1 TABLET BY MOUTH DAILY. MUST HAVE OFFICE VISIT FOR REFILLS     Cardiovascular: Diuretics - Thiazide Failed - 06/01/2021 11:25 AM      Failed - Last BP in normal range    BP Readings from Last 1 Encounters:  06/15/20 (!) 116/92         Passed - Ca in normal range and within 360 days    Calcium  Date Value Ref Range Status  01/25/2021 9.7 8.7 - 10.2 mg/dL Final         Passed - Cr in normal range and within 360 days    Creat  Date Value Ref Range Status  05/24/2017 0.82 0.50 - 1.10 mg/dL Final   Creatinine, Ser  Date Value Ref Range Status  01/25/2021 0.68 0.57 - 1.00 mg/dL Final         Passed - K in normal range and within 360 days    Potassium  Date Value Ref Range Status  01/25/2021 4.5 3.5 - 5.2 mmol/L Final         Passed - Na in normal range and within 360 days    Sodium  Date Value Ref Range Status  01/25/2021 139 134 - 144 mmol/L Final         Passed - Valid encounter within last 6 months    Recent Outpatient Visits          4 months ago Lipoma of other specified sites   [place], Charlane Ferretti, MD   9 months ago Insomnia, unspecified type   [place], Dionne [place], [place]   1 year ago Essential hypertension   [place], Jarome Matin, RPH-CPP   1 year ago Essential hypertension   [place] Ladell Pier, MD   1 year ago Urinary frequency   [place], MD             . estradiol (ESTRACE) 2 MG tablet 30 tablet 1    Sig: Take 1 tablet (2 mg total) by mouth daily. Needs to be seen prior to next refill request.     OB/GYN:  Estrogens Failed - 06/01/2021 11:25 AM      Failed - Mammogram is up-to-date per Health Maintenance       Failed - Last BP in normal range    BP Readings from Last 1 Encounters:  06/15/20 (!) 116/92         Passed - Valid encounter within last 12 months    Recent Outpatient Visits          4 months ago Lipoma of other specified sites   [place], Charlane Ferretti, MD   9 months ago Insomnia, unspecified type   [place], Dionne Bu[place], [place]   1 year ago Essential hypertension   [place], RPH-CPP   1 year ago Essential hypertension   [place] Ladell Pier, MD   1 year ago Urinary frequency   [place] Fulp, [place], MD             . mirtazapine (REMERON) 15 MG tablet 30  tablet 0    Sig: Take 1 tablet (15 mg total) by mouth at bedtime.     Psychiatry: Antidepressants - mirtazapine Failed - 06/01/2021 11:25 AM      Failed - Triglycerides in normal range and within 360 days    Triglycerides  Date Value Ref Range Status  01/25/2021 301 (H) 0 - 149 mg/dL Final         Failed - WBC in normal range and within 360 days    WBC  Date Value Ref Range Status  12/22/2019 4.5 4.0 - 10.5 K/uL Final         Passed - AST in normal range and within 360 days    AST  Date Value Ref Range Status  01/25/2021 39 0 - 40 IU/L Final         Passed - ALT in normal range and within 360 days    ALT  Date Value Ref Range Status  01/25/2021 22 0 - 32 IU/L Final         Passed - Total Cholesterol in normal range and within 360 days    Cholesterol, Total  Date Value Ref Range Status  01/25/2021 154 100 - 199 mg/dL Final         Passed - Valid encounter within last 6 months    Recent Outpatient Visits          4 months ago Lipoma of other specified sites   [place], Charlane Ferretti, MD   9 months ago Insomnia, unspecified type   [place], Dionne [place], [place]   1 year ago Essential hypertension   [place], Jarome Matin, RPH-CPP   1 year ago Essential hypertension   [place] Ladell Pier, MD   1 year ago Urinary frequency   [place] Fulp, [place], MD             . amLODipine (NORVASC) 10 MG tablet 30 tablet 1    Sig: TAKE 1 TABLET (10 MG TOTAL) BY MOUTH DAILY. MUST HAVE OFFICE VISIT FOR REFILLS     Cardiovascular:  Calcium Channel Blockers Failed - 06/01/2021 11:25 AM      Failed - Last BP in normal range    BP Readings from Last 1 Encounters:  06/15/20 (!) 116/92         Passed - Valid encounter within last 6 months    Recent Outpatient Visits          4 months ago Lipoma of other specified sites   [place], Charlane Ferretti, MD   9 months ago Insomnia, unspecified type   [place], [place], [place]   1 year ago Essential hypertension   [place], RPH-CPP   1 year ago Essential hypertension   [place] Ladell Pier, MD   1 year ago Urinary frequency   [place], [place], MD             Has already had a curtesy refill and there is no upcoming appointment scheduled. NO SHOW and canceled.

## 2021-06-05 ENCOUNTER — Other Ambulatory Visit: Payer: Self-pay

## 2021-06-13 ENCOUNTER — Encounter: Payer: Self-pay | Admitting: Physician Assistant

## 2021-06-13 ENCOUNTER — Ambulatory Visit: Payer: Self-pay | Attending: Physician Assistant | Admitting: Physician Assistant

## 2021-06-13 ENCOUNTER — Other Ambulatory Visit: Payer: Self-pay

## 2021-06-13 VITALS — BP 129/87 | HR 74 | Temp 98.8°F | Resp 18 | Ht 65.5 in | Wt 166.0 lb

## 2021-06-13 DIAGNOSIS — G4700 Insomnia, unspecified: Secondary | ICD-10-CM

## 2021-06-13 DIAGNOSIS — Z6827 Body mass index (BMI) 27.0-27.9, adult: Secondary | ICD-10-CM

## 2021-06-13 DIAGNOSIS — E781 Pure hyperglyceridemia: Secondary | ICD-10-CM

## 2021-06-13 DIAGNOSIS — D1779 Benign lipomatous neoplasm of other sites: Secondary | ICD-10-CM

## 2021-06-13 DIAGNOSIS — B351 Tinea unguium: Secondary | ICD-10-CM

## 2021-06-13 DIAGNOSIS — B353 Tinea pedis: Secondary | ICD-10-CM

## 2021-06-13 DIAGNOSIS — R222 Localized swelling, mass and lump, trunk: Secondary | ICD-10-CM

## 2021-06-13 DIAGNOSIS — I10 Essential (primary) hypertension: Secondary | ICD-10-CM

## 2021-06-13 DIAGNOSIS — K219 Gastro-esophageal reflux disease without esophagitis: Secondary | ICD-10-CM

## 2021-06-13 MED ORDER — AMLODIPINE BESYLATE 10 MG PO TABS
10.0000 mg | ORAL_TABLET | Freq: Every day | ORAL | 2 refills | Status: DC
  Filled 2021-06-13: qty 30, 30d supply, fill #0
  Filled 2021-07-10: qty 30, 30d supply, fill #1

## 2021-06-13 MED ORDER — TERBINAFINE HCL 250 MG PO TABS
250.0000 mg | ORAL_TABLET | Freq: Every day | ORAL | 2 refills | Status: DC
  Filled 2021-06-13: qty 30, 30d supply, fill #0
  Filled 2021-07-10: qty 30, 30d supply, fill #1
  Filled 2021-08-30: qty 30, 30d supply, fill #0

## 2021-06-13 MED ORDER — MIRTAZAPINE 30 MG PO TABS
30.0000 mg | ORAL_TABLET | Freq: Every day | ORAL | 2 refills | Status: DC
  Filled 2021-06-13: qty 30, 30d supply, fill #0
  Filled 2021-07-10: qty 30, 30d supply, fill #1

## 2021-06-13 MED ORDER — HYDROCHLOROTHIAZIDE 25 MG PO TABS
25.0000 mg | ORAL_TABLET | Freq: Every day | ORAL | 2 refills | Status: DC
  Filled 2021-06-13: qty 30, 30d supply, fill #0
  Filled 2021-07-10: qty 30, 30d supply, fill #1

## 2021-06-13 MED ORDER — METOPROLOL SUCCINATE ER 25 MG PO TB24
25.0000 mg | ORAL_TABLET | Freq: Every day | ORAL | 2 refills | Status: DC
  Filled 2021-06-13 – 2021-07-10 (×2): qty 30, 30d supply, fill #0

## 2021-06-13 NOTE — Progress Notes (Signed)
Established Patient Office Visit  Subjective:  Patient ID: Becky Koch, female    DOB: [birth year]  Age: 50 y.o. MRN: [id number]  CC:  Chief Complaint  Patient presents with   Medication Refill    HPI Wagner that she has been taking remeron for the past year, states that it is not offering relief, still having a hard time falling asleep and staying asleep.  Reports she is only sleeping 3 to 4 hours.  Has previously failed trazodone.  States that she checks her BP at home, and describes her blood pressure as "perfect".  Similar to today.  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  Reports this has previously been evaluated.     IMPRESSION: 1. No acute findings in the abdomen or pelvis. 2. There is no discrete mass of the anterior left upper abdominal wall. 10 mm focus of hazy soft tissue attenuation is noted in the anterior left upper abdominal wall, but is nonspecific and is not a discrete mass. This could potentially reflect an area of previous infection or inflammation. Patient is also noted to have asymmetry of the anterior left lower ribs creating a slight asymmetric anterior bulge on the left compared to the right.     Electronically Signed   By: Misty Stanley M.D.   On: 05/02/2019 18:39  Reports that she has noticed a second bump in her left upper quadrant, states that both bump do feel tender on occasion.  States that she has been taking fish oil approximately every other day to help with her elevated cholesterol levels.  Does endorse drinking approximately 3 beers a week states that she currently vapes tobacco, that she is no longer a cigarette smoker   Reports that she has dark thickened toenails on her right foot.  Reports that she has tried over-the-counter and prescription creams without any relief.  States that she finds this very embarrassing and tends to hide her feet.   Past Medical History:  Diagnosis  Date   Allergy    Anemia    Blood transfusion without reported diagnosis    Fibroid, uterine    GERD (gastroesophageal reflux disease)    Hypertension     Past Surgical History:  Procedure Laterality Date   LAPAROSCOPIC LYSIS OF ADHESIONS N/A 08/03/2019   Procedure: Laparoscopic Lysis Of Adhesions;  Surgeon: [name], MD;  Location: [place];  Service: General;  Laterality: N/A;   LAPAROSCOPY N/A 08/03/2019   Procedure: LAPAROSCOPY DIAGNOSTIC;  Surgeon: [name], MD;  Location: [place];  Service: General;  Laterality: N/A;   TUBAL LIGATION     VAGINAL HYSTERECTOMY Bilateral 07/28/2019   Procedure: HYSTERECTOMY VAGINAL WITH BILATERAL SALPINGECTOMY;  Surgeon: Emily Filbert, MD;  Location: [place];  Service: Gynecology;  Laterality: Bilateral;    Family History  Problem Relation Age of Onset   Cancer Paternal Grandfather    Colon cancer Neg Hx    Colon polyps Neg Hx    Esophageal cancer Neg Hx    Rectal cancer Neg Hx    Stomach cancer Neg Hx     Social History   Socioeconomic History   Marital status: Legally Separated    Spouse name: Not on file   Number of children: Not on file   Years of education: Not on file   Highest education level: Not on file  Occupational History   Not on file  Tobacco Use  Smoking status: Every Day    Types: E-cigarettes   Smokeless tobacco: Never  Vaping Use   Vaping Use: Never used  Substance and Sexual Activity   Alcohol use: Yes    Comment: social alcohol   Drug use: No   Sexual activity: Yes    Birth control/protection: Surgical  Other Topics Concern   Not on file  Social History Narrative   Not on file   Social Determinants of Health   Financial Resource Strain: Not on file  Food Insecurity: Not on file  Transportation Needs: Not on file  Physical Activity: Not on file  Stress: Not on file  Social Connections: Not on file  Intimate Partner Violence: Not on file    Outpatient Medications  Prior to Visit  Medication Sig Dispense Refill   [facility] POTASSIUM GLUCONATE PO Take 1 tablet by mouth every other day.     estradiol (ESTRACE) 2 MG tablet Take 1 tablet (2 mg total) by mouth daily. Needs to be seen prior to next refill request. 30 tablet 1   omeprazole (PRILOSEC) 20 MG capsule Take 1 capsule (20 mg total) by mouth daily. 30 capsule 3   amLODipine (NORVASC) 10 MG tablet TAKE 1 TABLET (10 MG TOTAL) BY MOUTH DAILY. MUST HAVE OFFICE VISIT FOR REFILLS 30 tablet 1   hydrochlorothiazide (HYDRODIURIL) 25 MG tablet TAKE 1 TABLET BY MOUTH DAILY. MUST HAVE OFFICE VISIT FOR REFILLS 30 tablet 1   metoprolol succinate (TOPROL-XL) 25 MG 24 hr tablet Take 1 tablet (25 mg total) by mouth daily. 30 tablet 1   mirtazapine (REMERON) 15 MG tablet Take 1 tablet (15 mg total) by mouth at bedtime. 30 tablet 0   clotrimazole (LOTRIMIN) 1 % cream Apply 1 application topically 2 (two) times daily. (Patient not taking: Reported on 06/13/2021) 30 g 0   No facility-administered medications prior to visit.    No Known Allergies  ROS Review of Systems  Constitutional: Negative.   HENT: Negative.    Eyes: Negative.   Respiratory:  Negative for shortness of breath.   Cardiovascular:  Negative for chest pain.  Gastrointestinal:  Negative for abdominal pain, diarrhea, nausea and vomiting.  Endocrine: Negative.   Genitourinary: Negative.   Musculoskeletal: Negative.   Skin: Negative.   Allergic/Immunologic: Negative.   Neurological: Negative.   Hematological: Negative.   Psychiatric/Behavioral:  Positive for sleep disturbance. Negative for dysphoric mood, self-injury and suicidal ideas. The patient is not nervous/anxious.      Objective:    Physical Exam Vitals and nursing note reviewed.  Constitutional:      Appearance: Normal appearance.  HENT:     Head: Normocephalic and atraumatic.     Right Ear: External ear normal.     Left Ear: External ear normal.     Nose: Nose normal.      Mouth/Throat:     Mouth: Mucous membranes are moist.     Pharynx: Oropharynx is clear.  Eyes:     Extraocular Movements: Extraocular movements intact.     Conjunctiva/sclera: Conjunctivae normal.     Pupils: Pupils are equal, round, and reactive to light.  Cardiovascular:     Rate and Rhythm: Normal rate and regular rhythm.     Pulses: Normal pulses.     Heart sounds: Normal heart sounds.  Pulmonary:     Effort: Pulmonary effort is normal.     Breath sounds: Normal breath sounds.  Abdominal:     General: Abdomen is flat.     Palpations:  There is mass.     Tenderness: There is no abdominal tenderness.     Comments: Two grape size subcutaneous mobile masses noted left upper quadrant  Musculoskeletal:        General: Normal range of motion.     Cervical back: Normal range of motion and neck supple.  Feet:     Right foot:     Toenail Condition: Right toenails are abnormally thick. Fungal disease present.    Left foot:     Toenail Condition: Left toenails are normal.  Skin:    General: Skin is warm and dry.  Neurological:     General: No focal deficit present.     Mental Status: She is alert and oriented to person, place, and time.  Psychiatric:        Mood and Affect: Mood normal.        Behavior: Behavior normal.        Thought Content: Thought content normal.        Judgment: Judgment normal.    BP 129/87 (BP Location: Left Arm, Patient Position: Sitting, Cuff Size: Normal)   Pulse 74   Temp 98.8 F (37.1 C) (Oral)   Resp 18   Ht 5' 5.5" (1.664 m)   Wt 166 lb (75.3 kg)   LMP 07/24/2019   SpO2 98%   BMI 27.20 kg/m  Wt Readings from Last 3 Encounters:  06/13/21 166 lb (75.3 kg)  06/15/20 150 lb (68 kg)  04/21/20 150 lb (68 kg)     Health Maintenance Due  Topic Date Due   COVID-19 Vaccine (1) Never done   Pneumococcal Vaccine 61-8 Years old (1 - PCV) Never done   Hepatitis C Screening  Never done   Zoster Vaccines- Shingrix (1 of 2) Never done   MAMMOGRAM   02/03/2021   PAP SMEAR-Modifier  07/31/2021    There are no preventive care reminders to display for this patient.  Lab Results  Component Value Date   TSH 2.060 04/21/2020   Lab Results  Component Value Date   WBC 4.5 12/22/2019   HGB 14.0 12/22/2019   HCT 43.1 12/22/2019   MCV 84.8 12/22/2019   PLT 382 12/22/2019   Lab Results  Component Value Date   NA 139 01/25/2021   K 4.5 01/25/2021   CO2 21 01/25/2021   GLUCOSE 90 01/25/2021   BUN 8 01/25/2021   CREATININE 0.68 01/25/2021   BILITOT <0.2 01/25/2021   ALKPHOS 75 01/25/2021   AST 39 01/25/2021   ALT 22 01/25/2021   PROT 7.6 01/25/2021   ALBUMIN 4.4 01/25/2021   CALCIUM 9.7 01/25/2021   ANIONGAP 13 12/22/2019   EGFR 105 01/25/2021   Lab Results  Component Value Date   CHOL 154 01/25/2021   Lab Results  Component Value Date   HDL 38 (L) 01/25/2021   Lab Results  Component Value Date   LDLCALC 68 01/25/2021   Lab Results  Component Value Date   TRIG 301 (H) 01/25/2021   Lab Results  Component Value Date   CHOLHDL 4.1 01/25/2021   Lab Results  Component Value Date   HGBA1C 5.2 04/21/2020      Assessment & Plan:   Problem List Items Addressed This Visit   None Visit Diagnoses     Essential hypertension    -  Primary   Relevant Medications   amLODipine (NORVASC) 10 MG tablet   hydrochlorothiazide (HYDRODIURIL) 25 MG tablet   metoprolol succinate (TOPROL-XL) 25 MG 24  hr tablet   Other Relevant Orders   Hepatic Function Panel   Gastroesophageal reflux disease without esophagitis       Insomnia, unspecified type       Relevant Medications   mirtazapine (REMERON) 30 MG tablet   Onychomycosis       Relevant Medications   terbinafine (LAMISIL) 250 MG tablet   Lipoma of other specified sites       Subcutaneous mass of abdominal wall       Relevant Orders   US Abdomen Complete   Tinea pedis of right foot       Relevant Medications   terbinafine (LAMISIL) 250 MG tablet    Hypertriglyceridemia       Relevant Medications   amLODipine (NORVASC) 10 MG tablet   hydrochlorothiazide (HYDRODIURIL) 25 MG tablet   metoprolol succinate (TOPROL-XL) 25 MG 24 hr tablet   Other Relevant Orders   Lipid panel       Meds ordered this encounter  Medications   amLODipine (NORVASC) 10 MG tablet    Sig: Take 1 tablet (10 mg total) by mouth daily.    Dispense:  30 tablet    Refill:  2    Order Specific Question:   Supervising Provider    Answer:   Elsie Stain [[id number]]   hydrochlorothiazide (HYDRODIURIL) 25 MG tablet    Sig: Take 1 tablet (25 mg total) by mouth daily.    Dispense:  30 tablet    Refill:  2    Order Specific Question:   Supervising Provider    Answer:   Elsie Stain [[id number]]   metoprolol succinate (TOPROL-XL) 25 MG 24 hr tablet    Sig: Take 1 tablet (25 mg total) by mouth daily.    Dispense:  30 tablet    Refill:  2    Order Specific Question:   Supervising Provider    Answer:   Asencion Noble E [[id number]]   mirtazapine (REMERON) 30 MG tablet    Sig: Take 1 tablet (30 mg total) by mouth at bedtime.    Dispense:  30 tablet    Refill:  2    Dose change    Order Specific Question:   Supervising Provider    Answer:   Elsie Stain [[id number]]   terbinafine (LAMISIL) 250 MG tablet    Sig: Take 1 tablet (250 mg total) by mouth daily.    Dispense:  30 tablet    Refill:  2    Order Specific Question:   Supervising Provider    Answer:   WRIGHT, PATRICK E [[id number]]   1. Essential hypertension Continue current regimen.  Patient encouraged to check blood pressure at home, keep a written log and have available for all office visits. - amLODipine (NORVASC) 10 MG tablet; Take 1 tablet (10 mg total) by mouth daily.  Dispense: 30 tablet; Refill: 2 - hydrochlorothiazide (HYDRODIURIL) 25 MG tablet; Take 1 tablet (25 mg total) by mouth daily.  Dispense: 30 tablet; Refill: 2 - metoprolol succinate (TOPROL-XL) 25 MG 24 hr tablet; Take 1 tablet (25 mg total) by mouth  daily.  Dispense: 30 tablet; Refill: 2 - Hepatic Function Panel; Future  2. Hypertriglyceridemia Patient encouraged daily compliance to fish oil, patient education given on low-cholesterol diet.  Patient to return to clinic for fasting lipid panel in 1 month. - Lipid panel; Future  3. Insomnia, unspecified type Increase Remeron 30 mg, patient education given on good sleep hygiene - mirtazapine (REMERON) 30 MG tablet;  Take 1 tablet (30 mg total) by mouth at bedtime.  Dispense: 30 tablet; Refill: 2  4. Subcutaneous mass of abdominal wall  - US Abdomen Complete; Future  5. Onychomycosis Trial Lamisil.  Patient to return to clinic in 1 month for review of hepatic function panel.     I have reviewed the patient's medical history (PMH, PSH, Social History, Family History, Medications, and allergies) , and have been updated if relevant. I spent 30 minutes reviewing chart and  face to face time with patient.   Follow-up: Return in about 1 month (around 07/14/2021).    Loraine Grip Mayers, PA-C

## 2021-06-13 NOTE — Progress Notes (Signed)
Patient has eaten and taken medication today. Patient request increase on Remeron and refill in chronic medications.

## 2021-06-13 NOTE — Patient Instructions (Addendum)
To help with your insomnia, you will take an increased dose of Remeron, 30 mg.  To help with your nail fungus, you will start taking Lamisil once daily for 12 weeks.  I encourage you to check your blood pressure at home on a daily basis, keep a written log and have available for all office visits.  I do encourage you to take your fish oil on a daily basis and follow a Mediterranean style eating plan.  Please return to the clinic in 1 month to have your cholesterol rechecked as well as your liver function.  We will call you with the results of your ultrasound when they are available.  Kennieth Rad, PA-C Physician Assistant [facility] [email or web address]  Mediterranean Diet A Mediterranean diet refers to food and lifestyle choices that are based on the traditions of countries located on the [place]. This way of eating has been shown to help prevent certain conditions and improve outcomes for people who have chronic diseases, like kidney disease and heart disease. What are tips for following this plan? Lifestyle Cook and eat meals together with your family, when possible. Drink enough fluid to keep your urine clear or pale yellow. Be physically active every day. This includes: Aerobic exercise like running or swimming. Leisure activities like gardening, walking, or housework. Get 7-8 hours of sleep each night. If recommended by your health care provider, drink red wine in moderation. This means 1 glass a day for nonpregnant women and 2 glasses a day for men. A glass of wine equals 5 oz (150 mL). Reading food labels  Check the serving size of packaged foods. For foods such as rice and pasta, the serving size refers to the amount of cooked product, not dry. Check the total fat in packaged foods. Avoid foods that have saturated fat or trans fats. Check the ingredients list for added sugars, such as corn syrup. Shopping At  the grocery store, buy most of your food from the areas near the walls of the store. This includes: Fresh fruits and vegetables (produce). Grains, beans, nuts, and seeds. Some of these may be available in unpackaged forms or large amounts (in bulk). Fresh seafood. Poultry and eggs. Low-fat dairy products. Buy whole ingredients instead of prepackaged foods. Buy fresh fruits and vegetables in-season from local farmers markets. Buy frozen fruits and vegetables in resealable bags. If you do not have access to quality fresh seafood, buy precooked frozen shrimp or canned fish, such as tuna, salmon, or sardines. Buy small amounts of raw or cooked vegetables, salads, or olives from the deli or salad bar at your store. Stock your pantry so you always have certain foods on hand, such as olive oil, canned tuna, canned tomatoes, rice, pasta, and beans. Cooking Cook foods with extra-virgin olive oil instead of using butter or other vegetable oils. Have meat as a side dish, and have vegetables or grains as your main dish. This means having meat in small portions or adding small amounts of meat to foods like pasta or stew. Use beans or vegetables instead of meat in common dishes like chili or lasagna. Experiment with different cooking methods. Try roasting or broiling vegetables instead of steaming or sauteing them. Add frozen vegetables to soups, stews, pasta, or rice. Add nuts or seeds for added healthy fat at each meal. You can add these to yogurt, salads, or vegetable dishes. Marinate fish or vegetables using olive oil, lemon juice, garlic, and fresh herbs. Meal planning  Plan to  eat 1 vegetarian meal one day each week. Try to work up to 2 vegetarian meals, if possible. Eat seafood 2 or more times a week. Have healthy snacks readily available, such as: Vegetable sticks with hummus. Greek yogurt. Fruit and nut trail mix. Eat balanced meals throughout the week. This includes: Fruit: 2-3 servings a  day Vegetables: 4-5 servings a day Low-fat dairy: 2 servings a day Fish, poultry, or lean meat: 1 serving a day Beans and legumes: 2 or more servings a week Nuts and seeds: 1-2 servings a day Whole grains: 6-8 servings a day Extra-virgin olive oil: 3-4 servings a day Limit red meat and sweets to only a few servings a month What are my food choices? Mediterranean diet Recommended Grains: Whole-grain pasta. Brown rice. Bulgar wheat. Polenta. Couscous. Whole-wheat bread. [place]. Vegetables: Artichokes. Beets. Broccoli. Cabbage. Carrots. Eggplant. Green beans. Chard. Kale. Spinach. Onions. Leeks. Peas. Squash. Tomatoes. Peppers. Radishes. Fruits: Apples. Apricots. Avocado. Berries. Bananas. Cherries. Dates. Figs. Grapes. Lemons. Melon. Oranges. Peaches. Plums. Pomegranate. Meats and other protein foods: Beans. Almonds. Sunflower seeds. Pine nuts. Peanuts. [place]. Salmon. Scallops. Shrimp. [place]. Tilapia. Clams. Oysters. Eggs. Dairy: Low-fat milk. Cheese. Greek yogurt. Beverages: Water. Red wine. Herbal tea. Fats and oils: Extra virgin olive oil. Avocado oil. Grape seed oil. Sweets and desserts: [place] yogurt with honey. Baked apples. Poached pears. Trail mix. Seasoning and other foods: Basil. Cilantro. Coriander. Cumin. Mint. Parsley. Sage. Rosemary. Tarragon. Garlic. Oregano. Thyme. Pepper. Balsalmic vinegar. Tahini. Hummus. Tomato sauce. Olives. Mushrooms. Limit these Grains: Prepackaged pasta or rice dishes. Prepackaged cereal with added sugar. Vegetables: Deep fried potatoes (french fries). Fruits: Fruit canned in syrup. Meats and other protein foods: Beef. Pork. Lamb. Poultry with skin. Hot dogs. Berniece Salines. Dairy: Ice cream. Sour cream. Whole milk. Beverages: Juice. Sugar-sweetened soft drinks. Beer. Liquor and spirits. Fats and oils: Butter. Canola oil. Vegetable oil. Beef fat (tallow). Lard. Sweets and desserts: Cookies. Cakes. Pies. Candy. Seasoning and other foods: Mayonnaise.  Premade sauces and marinades. The items listed may not be a complete list. Talk with your dietitian about what dietary choices are right for you. Summary The Mediterranean diet includes both food and lifestyle choices. Eat a variety of fresh fruits and vegetables, beans, nuts, seeds, and whole grains. Limit the amount of red meat and sweets that you eat. Talk with your health care provider about whether it is safe for you to drink red wine in moderation. This means 1 glass a day for nonpregnant women and 2 glasses a day for men. A glass of wine equals 5 oz (150 mL). This information is not intended to replace advice given to you by your health care provider. Make sure you discuss any questions you have with your health care provider. Document Revised: 04/05/2016 Document Reviewed: 03/29/2016 Elsevier Patient Education  [place].

## 2021-06-14 ENCOUNTER — Other Ambulatory Visit: Payer: Self-pay

## 2021-06-14 ENCOUNTER — Other Ambulatory Visit (HOSPITAL_BASED_OUTPATIENT_CLINIC_OR_DEPARTMENT_OTHER): Payer: Self-pay

## 2021-06-14 DIAGNOSIS — G4700 Insomnia, unspecified: Secondary | ICD-10-CM | POA: Insufficient documentation

## 2021-06-14 DIAGNOSIS — K219 Gastro-esophageal reflux disease without esophagitis: Secondary | ICD-10-CM | POA: Insufficient documentation

## 2021-06-14 DIAGNOSIS — B351 Tinea unguium: Secondary | ICD-10-CM | POA: Insufficient documentation

## 2021-06-14 DIAGNOSIS — R222 Localized swelling, mass and lump, trunk: Secondary | ICD-10-CM | POA: Insufficient documentation

## 2021-06-14 DIAGNOSIS — E781 Pure hyperglyceridemia: Secondary | ICD-10-CM | POA: Insufficient documentation

## 2021-06-22 ENCOUNTER — Other Ambulatory Visit: Payer: Self-pay | Admitting: Internal Medicine

## 2021-06-22 NOTE — Telephone Encounter (Signed)
Requested medication (s) are due for refill today:   Yes  Requested medication (s) are on the active medication list:   Yes  Future visit scheduled:   Yes 07/17/2021 with Dr. Wynetta Emery   Last ordered: 02/09/2021 #30, 1 refill  Returned because courtesy refill has been given.   Provider to review for refills prior to appt.   Requested Prescriptions  Pending Prescriptions Disp Refills   estradiol (ESTRACE) 2 MG tablet 30 tablet 1    Sig: Take 1 tablet (2 mg total) by mouth daily. Needs to be seen prior to next refill request.     OB/GYN:  Estrogens Failed - 06/22/2021 11:34 AM      Failed - Mammogram is up-to-date per Health Maintenance      Passed - Last BP in normal range    BP Readings from Last 1 Encounters:  06/13/21 129/87          Passed - Valid encounter within last 12 months    Recent Outpatient Visits           1 week ago Essential hypertension   [place], PA-C   4 months ago Lipoma of other specified sites   [place], Charlane Ferretti, MD   9 months ago Insomnia, unspecified type   [place], Dionne Bucy, [place]   1 year ago Essential hypertension   [place], RPH-CPP   1 year ago Essential hypertension   [place], MD       Future Appointments             In 3 weeks Ladell Pier, MD [place]

## 2021-06-26 ENCOUNTER — Other Ambulatory Visit: Payer: Self-pay

## 2021-07-10 ENCOUNTER — Other Ambulatory Visit: Payer: Self-pay

## 2021-07-10 ENCOUNTER — Other Ambulatory Visit: Payer: Self-pay | Admitting: Internal Medicine

## 2021-07-10 MED ORDER — ESTRADIOL 2 MG PO TABS
2.0000 mg | ORAL_TABLET | Freq: Every day | ORAL | 0 refills | Status: DC
  Filled 2021-07-10: qty 10, 10d supply, fill #0

## 2021-07-10 NOTE — Telephone Encounter (Signed)
Pt had an appt with Cari on 06/13/21

## 2021-07-17 ENCOUNTER — Other Ambulatory Visit: Payer: Self-pay

## 2021-07-17 ENCOUNTER — Ambulatory Visit: Payer: Self-pay | Admitting: Internal Medicine

## 2021-07-19 ENCOUNTER — Encounter: Payer: Self-pay | Admitting: Internal Medicine

## 2021-07-19 ENCOUNTER — Ambulatory Visit: Payer: Self-pay | Attending: Internal Medicine | Admitting: Internal Medicine

## 2021-07-19 ENCOUNTER — Other Ambulatory Visit: Payer: Self-pay

## 2021-07-19 VITALS — BP 140/90 | HR 94 | Resp 16 | Ht 65.5 in | Wt 166.0 lb

## 2021-07-19 DIAGNOSIS — Z23 Encounter for immunization: Secondary | ICD-10-CM

## 2021-07-19 DIAGNOSIS — I10 Essential (primary) hypertension: Secondary | ICD-10-CM

## 2021-07-19 DIAGNOSIS — L729 Follicular cyst of the skin and subcutaneous tissue, unspecified: Secondary | ICD-10-CM | POA: Insufficient documentation

## 2021-07-19 DIAGNOSIS — Z1231 Encounter for screening mammogram for malignant neoplasm of breast: Secondary | ICD-10-CM

## 2021-07-19 DIAGNOSIS — E781 Pure hyperglyceridemia: Secondary | ICD-10-CM

## 2021-07-19 DIAGNOSIS — Z720 Tobacco use: Secondary | ICD-10-CM

## 2021-07-19 DIAGNOSIS — G4700 Insomnia, unspecified: Secondary | ICD-10-CM

## 2021-07-19 MED ORDER — ESTRADIOL 2 MG PO TABS
2.0000 mg | ORAL_TABLET | Freq: Every day | ORAL | 3 refills | Status: DC
Start: 2021-07-19 — End: 2022-06-05
  Filled 2021-07-19 – 2021-08-30 (×2): qty 30, 30d supply, fill #0
  Filled 2021-10-11: qty 30, 30d supply, fill #1
  Filled 2022-02-23: qty 30, 30d supply, fill #2

## 2021-07-19 MED ORDER — AMLODIPINE BESYLATE 10 MG PO TABS
10.0000 mg | ORAL_TABLET | Freq: Every day | ORAL | 6 refills | Status: DC
  Filled 2021-08-30: qty 30, 30d supply, fill #0
  Filled 2021-10-11: qty 30, 30d supply, fill #1
  Filled 2021-11-10: qty 30, 30d supply, fill #2
  Filled 2021-12-12: qty 30, 30d supply, fill #3
  Filled 2022-01-24: qty 30, 30d supply, fill #4
  Filled 2022-02-23: qty 30, 30d supply, fill #5
  Filled 2022-04-09 – 2022-04-18 (×2): qty 30, 30d supply, fill #6

## 2021-07-19 MED ORDER — METOPROLOL SUCCINATE ER 25 MG PO TB24
25.0000 mg | ORAL_TABLET | Freq: Every day | ORAL | 6 refills | Status: DC
  Filled 2021-07-19 – 2021-08-31 (×3): qty 30, 30d supply, fill #0
  Filled 2021-10-11: qty 30, 30d supply, fill #1
  Filled 2021-11-10: qty 30, 30d supply, fill #2
  Filled 2021-12-12: qty 30, 30d supply, fill #3
  Filled 2022-01-24: qty 30, 30d supply, fill #4
  Filled 2022-02-23: qty 30, 30d supply, fill #5

## 2021-07-19 MED ORDER — HYDROCHLOROTHIAZIDE 25 MG PO TABS
25.0000 mg | ORAL_TABLET | Freq: Every day | ORAL | 2 refills | Status: DC
  Filled 2021-08-30: qty 90, 90d supply, fill #0
  Filled 2021-12-12: qty 30, 30d supply, fill #1
  Filled 2022-01-24: qty 30, 30d supply, fill #2
  Filled 2022-02-23: qty 30, 30d supply, fill #3
  Filled 2022-04-09 – 2022-04-18 (×2): qty 30, 30d supply, fill #4
  Filled 2022-06-05 – 2022-06-12 (×2): qty 30, 30d supply, fill #5

## 2021-07-19 MED ORDER — MIRTAZAPINE 30 MG PO TABS
30.0000 mg | ORAL_TABLET | Freq: Every day | ORAL | 5 refills | Status: DC
  Filled 2021-08-30: qty 30, 30d supply, fill #0
  Filled 2021-10-11: qty 30, 30d supply, fill #1
  Filled 2021-11-10: qty 30, 30d supply, fill #2
  Filled 2021-12-18: qty 30, 30d supply, fill #3
  Filled 2022-01-24: qty 30, 30d supply, fill #4
  Filled 2022-02-23: qty 30, 30d supply, fill #5

## 2021-07-19 NOTE — Patient Instructions (Signed)
High Triglycerides Eating Plan °Triglycerides are a type of fat in the blood. High levels of triglycerides can increase your risk of heart disease and stroke. If your triglyceride levels are high, choosing the right foods can help lower your triglycerides and keep your heart healthy. Work with your health care provider or a dietitian to develop an eating plan that is right for you. °What are tips for following this plan? °General guidelines ° °Lose weight, if you are overweight. For most people, losing 5-10 lb (2-5 kg) helps lower triglyceride levels. A weight-loss plan may include: °30 minutes of exercise at least 5 days a week. °Reducing the amount of calories, sugar, and fat you eat. °Eat a wide variety of fresh fruits, vegetables, and whole grains. These foods are high in fiber. °Eat foods that contain healthy fats, such as fatty fish, nuts, seeds, and olive oil. °Avoid foods that are high in added sugar, added salt (sodium), and saturated fat. °Avoid low-fiber, refined carbohydrates such as white bread, crackers, noodles, and white rice. °Avoid foods with trans fats or partially hydrogenated oils, such as fried foods or stick margarine. °If you drink alcohol: °Limit how much you have to: °0-1 drink a day for women who are not pregnant. °0-2 drinks a day for men. °Your health care provider may recommend that you drink less than these amounts depending on your overall health. °Know how much alcohol is in a drink. In the [place] one drink equals one 12 oz bottle of beer (355 mL), one 5 oz glass of wine (148 mL), or one 1½ oz glass of hard liquor (44 mL). °Reading food labels °Check food labels for: °The amount of saturated fat. Choose foods with no or very little saturated fat (less than 2 g). °The amount of trans fat. Choose foods with no transfat. °The amount of cholesterol. Choose foods that are low in cholesterol. °The amount of sodium. Choose foods with less than 140 milligrams (mg) per serving. °Shopping °Buy  dairy products labeled as nonfat (skim) or low-fat (1%). °Avoid buying processed or prepackaged foods. These are often high in added sugar, sodium, and fat. °Cooking °Choose healthy fats when cooking, such as olive oil, avocado oil, or canola oil. °Cook foods using lower fat methods, such as baking, broiling, boiling, or grilling. °Make your own sauces, dressings, and marinades when possible, instead of buying them. Store-bought sauces, dressings, and marinades are often high in sodium and sugar. °Meal planning °Eat more home-cooked food and less restaurant, buffet, and fast food. °Eat fatty fish at least 2 times each week. Examples of fatty fish include salmon, trout, sardines, mackerel, tuna, and herring. °If you eat whole eggs, do not eat more than 4 egg yolks per week. °What foods should I eat? °Fruits °All fresh, canned (in natural juice), or frozen fruits. °Vegetables °Fresh or frozen vegetables. Low-sodium canned vegetables. °Grains °Whole wheat or whole grain breads, crackers, cereals, and pasta. Unsweetened oatmeal. Bulgur. Barley. Quinoa. Brown rice. Whole wheat flour tortillas. °Meats and other proteins °Skinless chicken or turkey. Ground chicken or turkey. Lean cuts of pork, trimmed of fat. Fish and seafood, especially salmon, trout, and herring. Egg whites. Dried beans, peas, or lentils. Unsalted nuts or seeds. Unsalted canned beans. Natural peanut or almond butter or other nut butters. °Dairy °Low-fat dairy products. Skim or low-fat (1%) milk. Reduced fat (2%) and low-sodium cheese. Low-fat ricotta cheese. Low-fat cottage cheese. Plain, low-fat yogurt. °Fats and oils °Tub margarine without trans fats. Light or reduced-fat mayonnaise. Light or   reduced-fat salad dressings. Avocado. Safflower, olive, sunflower, soybean, and canola oils. °The items listed above may not be a complete list of recommended foods and beverages. Talk with your dietitian about what dietary choices are best for you. °What foods  should I avoid? °Fruits °Sweetened dried fruit. Canned fruit in syrup. Fruit juice. °Vegetables °Creamed or fried vegetables. Vegetables in a cheese sauce. °Grains °White bread. White (regular) pasta. White rice. Cornbread. Bagels. Pastries. Crackers that contain trans fat. °Meats and other proteins °Fatty cuts of meat. Ribs. Chicken wings. Bacon. Sausage. Bologna. Salami. Chitterlings. Fatback. Hot dogs. Bratwurst. Packaged lunch meats. °Dairy °Whole or reduced-fat (2%) milk. Half-and-half. Cream cheese. Full-fat or sweetened yogurt. Full-fat cheese. Nondairy creamers. Whipped toppings. Processed cheese or cheese spreads. Cheese curds. °Fats and oils °Butter. Stick margarine. Lard. Shortening. Ghee. Bacon fat. Tropical oils, such as coconut, palm kernel, or palm oils. °Beverages °Alcohol. Sweetened drinks, such as soda, lemonade, fruit drinks, or punches. °Sweets and desserts °Corn syrup. Sugars. Honey. Molasses. Candy. Jam and jelly. Syrup. Sweetened cereals. Cookies. Pies. Cakes. Donuts. Muffins. Ice cream. °Condiments °Store-bought sauces, dressings, and marinades that are high in sugar, such as ketchup and barbecue sauce. °The items listed above may not be a complete list of foods and beverages you should avoid. Talk with your dietitian about what dietary choices are best for you. °Summary °High levels of triglycerides can increase the risk of heart disease and stroke. Choosing the right foods can help lower your triglycerides. °Eat plenty of fresh fruits, vegetables, and whole grains. Choose low-fat dairy and lean meats. Eat fatty fish at least twice a week. °Avoid processed and prepackaged foods with added sugar, sodium, saturated fat, and trans fat. °If you need suggestions or have questions about what types of food are good for you, talk with your health care provider or a dietitian. °This information is not intended to replace advice given to you by your health care provider. Make sure you discuss any  questions you have with your health care provider. °Document Revised: 12/16/2020 Document Reviewed: 12/16/2020 °Elsevier Patient Education © 2022 [employer] Inc[employer] ° °

## 2021-07-19 NOTE — Progress Notes (Signed)
Patient ID: Becky Koch, female    DOB: 1970-09-25  MRN: [id number]  CC: Hypertension   Subjective: Becky Koch is a 50 y.o. female who presents for chronic ds management.  I last saw her 04/2020.  Has seen Dr. Margarita Rana and PA since then. Her concerns today include:  Patient with history of HTN, elev TG, GERD, tob dep, DUB   Elev TG: found to have elev TG on recent blood tests.  Wonders what dietary changes needed. -trying to lose wgh.  Eating healthy snacks. Does stretching exercise 2x/wk  Tob dep: no longer smokes cigarettes.  Started vaping nicotine product.  Wants to get off nicotine altogether. She has tried nicotine patches in past.  Nicotine gum gives her indigestion  HTN:  taking Norvasc, HCTZ and Metoprolol. Out of Norvasc since last wk. Tries to limit salt in foods  Request RF on Estradiol.  Hysterectomy in 2020 for DUB from fibroids.  She thinks everything was taken out including ovaries. Started on Estradiol by GYN.  Due for MMG.  No insurance  On remeron x 1 yr to help with sleep Did not tolerate Trazodone  C/o  2 small lumps in LT upper abdomen.  Largest one present x 2 yrs.  Second one appeared this yr. No increase in size and not painful   Patient Active Problem List   Diagnosis Date Noted   Hypertriglyceridemia 06/14/2021   Insomnia 06/14/2021   Subcutaneous mass of abdominal wall 06/14/2021   Onychomycosis 06/14/2021   Gastroesophageal reflux disease without esophagitis 06/14/2021   Palpitations 06/15/2020   SBO (small bowel obstruction) ([place]) 08/02/2019   DUB (dysfunctional uterine bleeding) 07/28/2019   Post-operative state 07/28/2019   Gastroesophageal reflux disease with esophagitis 12/05/2018   Tobacco use 06/23/2018   Actinic keratoses 05/02/2017   Essential hypertension 07/14/2012     Current Outpatient Medications on File Prior to Visit  Medication Sig Dispense Refill   [facility] POTASSIUM GLUCONATE PO Take 1 tablet by mouth every other day.      omeprazole (PRILOSEC) 20 MG capsule Take 1 capsule (20 mg total) by mouth daily. 30 capsule 3   terbinafine (LAMISIL) 250 MG tablet Take 1 tablet (250 mg total) by mouth daily. 30 tablet 2   [DISCONTINUED] cetirizine (ZYRTEC) 10 MG tablet Take 1 tablet (10 mg total) by mouth daily. (Patient not taking: Reported on 09/15/2019) 30 tablet 11   [DISCONTINUED] traZODone (DESYREL) 50 MG tablet Take 0.5-1 tablets (25-50 mg total) by mouth at bedtime as needed for sleep. (Patient not taking: Reported on 12/22/2019) 30 tablet 3   No current facility-administered medications on file prior to visit.    No Known Allergies  Social History   Socioeconomic History   Marital status: Legally Separated    Spouse name: Not on file   Number of children: Not on file   Years of education: Not on file   Highest education level: Not on file  Occupational History   Not on file  Tobacco Use   Smoking status: Every Day    Types: E-cigarettes   Smokeless tobacco: Never  Vaping Use   Vaping Use: Never used  Substance and Sexual Activity   Alcohol use: Yes    Comment: social alcohol   Drug use: No   Sexual activity: Yes    Birth control/protection: Surgical  Other Topics Concern   Not on file  Social History Narrative   Not on file   Social Determinants of Radio broadcast assistant  Strain: Not on file  Food Insecurity: Not on file  Transportation Needs: Not on file  Physical Activity: Not on file  Stress: Not on file  Social Connections: Not on file  Intimate Partner Violence: Not on file    Family History  Problem Relation Age of Onset   Cancer Paternal Grandfather    Colon cancer Neg Hx    Colon polyps Neg Hx    Esophageal cancer Neg Hx    Rectal cancer Neg Hx    Stomach cancer Neg Hx     Past Surgical History:  Procedure Laterality Date   LAPAROSCOPIC LYSIS OF ADHESIONS N/A 08/03/2019   Procedure: Laparoscopic Lysis Of Adhesions;  Surgeon: [name], MD;  Location: [facility];   Service: General;  Laterality: N/A;   LAPAROSCOPY N/A 08/03/2019   Procedure: LAPAROSCOPY DIAGNOSTIC;  Surgeon: [name], MD;  Location: [facility];  Service: General;  Laterality: N/A;   TUBAL LIGATION     VAGINAL HYSTERECTOMY Bilateral 07/28/2019   Procedure: HYSTERECTOMY VAGINAL WITH BILATERAL SALPINGECTOMY;  Surgeon: Emily Filbert, MD;  Location: [facility];  Service: Gynecology;  Laterality: Bilateral;    ROS: Review of Systems Negative except as stated above  PHYSICAL EXAM: BP 140/90   Pulse 94   Resp 16   Ht 5' 5.5" (1.664 m)   Wt 166 lb (75.3 kg)   LMP 07/24/2019   SpO2 97%   BMI 27.20 kg/m   Wt Readings from Last 3 Encounters:  07/19/21 166 lb (75.3 kg)  06/13/21 166 lb (75.3 kg)  06/15/20 150 lb (68 kg)    Physical Exam  General appearance - alert, well appearing, middle age AAF and in no distress Mental status - normal mood, behavior, speech, dress, motor activity, and thought processes Chest - clear to auscultation, no wheezes, rales or rhonchi, symmetric air entry Heart - normal rate, regular rhythm, normal S1, S2, no murmurs, rubs, clicks or gallops Abdomen - soft, nontender, non-distended.  2 cm firm, moveable, nontender subcut massin LUQ abdomen.  Smaller 1 cm one a few cm below the largest one Extremities - no LE edema   CMP Latest Ref Rng & Units 01/25/2021 06/15/2020 04/21/2020  Glucose 65 - 99 mg/dL 90 94 84  BUN 6 - 24 mg/dL 8 12 10   Creatinine 0.57 - 1.00 mg/dL 0.68 0.73 0.72  Sodium 134 - 144 mmol/L 139 136 137  Potassium 3.5 - 5.2 mmol/L 4.5 4.1 4.5  Chloride 96 - 106 mmol/L 104 98 99  CO2 20 - 29 mmol/L 21 21 24   Calcium 8.7 - 10.2 mg/dL 9.7 10.3(H) 10.1  Total Protein 6.0 - 8.5 g/dL 7.6 - -  Total Bilirubin 0.0 - 1.2 mg/dL <0.2 - -  Alkaline Phos 44 - 121 IU/L 75 - -  AST 0 - 40 IU/L 39 - -  ALT 0 - 32 IU/L 22 - -   Lipid Panel     Component Value Date/Time   CHOL 154 01/25/2021 1018   TRIG 301 (H) 01/25/2021 1018   HDL  38 (L) 01/25/2021 1018   CHOLHDL 4.1 01/25/2021 1018   CHOLHDL 4.3 06/21/2016 0952   VLDL 22 06/21/2016 0952   LDLCALC 68 01/25/2021 1018    CBC    Component Value Date/Time   WBC 4.5 12/22/2019 1231   RBC 5.08 12/22/2019 1231   HGB 14.0 12/22/2019 1231   HGB 14.6 02/18/2019 1559   HCT 43.1 12/22/2019 1231   HCT 44.9 02/18/2019 1559  PLT 382 12/22/2019 1231   PLT 363 02/18/2019 1559   MCV 84.8 12/22/2019 1231   MCV 91 02/18/2019 1559   MCH 27.6 12/22/2019 1231   MCHC 32.5 12/22/2019 1231   RDW 12.6 12/22/2019 1231   RDW 12.5 02/18/2019 1559   LYMPHSABS 1.1 08/02/2019 0626   LYMPHSABS 2.2 02/18/2019 1559   MONOABS 0.7 08/02/2019 0626   EOSABS 0.1 08/02/2019 0626   EOSABS 0.1 02/18/2019 1559   BASOSABS 0.0 08/02/2019 0626   BASOSABS 0.0 02/18/2019 1559    ASSESSMENT AND PLAN: 1. Essential hypertension Not at goal but she has been out of Norvasc.  RF on all 3 meds.  Goal for BP is 130/80 or lower. - amLODipine (NORVASC) 10 MG tablet; Take 1 tablet (10 mg total) by mouth daily.  Dispense: 30 tablet; Refill: 6 - hydrochlorothiazide (HYDRODIURIL) 25 MG tablet; Take 1 tablet (25 mg total) by mouth daily.  Dispense: 90 tablet; Refill: 2 - metoprolol succinate (TOPROL-XL) 25 MG 24 hr tablet; Take 1 tablet (25 mg total) by mouth daily.  Dispense: 30 tablet; Refill: 6  2. Hypertriglyceridemia Dietary counseling given including changes in cooking oils, salad dressings and avoid eating skin chicken/turkey. Printed info given. Discussed importance of regular exercise. Advise 30 mins 5 days a wk of moderate intensity exercise  3. Vapes nicotine containing substance -strongly advise to quit.  Pt plans to wean herself off  4. Subcutaneous cyst Observe for now unless these increase in size or become painful  5. Insomnia, unspecified type Advised that Remeron can increase appetite and cause wgh gain. However she prefers this agent as it has worked well for her - mirtazapine (REMERON)  30 MG tablet; Take 1 tablet (30 mg total) by mouth at bedtime.  Dispense: 30 tablet; Refill: 5  6. Need for immunization against influenza - Flu Vaccine QUAD 82mo+IM (Fluarix, Fluzone & Alfiuria Quad PF)  7. Encounter for screening mammogram for malignant neoplasm of breast Given MMG scholarship form.  Discussed importance of yr MMG while on HRT - MM Digital Screening; Future  8. Postmenopausal RF estradiol. Patient was given the opportunity to ask questions.  Patient verbalized understanding of the plan and was able to repeat key elements of the plan.   Orders Placed This Encounter  Procedures   MM Digital Screening   Flu Vaccine QUAD 47mo+IM (Fluarix, Fluzone & Alfiuria Quad PF)     Requested Prescriptions   Signed Prescriptions Disp Refills   amLODipine (NORVASC) 10 MG tablet 30 tablet 6    Sig: Take 1 tablet (10 mg total) by mouth daily.   estradiol (ESTRACE) 2 MG tablet 30 tablet 3    Sig: Take 1 tablet (2 mg total) by mouth daily. Needs to be seen prior to next refill request.   hydrochlorothiazide (HYDRODIURIL) 25 MG tablet 90 tablet 2    Sig: Take 1 tablet (25 mg total) by mouth daily.   metoprolol succinate (TOPROL-XL) 25 MG 24 hr tablet 30 tablet 6    Sig: Take 1 tablet (25 mg total) by mouth daily.   mirtazapine (REMERON) 30 MG tablet 30 tablet 5    Sig: Take 1 tablet (30 mg total) by mouth at bedtime.    Return in about 4 months (around 11/16/2021).  Karle Plumber, MD, FACP

## 2021-07-20 ENCOUNTER — Other Ambulatory Visit: Payer: Self-pay

## 2021-08-30 ENCOUNTER — Other Ambulatory Visit: Payer: Self-pay

## 2021-08-31 ENCOUNTER — Other Ambulatory Visit: Payer: Self-pay

## 2021-10-11 ENCOUNTER — Other Ambulatory Visit: Payer: Self-pay

## 2021-10-12 ENCOUNTER — Other Ambulatory Visit: Payer: Self-pay

## 2021-11-10 ENCOUNTER — Other Ambulatory Visit: Payer: Self-pay

## 2021-11-14 ENCOUNTER — Other Ambulatory Visit: Payer: Self-pay

## 2021-12-05 ENCOUNTER — Ambulatory Visit: Payer: Self-pay | Admitting: Cardiovascular Disease

## 2021-12-12 ENCOUNTER — Other Ambulatory Visit: Payer: Self-pay

## 2021-12-18 ENCOUNTER — Other Ambulatory Visit: Payer: Self-pay

## 2022-01-24 ENCOUNTER — Other Ambulatory Visit: Payer: Self-pay

## 2022-02-23 ENCOUNTER — Other Ambulatory Visit: Payer: Self-pay | Admitting: Family Medicine

## 2022-02-23 ENCOUNTER — Other Ambulatory Visit (HOSPITAL_COMMUNITY): Payer: Self-pay

## 2022-02-23 DIAGNOSIS — K219 Gastro-esophageal reflux disease without esophagitis: Secondary | ICD-10-CM

## 2022-02-23 MED ORDER — OMEPRAZOLE 20 MG PO CPDR
20.0000 mg | DELAYED_RELEASE_CAPSULE | Freq: Every day | ORAL | 2 refills | Status: DC
  Filled 2022-02-23: qty 30, 30d supply, fill #0
  Filled 2022-06-05 – 2022-06-12 (×2): qty 30, 30d supply, fill #1
  Filled 2022-07-20: qty 30, 30d supply, fill #2

## 2022-02-23 NOTE — Telephone Encounter (Signed)
Requested Prescriptions  Pending Prescriptions Disp Refills  . omeprazole (PRILOSEC) 20 MG capsule 30 capsule 3    Sig: Take 1 capsule (20 mg total) by mouth daily.     Gastroenterology: Proton Pump Inhibitors Passed - 02/23/2022 10:45 AM      Passed - Valid encounter within last 12 months    Recent Outpatient Visits          7 months ago Essential hypertension   [place], [place]   8 months ago Essential hypertension   [place], [place]   1 year ago Lipoma of other specified sites   [place], [place], MD   1 year ago Insomnia, unspecified type   [place], Dionne Bucy, [place]   1 year ago Essential hypertension   [place], RPH-CPP

## 2022-02-24 ENCOUNTER — Other Ambulatory Visit (HOSPITAL_COMMUNITY): Payer: Self-pay

## 2022-02-26 ENCOUNTER — Other Ambulatory Visit (HOSPITAL_COMMUNITY): Payer: Self-pay

## 2022-03-02 ENCOUNTER — Other Ambulatory Visit (HOSPITAL_COMMUNITY): Payer: Self-pay

## 2022-03-02 ENCOUNTER — Other Ambulatory Visit: Payer: Self-pay

## 2022-04-05 ENCOUNTER — Encounter (HOSPITAL_COMMUNITY): Payer: Self-pay

## 2022-04-05 ENCOUNTER — Other Ambulatory Visit: Payer: Self-pay

## 2022-04-05 ENCOUNTER — Emergency Department (HOSPITAL_COMMUNITY)
Admission: EM | Admit: 2022-04-05 | Discharge: 2022-04-05 | Disposition: A | Discharge status: Home / Self Care | Payer: Self-pay | Attending: Emergency Medicine | Admitting: Emergency Medicine

## 2022-04-05 ENCOUNTER — Emergency Department (HOSPITAL_COMMUNITY): Payer: Self-pay

## 2022-04-05 DIAGNOSIS — R000 Tachycardia, unspecified: Secondary | ICD-10-CM | POA: Insufficient documentation

## 2022-04-05 DIAGNOSIS — E876 Hypokalemia: Secondary | ICD-10-CM

## 2022-04-05 DIAGNOSIS — Z79899 Other long term (current) drug therapy: Secondary | ICD-10-CM | POA: Insufficient documentation

## 2022-04-05 DIAGNOSIS — Z20822 Contact with and (suspected) exposure to covid-19: Secondary | ICD-10-CM | POA: Insufficient documentation

## 2022-04-05 DIAGNOSIS — I10 Essential (primary) hypertension: Secondary | ICD-10-CM | POA: Insufficient documentation

## 2022-04-05 DIAGNOSIS — M791 Myalgia, unspecified site: Secondary | ICD-10-CM | POA: Insufficient documentation

## 2022-04-05 DIAGNOSIS — J3489 Other specified disorders of nose and nasal sinuses: Secondary | ICD-10-CM | POA: Insufficient documentation

## 2022-04-05 DIAGNOSIS — R509 Fever, unspecified: Secondary | ICD-10-CM | POA: Insufficient documentation

## 2022-04-05 DIAGNOSIS — R059 Cough, unspecified: Secondary | ICD-10-CM | POA: Insufficient documentation

## 2022-04-05 DIAGNOSIS — J069 Acute upper respiratory infection, unspecified: Secondary | ICD-10-CM

## 2022-04-05 DIAGNOSIS — J029 Acute pharyngitis, unspecified: Secondary | ICD-10-CM | POA: Insufficient documentation

## 2022-04-05 LAB — URINALYSIS, ROUTINE W REFLEX MICROSCOPIC
Bilirubin Urine: NEGATIVE
Glucose, UA: NEGATIVE mg/dL
Ketones, ur: NEGATIVE mg/dL
Leukocytes,Ua: NEGATIVE
Nitrite: NEGATIVE
Protein, ur: NEGATIVE mg/dL
Specific Gravity, Urine: 1.008 (ref 1.005–1.030)
pH: 6 (ref 5.0–8.0)

## 2022-04-05 LAB — CBC WITH DIFFERENTIAL/PLATELET
Abs Immature Granulocytes: 0.1 10*3/uL — ABNORMAL HIGH (ref 0.00–0.07)
Basophils Absolute: 0 10*3/uL (ref 0.0–0.1)
Basophils Relative: 0 %
Eosinophils Absolute: 0 10*3/uL (ref 0.0–0.5)
Eosinophils Relative: 0 %
HCT: 43.8 % (ref 36.0–46.0)
Hemoglobin: 14.3 g/dL (ref 12.0–15.0)
Immature Granulocytes: 1 %
Lymphocytes Relative: 4 %
Lymphs Abs: 0.8 10*3/uL (ref 0.7–4.0)
MCH: 28.7 pg (ref 26.0–34.0)
MCHC: 32.6 g/dL (ref 30.0–36.0)
MCV: 87.8 fL (ref 80.0–100.0)
Monocytes Absolute: 1 10*3/uL (ref 0.1–1.0)
Monocytes Relative: 5 %
Neutro Abs: 18.4 10*3/uL — ABNORMAL HIGH (ref 1.7–7.7)
Neutrophils Relative %: 90 %
Platelets: 266 10*3/uL (ref 150–400)
RBC: 4.99 MIL/uL (ref 3.87–5.11)
RDW: 13.4 % (ref 11.5–15.5)
WBC: 20.3 10*3/uL — ABNORMAL HIGH (ref 4.0–10.5)
nRBC: 0 % (ref 0.0–0.2)

## 2022-04-05 LAB — PROTIME-INR
INR: 1 (ref 0.8–1.2)
Prothrombin Time: 13.5 seconds (ref 11.4–15.2)

## 2022-04-05 LAB — COMPREHENSIVE METABOLIC PANEL
ALT: 24 U/L (ref 0–44)
AST: 36 U/L (ref 15–41)
Albumin: 3.8 g/dL (ref 3.5–5.0)
Alkaline Phosphatase: 64 U/L (ref 38–126)
Anion gap: 10 (ref 5–15)
BUN: 10 mg/dL (ref 6–20)
CO2: 22 mmol/L (ref 22–32)
Calcium: 9.3 mg/dL (ref 8.9–10.3)
Chloride: 108 mmol/L (ref 98–111)
Creatinine, Ser: 0.7 mg/dL (ref 0.44–1.00)
GFR, Estimated: >60 mL/min (ref >60)
Glucose, Bld: 106 mg/dL — ABNORMAL HIGH (ref 70–99)
Potassium: 2.9 mmol/L — ABNORMAL LOW (ref 3.5–5.1)
Sodium: 140 mmol/L (ref 135–145)
Total Bilirubin: 0.7 mg/dL (ref 0.3–1.2)
Total Protein: 7.5 g/dL (ref 6.5–8.1)

## 2022-04-05 LAB — APTT: aPTT: 29 seconds (ref 24–36)

## 2022-04-05 LAB — TROPONIN I (HIGH SENSITIVITY): Troponin I (High Sensitivity): 3 ng/L (ref <18)

## 2022-04-05 LAB — LACTIC ACID, PLASMA: Lactic Acid, Venous: 1.2 mmol/L (ref 0.5–1.9)

## 2022-04-05 LAB — RESP PANEL BY RT-PCR (FLU A&B, COVID) ARPGX2
Influenza A by PCR: NEGATIVE
Influenza B by PCR: NEGATIVE
SARS Coronavirus 2 by RT PCR: NEGATIVE

## 2022-04-05 LAB — GROUP A STREP BY PCR: Group A Strep by PCR: NOT DETECTED

## 2022-04-05 MED ORDER — POTASSIUM CHLORIDE CRYS ER 20 MEQ PO TBCR
40.0000 meq | EXTENDED_RELEASE_TABLET | Freq: Once | ORAL | Status: AC
  Administered 2022-04-05: 40 meq via ORAL
  Filled 2022-04-05: qty 2

## 2022-04-05 MED ORDER — VANCOMYCIN HCL 1500 MG/300ML IV SOLN
1500.0000 mg | Freq: Once | INTRAVENOUS | Status: AC
  Administered 2022-04-05: 1500 mg via INTRAVENOUS
  Filled 2022-04-05: qty 300

## 2022-04-05 MED ORDER — LACTATED RINGERS IV BOLUS
1000.0000 mL | Freq: Once | INTRAVENOUS | Status: AC
  Administered 2022-04-05: 1000 mL via INTRAVENOUS

## 2022-04-05 MED ORDER — SODIUM CHLORIDE 0.9 % IV SOLN
2.0000 g | Freq: Once | INTRAVENOUS | Status: AC
  Administered 2022-04-05: 2 g via INTRAVENOUS
  Filled 2022-04-05: qty 12.5

## 2022-04-05 MED ORDER — MORPHINE SULFATE (PF) 4 MG/ML IV SOLN
4.0000 mg | Freq: Once | INTRAVENOUS | Status: AC
  Administered 2022-04-05: 4 mg via INTRAVENOUS
  Filled 2022-04-05: qty 1

## 2022-04-05 MED ORDER — LACTATED RINGERS IV BOLUS (SEPSIS)
1000.0000 mL | Freq: Once | INTRAVENOUS | Status: AC
  Administered 2022-04-05: 1000 mL via INTRAVENOUS

## 2022-04-05 MED ORDER — VANCOMYCIN HCL IN DEXTROSE 1-5 GM/200ML-% IV SOLN: 1000.0000 mg | Freq: Once | INTRAVENOUS | Status: DC

## 2022-04-05 MED ORDER — ACETAMINOPHEN 500 MG PO TABS
1000.0000 mg | ORAL_TABLET | Freq: Once | ORAL | Status: AC
  Administered 2022-04-05: 1000 mg via ORAL
  Filled 2022-04-05: qty 2

## 2022-04-05 MED ORDER — ONDANSETRON HCL 4 MG/2ML IJ SOLN
4.0000 mg | Freq: Once | INTRAMUSCULAR | Status: AC
  Administered 2022-04-05: 4 mg via INTRAVENOUS
  Filled 2022-04-05: qty 2

## 2022-04-05 MED ORDER — ACETAMINOPHEN 325 MG PO TABS
650.0000 mg | ORAL_TABLET | Freq: Once | ORAL | Status: AC
Start: 2022-04-05 — End: 2022-04-05
  Administered 2022-04-05: 650 mg via ORAL
  Filled 2022-04-05: qty 2

## 2022-04-05 MED ORDER — LACTATED RINGERS IV SOLN: INTRAVENOUS | Status: DC

## 2022-04-05 MED ORDER — KETOROLAC TROMETHAMINE 15 MG/ML IJ SOLN
15.0000 mg | Freq: Once | INTRAMUSCULAR | Status: AC
  Administered 2022-04-05: 15 mg via INTRAVENOUS
  Filled 2022-04-05: qty 1

## 2022-04-05 NOTE — ED Notes (Signed)
Nurse notified of critical value on [facility] 8

## 2022-04-05 NOTE — Sepsis Progress Note (Signed)
Notified bedside nurse of need to administer antibiotics and collect a lactate.

## 2022-04-05 NOTE — ED Notes (Signed)
Lab said to recollect 2 light green tubes. They hemolyzed.Informed [place],RN, nurse calling lab to verify.

## 2022-04-05 NOTE — ED Triage Notes (Signed)
Pt reports fever, chills, body aches, productive cough, and sore throat since this morning.

## 2022-04-05 NOTE — Progress Notes (Signed)
A consult was received from an ED physician for vancomycin & Cefepime per pharmacy dosing.  The patient's profile has been reviewed for ht/wt/allergies/indication/available labs.    A one time order has been placed for Vancomycin 1500 mg & Cefepime 2 gm.    Further antibiotics/pharmacy consults should be ordered by admitting physician if indicated.                       Thank you,  Eudelia Bunch, Pharm.D 04/05/2022 5:52 PM

## 2022-04-05 NOTE — ED Provider Notes (Signed)
[place] DEPT Provider Note   CSN: [id number] Arrival date & time: 04/05/22  1522     History  Chief Complaint  Patient presents with   Fever   Generalized Body Aches   Cough   Sore Throat    Becky Koch is a 51 y.o. female.  Patient is a 51 year old female with a past medical history of hypertension presenting to the emergency department with fever, body aches and sore throat.  She states that she was feeling well yesterday and woke up with her symptoms today.  She states initially her temperature was 99 and in the afternoon it was rising which prompted her to come to the ER.  She states that she has a mild nonproductive cough.  She denies any chest pain or shortness of breath.  She denies any nausea, vomiting, diarrhea or constipation, dysuria or hematuria or abdominal pain.  She denies any known sick contacts.  She denies any rashes.  The history is provided by the patient.  Fever Associated symptoms: cough   Cough Associated symptoms: fever   Sore Throat       Home Medications Prior to Admission medications   Medication Sig Start Date End Date Taking? Authorizing Provider  amLODipine (NORVASC) 10 MG tablet Take 1 tablet (10 mg total) by mouth daily. 07/19/21   Ladell Pier, MD  [facility] POTASSIUM GLUCONATE PO Take 1 tablet by mouth every other day.    [provider]  estradiol (ESTRACE) 2 MG tablet Take 1 tablet (2 mg total) by mouth daily 07/19/21   Ladell Pier, MD  hydrochlorothiazide (HYDRODIURIL) 25 MG tablet Take 1 tablet (25 mg total) by mouth daily. 07/19/21   Ladell Pier, MD  metoprolol succinate (TOPROL-XL) 25 MG 24 hr tablet Take 1 tablet (25 mg total) by mouth daily. 07/19/21   Ladell Pier, MD  mirtazapine (REMERON) 30 MG tablet Take 1 tablet (30 mg total) by mouth at bedtime. 07/19/21   Ladell Pier, MD  omeprazole (PRILOSEC) 20 MG capsule Take 1 capsule by mouth daily. 02/23/22    Ladell Pier, MD  terbinafine (LAMISIL) 250 MG tablet Take 1 tablet (250 mg total) by mouth daily. 06/13/21   Mayers, Cari S, PA-C  cetirizine (ZYRTEC) 10 MG tablet Take 1 tablet (10 mg total) by mouth daily. Patient not taking: Reported on 09/15/2019 12/05/18 12/22/19  Kerin Perna, NP  traZODone (DESYREL) 50 MG tablet Take 0.5-1 tablets (25-50 mg total) by mouth at bedtime as needed for sleep. Patient not taking: Reported on 12/22/2019 02/18/19 12/22/19  Argentina Donovan, PA-C      Allergies    Patient has no known allergies.    Review of Systems   Review of Systems  Constitutional:  Positive for fever.  Respiratory:  Positive for cough.     Physical Exam Updated Vital Signs BP 124/76   Pulse (!) 110   Temp (!) 103.4 F (39.7 C) (Rectal)   Resp 18   Ht 5' 5.5" (1.664 m)   Wt 73.9 kg   LMP 07/24/2019   SpO2 98%   BMI 26.71 kg/m  Physical Exam Constitutional:      General: She is not in acute distress.    Appearance: She is well-developed.  HENT:     Head: Normocephalic and atraumatic.     Nose: Rhinorrhea present.     Comments: Erythematous turbinates     Mouth/Throat:     Mouth: Mucous membranes are  dry.     Pharynx: Oropharynx is clear. Uvula midline. Posterior oropharyngeal erythema present. No pharyngeal swelling, oropharyngeal exudate or uvula swelling.     Tonsils: No tonsillar exudate. 0 on the right. 0 on the left.     Comments: Tolerating secretions, normal phonations, no trismus Eyes:     Conjunctiva/sclera: Conjunctivae normal.     Pupils: Pupils are equal, round, and reactive to light.  Cardiovascular:     Rate and Rhythm: Regular rhythm. Tachycardia present.     Heart sounds: Normal heart sounds.  Pulmonary:     Effort: Pulmonary effort is normal.     Breath sounds: Normal breath sounds.  Abdominal:     Palpations: Abdomen is soft.     Tenderness: There is no abdominal tenderness.  Musculoskeletal:     Cervical back: Normal range of motion and  neck supple.  Lymphadenopathy:     Cervical: No cervical adenopathy.  Skin:    General: Skin is warm and dry.     Findings: No rash.  Neurological:     General: No focal deficit present.     Mental Status: She is alert and oriented to person, place, and time.  Psychiatric:        Mood and Affect: Mood normal.        Behavior: Behavior normal.     ED Results / Procedures / Treatments   Labs (all labs ordered are listed, but only abnormal results are displayed) Labs Reviewed  CBC WITH DIFFERENTIAL/PLATELET - Abnormal; Notable for the following components:      Result Value   WBC 20.3 (*)    Neutro Abs 18.4 (*)    Abs Immature Granulocytes 0.10 (*)    All other components within normal limits  I-STAT CHEM 8, ED - Abnormal; Notable for the following components:   Sodium 134 (*)    Potassium 7.9 (*)    Glucose, Bld 105 (*)    Calcium, Ion 1.04 (*)    Hemoglobin 15.6 (*)    All other components within normal limits  RESP PANEL BY RT-PCR (FLU A&B, COVID) ARPGX2  GROUP A STREP BY PCR  URINE CULTURE  CULTURE, BLOOD (ROUTINE X 2)  CULTURE, BLOOD (ROUTINE X 2)  PROTIME-INR  APTT  LACTIC ACID, PLASMA  URINALYSIS, ROUTINE W REFLEX MICROSCOPIC  LACTIC ACID, PLASMA  COMPREHENSIVE METABOLIC PANEL  POTASSIUM  I-STAT BETA HCG BLOOD, ED ([facility], [facility], [facility] ONLY)  TROPONIN I (HIGH SENSITIVITY)  TROPONIN I (HIGH SENSITIVITY)    EKG EKG Interpretation  Date/Time:  Thursday April 05 2022 16:44:53 EDT Ventricular Rate:  118 PR Interval:  148 QRS Duration: 92 QT Interval:  304 QTC Calculation: 426 R Axis:   48 Text Interpretation: Sinus tachycardia  Inferior T wave inversions not significantly changed, new septal T wave inversions compared to prior EKG Confirmed by Oneal Deputy [phone]) on 04/05/2022 5:03:48 PM  Radiology DG Chest 2 View  Result Date: 04/05/2022 CLINICAL DATA:  Cough, fever. EXAM: CHEST - 2 VIEW COMPARISON:  A fourth 2021. FINDINGS: The heart size and mediastinal  contours are within normal limits. Both lungs are clear. The visualized skeletal structures are unremarkable. IMPRESSION: No active cardiopulmonary disease. Electronically Signed   By: Marijo Conception M.D.   On: 04/05/2022 16:34    Procedures Procedures    Medications Ordered in ED Medications  lactated ringers infusion (has no administration in time range)  lactated ringers bolus 1,000 mL (has no administration in time range)  vancomycin (VANCOREADY)  IVPB 1500 mg/300 mL (has no administration in time range)  acetaminophen (TYLENOL) tablet 1,000 mg (1,000 mg Oral Given 04/05/22 1641)  lactated ringers bolus 1,000 mL (0 mLs Intravenous Stopped 04/05/22 1819)  ondansetron (ZOFRAN) injection 4 mg (4 mg Intravenous Given 04/05/22 1720)  ketorolac (TORADOL) 15 MG/ML injection 15 mg (15 mg Intravenous Given 04/05/22 1716)  ceFEPIme (MAXIPIME) 2 g in sodium chloride 0.9 % 100 mL IVPB (2 g Intravenous New Bag/Given 04/05/22 1819)    ED Course/ Medical Decision Making/ A&P Clinical Course as of 04/05/22 2307  Thu Apr 05, 2022  1659 Potassium(!!): 7.9 [VK]  1659 Potassium 7.9 on i-STAT, likely hemolyzed as the patient has no kidney disease.  Repeat potassium will be ordered.  EKG without hyperkalemic changes [VK]  2041 Pete potassium is low at 2.9 and she will be repleted with oral potassium.  Lactate is normal and troponin is normal.  Urine is pending at this time to evaluate for possible source of her infection.  Viral testing is negative. [VK]  2304 Patient's repeat temperature is elevated to 38.8 and she is due for repeat dose of Tylenol.  This is likely driving her tachycardia as she has been tolerating p.o.  She states that she is still having body aches but denies any chest pain or abdominal pain and overall feels improved from arrival.  Use shared decision making with the patient and she would prefer discharge home and that she would come back should her blood cultures grow positive.  She states  that she has primary care follow-up already scheduled for Monday. [VK]    Clinical Course User Index [VK] Ottie Glazier, DO                           Medical Decision Making This patient presents to the ED with chief complaint(s) of fever, body aches, sore throat with pertinent past medical history of hyper tension which further complicates the presenting complaint. The complaint involves an extensive differential diagnosis and also carries with it a high risk of complications and morbidity.    The differential diagnosis includes sepsis, pneumonia, viral syndrome, ACS, arrhythmia  Additional history obtained: Additional history obtained from N/A Records reviewed Care Everywhere/External Records   Independent labs interpretation:  The following labs were independently interpreted: Elevated white count of 20, otherwise negative for source of infection and mildly hypokalemic  Independent visualization of imaging: - I independently visualized the following imaging with scope of interpretation limited to determining acute life threatening conditions related to emergency care: Chest x-ray, which revealed no acute disease  Consultation: - Consulted or discussed management/test interpretation w/ external professional: N/A  Consideration for admission or further workup: Considered admission for possible sepsis to follow cultures, however using shared decision making with the patient, she would prefer to be discharged home and return should her blood cultures grow back positive. Social Determinants of health:N/A    Amount and/or Complexity of Data Reviewed Labs:  Decision-making details documented in ED Course.  Risk OTC drugs. Prescription drug management.           Final Clinical Impression(s) / ED Diagnoses Final diagnoses:  None    Rx / DC Orders ED Discharge Orders     None         Ottie Glazier, DO 04/05/22 2308

## 2022-04-05 NOTE — Sepsis Progress Note (Signed)
Notified provider of need to order antibiotics.   

## 2022-04-05 NOTE — Sepsis Progress Note (Signed)
Notified bedside nurse of need to draw lactic acid and Blood Cultures.. 

## 2022-04-05 NOTE — ED Provider Triage Note (Signed)
Emergency Medicine Provider Triage Evaluation Note  Becky Koch , a 51 y.o. female  was evaluated in triage.  Pt complains of fever, cough, body aches, and some nausea since this morning.  No recent sick contacts or travel.  Denies vomiting or diarrhea.  Denies shortness of breath or chest pain.  Denies neck stiffness, headache, or neck pain.  Review of Systems  Positive:  Negative: See above  Physical Exam  BP (!) 164/96   Pulse (!) 138   Temp (!) 102.7 F (39.3 C) (Oral)   Resp (!) 22   Ht 5' 5.5" (1.664 m)   Wt 73.9 kg   LMP 07/24/2019   SpO2 100%   BMI 26.71 kg/m  Gen:   Awake, appears clinically fatigued Resp:  Normal effort mild wheeze MSK:   Moves extremities without difficulty  Other:  Abdomen soft, with subjective nausea with palpation.  Erythema of the posterior oropharynx without uvular deviation.  Airway patent.  Neck very supple, no meningismus.  Medical Decision Making  Medically screening exam initiated at 4:20 PM.  Appropriate orders placed.  Alishea KALIYAH GLADMAN was informed that the remainder of the evaluation will be completed by another provider, this initial triage assessment does not replace that evaluation, and the importance of remaining in the ED until their evaluation is complete.  Patient meets sepsis criteria, code sepsis initiated.  Tylenol ordered.   Prince Rome, PA-C 64/68/03 1622

## 2022-04-05 NOTE — Discharge Instructions (Addendum)
You were seen in the emergency department for your fever and your body aches.  Your work-up here did not show an obvious source of your fever and showed no signs of urinary tract infection, pneumonia and your COVID and flu test were negative.  We gave you fluids in the emergency department to hydrate you and treated your fever and body aches with Tylenol and NSAIDs.  You should continue to take Tylenol and Motrin as needed for your fevers and body aches.  Both can be taken up to every 6 hours as needed.  Your last dose of Tylenol here was at 11 PM and you will be due for another dose of Motrin at 1 AM.  You should follow-up with your primary doctor in the next few days to have your symptoms rechecked.  We did send blood cultures here so if these come back positive you should return to the emergency department for further treatment.  Your potassium level is also low here in the emergency department and we gave you repletion.  You should follow-up with your primary doctor to ensure that your potassium level is normalized.  You should additionally return to the emergency department if your having shortness of breath, abdominal pain, nausea or repetitive vomiting, you become confused or passed out or if you have any other new or concerning symptoms.

## 2022-04-05 NOTE — ED Notes (Signed)
Pt urine still needs to be collected.

## 2022-04-05 NOTE — Sepsis Progress Note (Signed)
ELink tracking the Code Sepsis. 

## 2022-04-07 LAB — URINE CULTURE: Culture: NO GROWTH

## 2022-04-09 ENCOUNTER — Other Ambulatory Visit: Payer: Self-pay

## 2022-04-09 ENCOUNTER — Other Ambulatory Visit: Payer: Self-pay | Admitting: Internal Medicine

## 2022-04-09 DIAGNOSIS — I10 Essential (primary) hypertension: Secondary | ICD-10-CM

## 2022-04-09 DIAGNOSIS — G4700 Insomnia, unspecified: Secondary | ICD-10-CM

## 2022-04-10 ENCOUNTER — Other Ambulatory Visit: Payer: Self-pay

## 2022-04-10 LAB — CULTURE, BLOOD (ROUTINE X 2)
Culture: NO GROWTH
Culture: NO GROWTH
Special Requests: ADEQUATE

## 2022-04-10 MED ORDER — MIRTAZAPINE 30 MG PO TABS
30.0000 mg | ORAL_TABLET | Freq: Every day | ORAL | 0 refills | Status: DC
  Filled 2022-04-10 – 2022-04-18 (×2): qty 30, 30d supply, fill #0

## 2022-04-10 MED ORDER — METOPROLOL SUCCINATE ER 25 MG PO TB24
25.0000 mg | ORAL_TABLET | Freq: Every day | ORAL | 0 refills | Status: DC
  Filled 2022-04-10 – 2022-04-18 (×2): qty 30, 30d supply, fill #0

## 2022-04-10 NOTE — Telephone Encounter (Signed)
Appointment scheduled 05/10/22- courtesy 30 day Rx given Requested Prescriptions  Pending Prescriptions Disp Refills  . metoprolol succinate (TOPROL-XL) 25 MG 24 hr tablet 30 tablet 0    Sig: Take 1 tablet (25 mg total) by mouth daily.     Cardiovascular:  Beta Blockers Failed - 04/09/2022  9:50 AM      Failed - Last BP in normal range    BP Readings from Last 1 Encounters:  04/05/22 (!) 144/94         Failed - Last Heart Rate in normal range    Pulse Readings from Last 1 Encounters:  04/05/22 (!) 119         Failed - Valid encounter within last 6 months    Recent Outpatient Visits          8 months ago Essential hypertension   [place], [place]   10 months ago Essential hypertension   [place], [place]   1 year ago Lipoma of other specified sites   [place], [place], MD   1 year ago Insomnia, unspecified type   [place], Dionne Bucy, [place]   1 year ago Essential hypertension   [place], R[facility]-CPP      Future Appointments            In 1 month Joya Gaskins Burnett Harry, MD [place]           . mirtazapine (REMERON) 30 MG tablet 30 tablet 0    Sig: Take 1 tablet (30 mg total) by mouth at bedtime.     Psychiatry: Antidepressants - mirtazapine Failed - 04/09/2022  9:50 AM      Failed - Valid encounter within last 6 months    Recent Outpatient Visits          8 months ago Essential hypertension   [place], [place]   10 months ago Essential hypertension   [place], [place]   1 year ago Lipoma of other specified sites   [place], Enobong, MD   1 year ago Insomnia, unspecified type   [place], Dionne Bucy, [place]   1 year ago Essential hypertension   [place], RPH-CPP      Future Appointments            In 1 month Joya Gaskins Burnett Harry, MD [place]

## 2022-04-11 ENCOUNTER — Other Ambulatory Visit: Payer: Self-pay

## 2022-04-12 ENCOUNTER — Other Ambulatory Visit: Payer: Self-pay

## 2022-04-16 ENCOUNTER — Encounter: Payer: Self-pay | Admitting: Critical Care Medicine

## 2022-04-16 ENCOUNTER — Other Ambulatory Visit: Payer: Self-pay

## 2022-04-16 DIAGNOSIS — E876 Hypokalemia: Secondary | ICD-10-CM

## 2022-04-16 NOTE — Telephone Encounter (Signed)
Becky Koch pt needs lab draw visit for BMP I put orders in

## 2022-04-18 ENCOUNTER — Other Ambulatory Visit: Payer: Self-pay

## 2022-04-19 LAB — I-STAT CHEM 8, ED
BUN: 13 mg/dL (ref 6–20)
Calcium, Ion: 1.04 mmol/L — ABNORMAL LOW (ref 1.15–1.40)
Chloride: 104 mmol/L (ref 98–111)
Creatinine, Ser: 0.6 mg/dL (ref 0.44–1.00)
Glucose, Bld: 105 mg/dL — ABNORMAL HIGH (ref 70–99)
HCT: 46 % (ref 36.0–46.0)
Hemoglobin: 15.6 g/dL — ABNORMAL HIGH (ref 12.0–15.0)
Potassium: 7.9 mmol/L (ref 3.5–5.1)
Sodium: 134 mmol/L — ABNORMAL LOW (ref 135–145)
TCO2: 24 mmol/L (ref 22–32)

## 2022-05-10 ENCOUNTER — Ambulatory Visit: Payer: Self-pay | Admitting: Critical Care Medicine

## 2022-05-31 ENCOUNTER — Ambulatory Visit: Payer: Self-pay | Admitting: Physician Assistant

## 2022-06-05 ENCOUNTER — Other Ambulatory Visit: Payer: Self-pay

## 2022-06-05 ENCOUNTER — Encounter (INDEPENDENT_AMBULATORY_CARE_PROVIDER_SITE_OTHER): Payer: Self-pay | Admitting: Primary Care

## 2022-06-05 ENCOUNTER — Other Ambulatory Visit: Payer: Self-pay | Admitting: Internal Medicine

## 2022-06-05 ENCOUNTER — Ambulatory Visit (INDEPENDENT_AMBULATORY_CARE_PROVIDER_SITE_OTHER): Payer: Self-pay | Admitting: Primary Care

## 2022-06-05 VITALS — BP 141/99 | HR 94 | Ht 65.5 in | Wt 150.0 lb

## 2022-06-05 DIAGNOSIS — L570 Actinic keratosis: Secondary | ICD-10-CM

## 2022-06-05 DIAGNOSIS — G4700 Insomnia, unspecified: Secondary | ICD-10-CM

## 2022-06-05 DIAGNOSIS — I10 Essential (primary) hypertension: Secondary | ICD-10-CM

## 2022-06-05 MED ORDER — AMLODIPINE BESYLATE 10 MG PO TABS
10.0000 mg | ORAL_TABLET | Freq: Every day | ORAL | 0 refills | Status: DC
  Filled 2022-06-05 – 2022-06-12 (×2): qty 30, 30d supply, fill #0
  Filled 2022-07-20: qty 30, 30d supply, fill #1
  Filled 2022-08-28: qty 30, 30d supply, fill #2

## 2022-06-05 MED ORDER — METOPROLOL SUCCINATE ER 25 MG PO TB24
25.0000 mg | ORAL_TABLET | Freq: Every day | ORAL | 0 refills | Status: DC
  Filled 2022-06-05 – 2022-06-12 (×2): qty 30, 30d supply, fill #0
  Filled 2022-07-20: qty 30, 30d supply, fill #1
  Filled 2022-08-28: qty 30, 30d supply, fill #2

## 2022-06-05 MED ORDER — MIRTAZAPINE 30 MG PO TABS
30.0000 mg | ORAL_TABLET | Freq: Every day | ORAL | 0 refills | Status: DC
  Filled 2022-06-05 – 2022-06-12 (×2): qty 30, 30d supply, fill #0
  Filled 2022-07-20: qty 30, 30d supply, fill #1
  Filled 2022-08-28: qty 30, 30d supply, fill #2

## 2022-06-05 NOTE — Progress Notes (Signed)
Radnor, is a 51 y.o. female  JJO:[id number]  [id number]  DOB - 04-Aug-1971  Chief Complaint  Patient presents with   Rash       Subjective:   Becky Koch is a 51 y.o. female here today for an acute visit.  PCP had no available appointments.  She is concerned about a rash that has developed 2 years ago and she was being dermatologist.  The initial rash was remove and was she is requesting to return to dermatology for reevaluation and treatment.  Note her blood pressure is elevated today however she was just given off of work and did not take her medications before appointment . Patient has No headache, No chest pain, No abdominal pain - No Nausea, No new weakness tingling or numbness, No Cough - shortness of breath  No problems updated.  No Known Allergies  Past Medical History:  Diagnosis Date   Allergy    Anemia    Blood transfusion without reported diagnosis    Fibroid, uterine    GERD (gastroesophageal reflux disease)    Hypertension     Current Outpatient Medications on File Prior to Visit  Medication Sig Dispense Refill   amLODipine (NORVASC) 10 MG tablet Take 1 tablet (10 mg total) by mouth daily. 30 tablet 6   [facility] POTASSIUM GLUCONATE PO Take 1 tablet by mouth every other day.     hydrochlorothiazide (HYDRODIURIL) 25 MG tablet Take 1 tablet (25 mg total) by mouth daily. 90 tablet 2   metoprolol succinate (TOPROL-XL) 25 MG 24 hr tablet Take 1 tablet (25 mg total) by mouth daily. 30 tablet 0   mirtazapine (REMERON) 30 MG tablet Take 1 tablet (30 mg total) by mouth at bedtime. 30 tablet 0   omeprazole (PRILOSEC) 20 MG capsule Take 1 capsule by mouth daily. 30 capsule 2   estradiol (ESTRACE) 2 MG tablet Take 1 tablet (2 mg total) by mouth daily 30 tablet 3   terbinafine (LAMISIL) 250 MG tablet Take 1 tablet (250 mg total) by mouth daily. 30 tablet 2   [DISCONTINUED] cetirizine (ZYRTEC) 10 MG tablet Take 1 tablet (10 mg total) by  mouth daily. (Patient not taking: Reported on 09/15/2019) 30 tablet 11   [DISCONTINUED] traZODone (DESYREL) 50 MG tablet Take 0.5-1 tablets (25-50 mg total) by mouth at bedtime as needed for sleep. (Patient not taking: Reported on 12/22/2019) 30 tablet 3   No current facility-administered medications on file prior to visit.    Objective:   Vitals:   06/05/22 1054  BP: (!) 141/99  Pulse: 94  SpO2: 99%  Weight: 150 lb (68 kg)  Height: 5' 5.5" (1.664 m)    Exam General appearance : Awake, alert, not in any distress. Speech Clear. Not toxic looking HEENT: Atraumatic and Normocephalic, pupils equally reactive to light and accomodation Neck: Supple, no JVD. No cervical lymphadenopathy.  Chest: Good air entry bilaterally, no added sounds  CVS: S1 S2 regular, no murmurs.  Abdomen: Bowel sounds present, Non tender and not distended with no gaurding, rigidity or rebound. Extremities: B/L Lower Ext shows no edema, both legs are warm to touch Neurology: Awake alert, and oriented X 3, CN II-XII intact, Non focal Skin: Rash  Data Review Lab Results  Component Value Date   HGBA1C 5.2 04/21/2020   HGBA1C 5.1 06/21/2016    Assessment & Plan  Becky Koch was seen today for rash.  Diagnoses and all orders for this visit:  Actinic keratoses -  Ambulatory referral to Dermatology      Ambulatory referral to Dermatology  Patient have been counseled extensively about nutrition and exercise. Other issues discussed during this visit include: low cholesterol diet, weight control and daily exercise, foot care, annual eye examinations at Ophthalmology, importance of adherence with medications and regular follow-up. We also discussed long term complications of uncontrolled diabetes and hypertension.   The patient was given clear instructions to go to ER or return to medical center if symptoms don't improve, worsen or new problems develop. The patient verbalized understanding. The patient was told to  call to get lab results if they haven't heard anything in the next week.   This note has been created with Surveyor, quantity. Any transcriptional errors are unintentional.   Kerin Perna, NP 06/05/2022, 11:21 AM

## 2022-06-05 NOTE — Progress Notes (Signed)
Has spots on back,has seen dermatology Having pain in right shoulder.

## 2022-06-05 NOTE — Patient Instructions (Signed)
Actinic Keratosis An actinic keratosis is a precancerous growth on the skin. If there is more than one, the condition is called actinic keratoses. These growths appear most often on parts of the skin that get a lot of sun exposure, including the: Scalp. Face. Ears. Lips. Upper back. Forearms. Backs of the hands. If left untreated, these growths may develop into a skin cancer called squamous cell carcinoma. It is important to have all these growths checked by a health care provider to determine the best treatment. What are the causes? Actinic keratoses are caused by getting too much ultraviolet (UV) radiation from the sun or other UV light sources. What increases the risk? You are more likely to develop this condition if you: Have light-colored skin or blue eyes. Have blond or red hair. Spend a lot of time in the sun. Do not protect your skin from the sun when outdoors. Are an older person. The risk of developing an actinic keratosis increases with age. What are the signs or symptoms? These growths feel like scaly, rough spots of skin. Symptoms of this condition include growths that may: Be as small as a pinhead or as big as a quarter. Itch, hurt, or feel sensitive. Be skin-colored, light tan, dark tan, pink, or a combination of these colors. In most cases, the growths become red. Have a small piece of pink or gray skin (skin tag) growing from them. It may be easier to notice the growths by feeling them rather than seeing them. Sometimes, actinic keratoses disappear but may return a few days to a few weeks later. How is this diagnosed? This condition is usually diagnosed with a physical exam. A tissue sample may be removed from the growth and examined under a microscope (biopsy). How is this treated? This condition may be treated by: Scraping off the actinic keratosis (curettage). Freezing the actinic keratosis with liquid nitrogen (cryosurgery). This causes the growth to eventually  fall off. Applying medicated creams or gels to destroy the cells in the growth. Applying chemicals to the growth to make the outer layers of skin peel off (chemical peel). Using photodynamic therapy. In this procedure, medicated cream is applied to the actinic keratosis. This cream increases your skin's sensitivity to light. Then, a strong light is aimed at the actinic keratosis to destroy cells in the growth. Follow these instructions at home: Skin care Apply cool, wet cloths (coolcompresses) to the affected areas. Do not scratch your skin. Check your skin regularly for any growths, especially ones that: Start to itch or bleed. Change in size, shape, or color. Caring for the treated area Keep the treated area clean and dry as told by your health care provider. Do not apply any medicine, cream, or lotion to the treated area unless your health care provider tells you to do that. Do not pick at blisters or try to break them open. This can cause infection and scarring. If you have red or irritated skin after treatment, follow instructions from your health care provider about how to take care of the treated area. Make sure you: Wash your hands with soap and water for at least 20 seconds before and after you change your bandage (dressing). If soap and water are not available, use hand sanitizer. Change your dressing as told by your health care provider. If you have red or irritated skin after treatment, check the treated area every day for signs of infection. Check for: Redness, swelling, or pain. Fluid or blood. Warmth. Pus or a bad  smell. Lifestyle Do not use any products that contain nicotine or tobacco. These products include cigarettes, chewing tobacco, and vaping devices, such as e-cigarettes. If you need help quitting, ask your health care provider. Take steps to protect your skin from the sun, such as: Avoiding the sun between 10:00 a.m. and 4:00 p.m. This is when the UV light is the  strongest. Using a sunscreen or sunblock with SPF 30 or greater. Applying sunscreen before you are exposed to sunlight and reapplying as often as told by the instructions on the sunscreen container. Wearing protective gear, including: Sunglasses with UV protection. A hat and clothing that protect your skin from sunlight. Avoiding medicines that increase your sensitivity to sunlight when possible. Avoidingtanning beds and other indoor tanning devices. General instructions Take or apply over-the-counter and prescription medicines only as told by your health care provider. Return to your normal activities as told by your health care provider. Ask your health care provider what activities are safe for you. Have a skin exam done every year by a health care provider who is a skin specialist (dermatologist). Keep all follow-up visits. Your health care provider will want to check that the site has healed after treatment. Contact a health care provider if: You notice any changes or new growths on your skin. You have swelling, pain, or redness around your treated area. You have fluid or blood coming from your treated area. Your treated area feels warm to the touch. You have pus or a bad smell coming from your treated area. You have a fever or chills. You have a blister that becomes large and painful. Summary An actinic keratosis is a precancerous growth on the skin.If left untreated, these growths can develop into skin cancer. Check your skin regularly for any growths, especially growths that start to itch or bleed, or change in size, shape, or color. Take steps to protect your skin from the sun. Contact a health care provider if you notice any changes or new growths on your skin. This information is not intended to replace advice given to you by your health care provider. Make sure you discuss any questions you have with your health care provider. Document Revised: 10/19/2021 Document Reviewed:  10/19/2021 Elsevier Patient Education  [place].

## 2022-06-05 NOTE — Telephone Encounter (Signed)
Requested Prescriptions  Pending Prescriptions Disp Refills  . amLODipine (NORVASC) 10 MG tablet 90 tablet 0    Sig: Take 1 tablet (10 mg total) by mouth daily.     Cardiovascular: Calcium Channel Blockers 2 Failed - 06/05/2022 10:01 AM      Failed - Last BP in normal range    BP Readings from Last 1 Encounters:  06/05/22 (!) 141/99         Failed - Last Heart Rate in normal range    Pulse Readings from Last 1 Encounters:  06/05/22 94         Failed - Valid encounter within last 6 months    Recent Outpatient Visits          Today Actinic keratoses   [place], Michelle P, NP   10 months ago Essential hypertension   [place], [place]   11 months ago Essential hypertension   [place], [place]   1 year ago Lipoma of other specified sites   [place], Enobong, MD   1 year ago Insomnia, unspecified type   [place], Dionne Bucy, [place]      Future Appointments            In 2 months Wynetta Emery, Dalbert Batman, MD [place]           . metoprolol succinate (TOPROL-XL) 25 MG 24 hr tablet 90 tablet 0    Sig: Take 1 tablet (25 mg total) by mouth daily.     Cardiovascular:  Beta Blockers Failed - 06/05/2022 10:01 AM      Failed - Last BP in normal range    BP Readings from Last 1 Encounters:  06/05/22 (!) 141/99         Failed - Last Heart Rate in normal range    Pulse Readings from Last 1 Encounters:  06/05/22 94         Failed - Valid encounter within last 6 months    Recent Outpatient Visits          Today Actinic keratoses   [place], Michelle P, NP   10 months ago Essential hypertension   [place], MD   11 months ago Essential hypertension   [place], [place]   1 year ago Lipoma of other specified sites   [place], Enobong, MD   1 year ago Insomnia, unspecified type   [place], Dionne Bucy, [place]      Future Appointments            In 2 months Wynetta Emery, Dalbert Batman, MD [place]           . mirtazapine (REMERON) 30 MG tablet 90 tablet 0    Sig: Take 1 tablet (30 mg total) by mouth at bedtime.     Psychiatry: Antidepressants - mirtazapine Failed - 06/05/2022 10:01 AM      Failed - Valid encounter within last 6 months    Recent Outpatient Visits          Today Actinic keratoses   [place] Kerin Perna, NP   10  months ago Essential hypertension   [place], MD   11 months ago Essential hypertension   [place], [place]   1 year ago Lipoma of other specified sites   [place], Enobong, MD   1 year ago Insomnia, unspecified type   [place], Dionne Bucy, [place]      Future Appointments            In 2 months Wynetta Emery, Dalbert Batman, MD [place]

## 2022-06-06 ENCOUNTER — Other Ambulatory Visit: Payer: Self-pay

## 2022-06-08 ENCOUNTER — Other Ambulatory Visit: Payer: Self-pay

## 2022-06-12 ENCOUNTER — Other Ambulatory Visit: Payer: Self-pay

## 2022-06-14 ENCOUNTER — Ambulatory Visit: Payer: Self-pay

## 2022-07-20 ENCOUNTER — Other Ambulatory Visit: Payer: Self-pay

## 2022-07-20 ENCOUNTER — Other Ambulatory Visit: Payer: Self-pay | Admitting: Internal Medicine

## 2022-07-20 DIAGNOSIS — I10 Essential (primary) hypertension: Secondary | ICD-10-CM

## 2022-07-20 MED ORDER — HYDROCHLOROTHIAZIDE 25 MG PO TABS
25.0000 mg | ORAL_TABLET | Freq: Every day | ORAL | 0 refills | Status: DC
  Filled 2022-07-20: qty 46, 46d supply, fill #0

## 2022-07-23 ENCOUNTER — Other Ambulatory Visit: Payer: Self-pay

## 2022-07-24 ENCOUNTER — Encounter: Payer: Self-pay | Admitting: Internal Medicine

## 2022-08-07 ENCOUNTER — Other Ambulatory Visit: Payer: Self-pay

## 2022-08-09 ENCOUNTER — Other Ambulatory Visit: Payer: Self-pay

## 2022-08-09 ENCOUNTER — Ambulatory Visit (INDEPENDENT_AMBULATORY_CARE_PROVIDER_SITE_OTHER): Payer: Self-pay | Admitting: Student

## 2022-08-09 VITALS — BP 105/80 | HR 84 | Ht 65.5 in | Wt 149.6 lb

## 2022-08-09 DIAGNOSIS — L989 Disorder of the skin and subcutaneous tissue, unspecified: Secondary | ICD-10-CM

## 2022-08-09 MED ORDER — TRIAMCINOLONE ACETONIDE 0.5 % EX OINT
1.0000 | TOPICAL_OINTMENT | Freq: Two times a day (BID) | CUTANEOUS | 2 refills | Status: DC
  Filled 2022-08-09 – 2022-08-24 (×2): qty 30, 15d supply, fill #0
  Filled 2022-10-31: qty 30, 15d supply, fill #1

## 2022-08-09 NOTE — Assessment & Plan Note (Signed)
Patient with 3 skin lesions that were thought to be AK, now more concerning for nummular eczema. Lesions have not been growing and are not on sun exposed area. Lesions are hyperpigmented with some crusting, well demarcated and don't look like psoriases, or tinea. Patient had bx performed in 2020 by Dr. Allyson Sabal, but results were never sent to clinic. Given inflammatory appearance of lesions/hx of pruritus, most concerning for eczema. Will treat with steroids. If lesions not improving will f/u with biopsy -Triamcinolone 0.5% BID for 2 months -F/u 2 months -Consider bx if lesions not improving -Patient to obtain path report from Dr. Allyson Sabal

## 2022-08-09 NOTE — Patient Instructions (Signed)
It was great to see you! Thank you for allowing me to participate in your care!  We are unsure what the lesions on your skin are, but we don't think the are cancerous/precancerous. Call the dermatologist who performed the biopsy and ask for the results/bring them back to our clinic office. We will give you an ointment to apply to the areas  Our plans for today:  - Dark spots  Apply steroid ointment to area twice a day, for 2 months  Avoid getting on other areas of skin  Follow up in 2 months with our clinic  - Biopsy results Call Dr. Allyson Sabal for biopsy results  Phone: [phone]  Address: [address], [address]  Take care and seek immediate care sooner if you develop any concerns.   Dr. Holley Bouche, MD [place]

## 2022-08-09 NOTE — Progress Notes (Signed)
  SUBJECTIVE:   CHIEF COMPLAINT / HPI:   F/u spots on right shoulder and back Has had spots for about 4 years. Haven't changed in size, but would itch off/on. Notes they've been itchier more recently. No bleeding or draining. Had biopsy of lesion on shoulder 4 years ago 03/05/2019.   PERTINENT  PMH / PSH:    OBJECTIVE:  BP 105/80   Pulse 84   Ht 5' 5.5" (1.664 m)   Wt 149 lb 9.6 oz (67.9 kg)   LMP 07/24/2019   SpO2 96%   BMI 24.52 kg/m  Skin: multiple hyperpigmented lesions with central crusting located on back (x2) and shoulder.   ASSESSMENT/PLAN:  Skin lesion Assessment & Plan: Patient with 3 skin lesions that were thought to be AK, now more concerning for nummular eczema. Lesions have not been growing and are not on sun exposed area. Lesions are hyperpigmented with some crusting, well demarcated and don't look like psoriases, or tinea. Patient had bx performed in 2020 by Dr. Allyson Sabal, but results were never sent to clinic. Given inflammatory appearance of lesions/hx of pruritus, most concerning for eczema. Will treat with steroids. If lesions not improving will f/u with biopsy -Triamcinolone 0.5% BID for 2 months -F/u 2 months -Consider bx if lesions not improving -Patient to obtain path report from Dr. Allyson Sabal   Other orders -     Triamcinolone Acetonide; Apply 1 Application topically 2 (two) times daily.  Dispense: 30 g; Refill: 2   No follow-ups on file. Holley Bouche, MD 08/09/2022, 11:49 AM PGY-2, [place]

## 2022-08-15 ENCOUNTER — Other Ambulatory Visit: Payer: Self-pay

## 2022-08-24 ENCOUNTER — Other Ambulatory Visit: Payer: Self-pay

## 2022-08-28 ENCOUNTER — Ambulatory Visit: Payer: Self-pay | Attending: Internal Medicine | Admitting: Internal Medicine

## 2022-08-28 ENCOUNTER — Other Ambulatory Visit: Payer: Self-pay

## 2022-08-28 ENCOUNTER — Other Ambulatory Visit: Payer: Self-pay | Admitting: Internal Medicine

## 2022-08-28 VITALS — BP 153/105 | HR 88 | Temp 98.7°F | Ht 65.0 in | Wt 156.0 lb

## 2022-08-28 DIAGNOSIS — I10 Essential (primary) hypertension: Secondary | ICD-10-CM

## 2022-08-28 DIAGNOSIS — F17200 Nicotine dependence, unspecified, uncomplicated: Secondary | ICD-10-CM

## 2022-08-28 DIAGNOSIS — Z23 Encounter for immunization: Secondary | ICD-10-CM

## 2022-08-28 DIAGNOSIS — G4700 Insomnia, unspecified: Secondary | ICD-10-CM

## 2022-08-28 DIAGNOSIS — Z1211 Encounter for screening for malignant neoplasm of colon: Secondary | ICD-10-CM

## 2022-08-28 DIAGNOSIS — Z1231 Encounter for screening mammogram for malignant neoplasm of breast: Secondary | ICD-10-CM

## 2022-08-28 DIAGNOSIS — M79644 Pain in right finger(s): Secondary | ICD-10-CM

## 2022-08-28 MED ORDER — CARVEDILOL 6.25 MG PO TABS
6.2500 mg | ORAL_TABLET | Freq: Two times a day (BID) | ORAL | 1 refills | Status: DC
  Filled 2022-08-28: qty 180, 90d supply, fill #0

## 2022-08-28 MED ORDER — MIRTAZAPINE 30 MG PO TABS
30.0000 mg | ORAL_TABLET | Freq: Every day | ORAL | 0 refills | Status: DC
  Filled 2022-08-28: qty 90, 90d supply, fill #0

## 2022-08-28 MED ORDER — ZOSTER VAC RECOMB ADJUVANTED 50 MCG/0.5ML IM SUSR
0.5000 mL | Freq: Once | INTRAMUSCULAR | 0 refills | Status: AC
  Filled 2022-09-20: qty 1, 1d supply, fill #0

## 2022-08-28 MED ORDER — VARENICLINE TARTRATE (STARTER) 0.5 MG X 11 & 1 MG X 42 PO TBPK
ORAL_TABLET | ORAL | 0 refills | Status: DC
  Filled 2022-08-28: qty 53, 28d supply, fill #0

## 2022-08-28 MED ORDER — AMLODIPINE BESYLATE 10 MG PO TABS
10.0000 mg | ORAL_TABLET | Freq: Every day | ORAL | 1 refills | Status: DC
  Filled 2022-08-28: qty 90, 90d supply, fill #0
  Filled 2022-12-12: qty 90, 90d supply, fill #1

## 2022-08-28 MED ORDER — HYDROCHLOROTHIAZIDE 25 MG PO TABS
25.0000 mg | ORAL_TABLET | Freq: Every day | ORAL | 1 refills | Status: DC
  Filled 2022-08-28: qty 90, 90d supply, fill #0
  Filled 2022-12-12: qty 90, 90d supply, fill #1

## 2022-08-28 MED ORDER — DICLOFENAC SODIUM 1 % EX GEL
2.0000 g | Freq: Two times a day (BID) | CUTANEOUS | 0 refills | Status: AC | PRN
  Filled 2022-08-28: qty 100, fill #0

## 2022-08-28 NOTE — Progress Notes (Signed)
Patient ID: Becky Koch, female    DOB: 1971/08/11  MRN: [id number]  CC: Hypertension (HTN f/u. Med refill. /Requesting blood work. /Sore knuckle on middle finger of R hand, lock, inflammation X2-3 mo./Already received flu vax. )   Subjective: Becky Koch is a 52 y.o. female who presents for chronic ds management Her concerns today include:  Patient with history of HTN, elev TG, GERD, tob dep,    HTN;  taking meds as prescribed.  She is supposed to be on metoprolol 25 mg daily, amlodipine 10 mg daily and hydrochlorothiazide 25 mg daily. BP device no longer works Do try to limit salt in foods.  Had some BBQ today No CP/SOB  Tob dep:  has not purchased cigarette since new yr.  She was smoking 1 pk Q 2 days.  Smoked for 30 yrs.  Tried quitting before.  Lasted 3-4 days.   She purchase tobacco base vape to use PRN.  Trying to quit smoking.  Complains of soreness at the DIP joint of the right middle finger x 2 weeks.  Had some swelling of the finger pad.  This resolved but soreness in the joint remains.  She has not noticed any increased redness.  She also has noted that her fingernails are becoming dark gradually over time.  Wonders if she is not getting enough oxygen.  Does not use artificial/press on nails.  Requests refill on Remeron.  Has not had any weight gain on the medication.  As a matter fact she is down about 10 pounds from the last time I saw her.  HM:  due for MMG and colon cancer screening.  Due for shingles vaccine. Patient Active Problem List   Diagnosis Date Noted   Skin lesion 08/09/2022   Subcutaneous cyst 07/19/2021   Hypertriglyceridemia 06/14/2021   Insomnia 06/14/2021   Subcutaneous mass of abdominal wall 06/14/2021   Onychomycosis 06/14/2021   Gastroesophageal reflux disease without esophagitis 06/14/2021   Palpitations 06/15/2020   SBO (small bowel obstruction) ([place]) 08/02/2019   DUB (dysfunctional uterine bleeding) 07/28/2019   Post-operative state  07/28/2019   Gastroesophageal reflux disease with esophagitis 12/05/2018   Vapes nicotine containing substance 06/23/2018   Actinic keratoses 05/02/2017   Essential hypertension 07/14/2012     Current Outpatient Medications on File Prior to Visit  Medication Sig Dispense Refill   [facility] POTASSIUM GLUCONATE PO Take 1 tablet by mouth every other day.     omeprazole (PRILOSEC) 20 MG capsule Take 1 capsule by mouth daily. 30 capsule 2   triamcinolone ointment (KENALOG) 0.5 % Apply 1 Application topically 2 (two) times daily. 30 g 2   No current facility-administered medications on file prior to visit.    No Known Allergies  Social History   Socioeconomic History   Marital status: Legally Separated    Spouse name: Not on file   Number of children: Not on file   Years of education: Not on file   Highest education level: Not on file  Occupational History   Not on file  Tobacco Use   Smoking status: Every Day    Types: E-cigarettes   Smokeless tobacco: Never  Vaping Use   Vaping Use: Never used  Substance and Sexual Activity   Alcohol use: Yes    Comment: social alcohol   Drug use: No   Sexual activity: Yes    Birth control/protection: Surgical  Other Topics Concern   Not on file  Social History Narrative   Not on file  Social Determinants of Health   Financial Resource Strain: Not on file  Food Insecurity: Not on file  Transportation Needs: Not on file  Physical Activity: Not on file  Stress: Not on file  Social Connections: Not on file  Intimate Partner Violence: Not on file    Family History  Problem Relation Age of Onset   Cancer Paternal Grandfather    Colon cancer Neg Hx    Colon polyps Neg Hx    Esophageal cancer Neg Hx    Rectal cancer Neg Hx    Stomach cancer Neg Hx     Past Surgical History:  Procedure Laterality Date   LAPAROSCOPIC LYSIS OF ADHESIONS N/A 08/03/2019   Procedure: Laparoscopic Lysis Of Adhesions;  Surgeon: [name], MD;   Location: [facility];  Service: General;  Laterality: N/A;   LAPAROSCOPY N/A 08/03/2019   Procedure: LAPAROSCOPY DIAGNOSTIC;  Surgeon: [name], MD;  Location: [facility];  Service: General;  Laterality: N/A;   TUBAL LIGATION     VAGINAL HYSTERECTOMY Bilateral 07/28/2019   Procedure: HYSTERECTOMY VAGINAL WITH BILATERAL SALPINGECTOMY;  Surgeon: Emily Filbert, MD;  Location: [facility];  Service: Gynecology;  Laterality: Bilateral;    ROS: Review of Systems Negative except as stated above  PHYSICAL EXAM: BP (!) 153/105 (BP Location: Left Arm, Patient Position: Sitting, Cuff Size: Normal)   Pulse 88   Temp 98.7 F (37.1 C) (Oral)   Ht '5\' 5"'$  (1.651 m)   Wt 156 lb (70.8 kg)   LMP 07/24/2019   SpO2 97%   BMI 25.96 kg/m   Wt Readings from Last 3 Encounters:  08/28/22 156 lb (70.8 kg)  08/09/22 149 lb 9.6 oz (67.9 kg)  06/05/22 150 lb (68 kg)    Physical Exam  General appearance - alert, well appearing, middle-age female and in no distress Mental status - normal mood, behavior, speech, dress, motor activity, and thought processes Neck - supple, no significant adenopathy Chest - clear to auscultation, no wheezes, rales or rhonchi, symmetric air entry Heart - normal rate, regular rhythm, normal S1, S2, no murmurs, rubs, clicks or gallops Extremities - peripheral pulses normal, no pedal edema, no clubbing or cyanosis MSK: Mild swelling of the right DIP joint middle finger.  She has good range of motion of the finger. Mild hyperpigmentation of all the fingernails.     Latest Ref Rng & Units 04/05/2022    7:05 PM 04/05/2022    4:46 PM 01/25/2021   10:18 AM  CMP  Glucose 70 - 99 mg/dL 106  105  90   BUN 6 - 20 mg/dL '10  13  8   '$ Creatinine 0.44 - 1.00 mg/dL 0.70  0.60  0.68   Sodium 135 - 145 mmol/L 140  134  139   Potassium 3.5 - 5.1 mmol/L 2.9  7.9  4.5   Chloride 98 - 111 mmol/L 108  104  104   CO2 22 - 32 mmol/L 22   21   Calcium 8.9 - 10.3 mg/dL 9.3   9.7   Total  Protein 6.5 - 8.1 g/dL 7.5   7.6   Total Bilirubin 0.3 - 1.2 mg/dL 0.7   <0.2   Alkaline Phos 38 - 126 U/L 64   75   AST 15 - 41 U/L 36   39   ALT 0 - 44 U/L 24   22    Lipid Panel     Component Value Date/Time   CHOL 154 01/25/2021 1018  TRIG 301 (H) 01/25/2021 1018   HDL 38 (L) 01/25/2021 1018   CHOLHDL 4.1 01/25/2021 1018   CHOLHDL 4.3 06/21/2016 0952   VLDL 22 06/21/2016 0952   LDLCALC 68 01/25/2021 1018    CBC    Component Value Date/Time   WBC 20.3 (H) 04/05/2022 1618   RBC 4.99 04/05/2022 1618   HGB 15.6 (H) 04/05/2022 1646   HGB 14.6 02/18/2019 1559   HCT 46.0 04/05/2022 1646   HCT 44.9 02/18/2019 1559   PLT 266 04/05/2022 1618   PLT 363 02/18/2019 1559   MCV 87.8 04/05/2022 1618   MCV 91 02/18/2019 1559   MCH 28.7 04/05/2022 1618   MCHC 32.6 04/05/2022 1618   RDW 13.4 04/05/2022 1618   RDW 12.5 02/18/2019 1559   LYMPHSABS 0.8 04/05/2022 1618   LYMPHSABS 2.2 02/18/2019 1559   MONOABS 1.0 04/05/2022 1618   EOSABS 0.0 04/05/2022 1618   EOSABS 0.1 02/18/2019 1559   BASOSABS 0.0 04/05/2022 1618   BASOSABS 0.0 02/18/2019 1559    ASSESSMENT AND PLAN: 1. Essential hypertension Not at goal.  We discussed adding Diovan but patient does not want another medication at this time.  Therefore I will have her stop the metoprolol and we will put her on carvedilol instead for better blood pressure lowering effect.  Continue amlodipine and hydrochlorothiazide.  DASH diet encouraged. - amLODipine (NORVASC) 10 MG tablet; Take 1 tablet (10 mg total) by mouth daily.  Dispense: 90 tablet; Refill: 1 - hydrochlorothiazide (HYDRODIURIL) 25 MG tablet; Take 1 tablet (25 mg total) by mouth daily.  Dispense: 90 tablet; Refill: 1 - carvedilol (COREG) 6.25 MG tablet; Take 1 tablet (6.25 mg total) by mouth 2 (two) times daily with a meal.  Dispense: 180 tablet; Refill: 1 - Comprehensive metabolic panel; Future - Lipid panel; Future  2. Tobacco dependence Pt is current smoker. Patient  advised to quit smoking. Discussed health risks associated with smoking including lung and other types of cancers, chronic lung diseases and CV risks.. Pt ready to give trail of quitting.   Discussed methods to help quit including quitting cold [place], use of NRT, Chantix and Bupropion.  Patient wanting to try Chantix.  Went over with her how the medication works and possible side effects including mood swings and bad dreams. Pt wanting to try: Start Chantix starter pack. 3__ Minutes spent on counseling. F/U: Assess progress on subsequent visit.  - Varenicline Tartrate, Starter, (CHANTIX STARTING MONTH PAK) 0.5 MG X 11 & 1 MG X 42 TBPK; Take 0.5 mg tab by mouth daily for 3 days then 0.5 mg tab twice a day for 4 days then 1 mg tablets twice a day  Dispense: 53 each; Refill: 0  3. Finger pain, right - diclofenac Sodium (VOLTAREN) 1 % GEL; Apply 2 g topically 2 (two) times daily as needed.  Dispense: 100 g; Refill: 0  4. Insomnia, unspecified type  - mirtazapine (REMERON) 30 MG tablet; Take 1 tablet (30 mg total) by mouth at bedtime.  Dispense: 90 tablet; Refill: 0  5. Screening for colon cancer We forgot to give fit test today.  We will give on subsequent visit.  6. Encounter for screening mammogram for malignant neoplasm of breast Given mammogram scholarship.  7. Need for shingles vaccine - Zoster Vaccine Adjuvanted [facility]) injection; Inject 0.5 mLs into the muscle once for 1 dose.  Dispense: 0.5 mL; Refill: 0     Patient was given the opportunity to ask questions.  Patient verbalized understanding of the  plan and was able to repeat key elements of the plan.   This documentation was completed using Radio producer.  Any transcriptional errors are unintentional.  Orders Placed This Encounter  Procedures   MM Digital Screening   Comprehensive metabolic panel   Lipid panel     Requested Prescriptions   Signed Prescriptions Disp Refills   amLODipine  (NORVASC) 10 MG tablet 90 tablet 1    Sig: Take 1 tablet (10 mg total) by mouth daily.   hydrochlorothiazide (HYDRODIURIL) 25 MG tablet 90 tablet 1    Sig: Take 1 tablet (25 mg total) by mouth daily.   mirtazapine (REMERON) 30 MG tablet 90 tablet 0    Sig: Take 1 tablet (30 mg total) by mouth at bedtime.   carvedilol (COREG) 6.25 MG tablet 180 tablet 1    Sig: Take 1 tablet (6.25 mg total) by mouth 2 (two) times daily with a meal.   Varenicline Tartrate, Starter, (CHANTIX STARTING MONTH PAK) 0.5 MG X 11 & 1 MG X 42 TBPK 53 each 0    Sig: Take 0.5 mg tab by mouth daily for 3 days then 0.5 mg tab twice a day for 4 days then 1 mg tablets twice a day   diclofenac Sodium (VOLTAREN) 1 % GEL 100 g 0    Sig: Apply 2 g topically 2 (two) times daily as needed.   Zoster Vaccine Adjuvanted [facility]) injection 0.5 mL 0    Sig: Inject 0.5 mLs into the muscle once for 1 dose.    Return in about 4 months (around 12/27/2022) for Appt with [facility] in 2 wks for BP check.  Karle Plumber, MD, FACP

## 2022-08-28 NOTE — Patient Instructions (Signed)
Stop metoprolol.  We have changed her to a blood pressure medicine called carvedilol instead.  Start Chantix starter pack to help decrease the cravings for cigarettes.  Once you finish the starter pack, please call in to get the continuation pack.  Please return to the lab sometime this week to have your blood test done.

## 2022-08-29 ENCOUNTER — Other Ambulatory Visit: Payer: Self-pay

## 2022-09-12 ENCOUNTER — Ambulatory Visit: Payer: Self-pay | Attending: Internal Medicine

## 2022-09-12 DIAGNOSIS — I10 Essential (primary) hypertension: Secondary | ICD-10-CM

## 2022-09-13 ENCOUNTER — Telehealth: Payer: Self-pay | Admitting: Internal Medicine

## 2022-09-13 LAB — COMPREHENSIVE METABOLIC PANEL
ALT: 25 IU/L (ref 0–32)
AST: 42 IU/L — ABNORMAL HIGH (ref 0–40)
Albumin/Globulin Ratio: 1.2 (ref 1.2–2.2)
Albumin: 4.2 g/dL (ref 3.8–4.9)
Alkaline Phosphatase: 106 IU/L (ref 44–121)
BUN/Creatinine Ratio: 13 (ref 9–23)
BUN: 11 mg/dL (ref 6–24)
Bilirubin Total: 0.3 mg/dL (ref 0.0–1.2)
CO2: 24 mmol/L (ref 20–29)
Calcium: 10.1 mg/dL (ref 8.7–10.2)
Chloride: 102 mmol/L (ref 96–106)
Creatinine, Ser: 0.84 mg/dL (ref 0.57–1.00)
Globulin, Total: 3.5 g/dL (ref 1.5–4.5)
Glucose: 86 mg/dL (ref 70–99)
Potassium: 4.2 mmol/L (ref 3.5–5.2)
Sodium: 143 mmol/L (ref 134–144)
Total Protein: 7.7 g/dL (ref 6.0–8.5)
eGFR: 83 mL/min/{1.73_m2} (ref >59)

## 2022-09-13 LAB — LIPID PANEL
Chol/HDL Ratio: 3.9 ratio (ref 0.0–4.4)
Cholesterol, Total: 185 mg/dL (ref 100–199)
HDL: 48 mg/dL (ref >39)
LDL Chol Calc (NIH): 111 mg/dL — ABNORMAL HIGH (ref 0–99)
Triglycerides: 145 mg/dL (ref 0–149)
VLDL Cholesterol Cal: 26 mg/dL (ref 5–40)

## 2022-09-13 MED ORDER — METOPROLOL SUCCINATE ER 50 MG PO TB24
50.0000 mg | ORAL_TABLET | Freq: Every day | ORAL | 1 refills | Status: DC
  Filled 2022-09-13: qty 90, 90d supply, fill #0
  Filled 2022-12-12: qty 90, 90d supply, fill #1

## 2022-09-13 NOTE — Telephone Encounter (Signed)
-----  Message from Becky Koch, [place] sent at 09/13/2022  9:39 AM EST ----- Called & spoke to the patient. Verified name & DOB. Informed of results. Patient expressed understanding.   Patient reports that the new BP medication is not working and giving high BP readings such as 140/110. She stopped taking the new medication and is now taking metoprolol. Patient is requesting for a refill on the metoprolol. Patient would like to know if she still needs to take the potassium supplement. Please advise.

## 2022-09-14 ENCOUNTER — Other Ambulatory Visit: Payer: Self-pay

## 2022-09-14 NOTE — Telephone Encounter (Signed)
Called & spoke to the patient. Verified name & DOB. Informed of medication change and dosage increase. Advised to go to the pahrmacy to get prescription and gave a friendly reminder to arrive at her next scheduled appointment with the clinical pharmacist on 09/27/2022. Patient expressed verbal understanding.

## 2022-09-20 ENCOUNTER — Other Ambulatory Visit: Payer: Self-pay

## 2022-09-27 ENCOUNTER — Other Ambulatory Visit: Payer: Self-pay

## 2022-09-27 ENCOUNTER — Ambulatory Visit: Payer: Self-pay | Attending: Family Medicine | Admitting: Pharmacist

## 2022-09-27 ENCOUNTER — Encounter: Payer: Self-pay | Admitting: Pharmacist

## 2022-09-27 VITALS — BP 129/88 | HR 71

## 2022-09-27 DIAGNOSIS — I10 Essential (primary) hypertension: Secondary | ICD-10-CM

## 2022-09-27 MED ORDER — VARENICLINE TARTRATE 1 MG PO TABS
1.0000 mg | ORAL_TABLET | Freq: Two times a day (BID) | ORAL | 2 refills | Status: DC
  Filled 2022-09-27: qty 60, 30d supply, fill #0
  Filled 2022-10-31: qty 60, 30d supply, fill #1
  Filled 2022-12-12: qty 60, 30d supply, fill #2

## 2022-09-27 NOTE — Progress Notes (Signed)
   S:    PCP: Dr. Wynetta Emery   Patient arrives in good spirits. Presents to the clinic for hypertension evaluation, counseling, and management. Patient was referred on 08/28/2022 by Dr. Wynetta Emery.  BP was above goal at that visit. Her metoprolol was changed to carvedilol, however, pt noted that her BP was higher on the carvedilol at home. Dr. Wynetta Emery has since changed her back to metoprolol and increased the dose.   Today, pt reports feeling well overall. Denies chest pain, dyspnea, HA or blurred vision. No LE edema.   Medication adherence reported.   Current BP Medications include:  Amlodipine 10 mg daily, HCTZ 25 mg daily, metoprolol succinate 50 mg daily   Dietary habits include: compliant with salt restriction. Has recently cut out red meat and pork.; denies drinking caffeine  Exercise habits include: exercises daily (walks, jump rope, jumping jacks) Family / Social history:  - FHx: no pertinent positives  - Tobacco: nicotine free! She is using Chantix and is not currently smoking or vaping. Has been nicotine free for 33 days. - Alcohol: occasional use   O:  Vitals:   09/27/22 1544  BP: 129/88  Pulse: 71   Home BP readings: reports 120s/80s for the past week.   Last 3 Office BP readings: BP Readings from Last 3 Encounters:  09/27/22 129/88  08/28/22 (!) 153/105  08/09/22 105/80   BMET    Component Value Date/Time   NA 143 09/12/2022 1000   K 4.2 09/12/2022 1000   CL 102 09/12/2022 1000   CO2 24 09/12/2022 1000   GLUCOSE 86 09/12/2022 1000   GLUCOSE 106 (H) 04/05/2022 1905   BUN 11 09/12/2022 1000   CREATININE 0.84 09/12/2022 1000   CREATININE 0.82 05/24/2017 1135   CALCIUM 10.1 09/12/2022 1000   GFRNONAA >60 04/05/2022 1905   GFRNONAA 85 05/24/2017 1135   GFRAA 110 06/15/2020 0930   GFRAA 98 05/24/2017 1135   Renal function: CrCl cannot be calculated (Unknown ideal weight.).  Clinical ASCVD: No  The 10-year ASCVD risk score (Arnett DK, et al., 2019) is: 4.8%    Values used to calculate the score:     Age: 51 years     Sex: Female     Is Non-Hispanic African American: Yes     Diabetic: No     Tobacco smoker: No     Systolic Blood Pressure: 865 mmHg     Is BP treated: Yes     HDL Cholesterol: 48 mg/dL     Total Cholesterol: 185 mg/dL   A/P: Hypertension longstanding currently above goal  but improved on current medications. BP Goal = < 130/80 mmHg. Medication adherence is appropriate. I offered to further adjust medications, however, we did discuss that her BP is improving with smoking cessation and dietary modification. Her BP at home looks good. She wishes to hold off on changes and will follow-up with her PCP.  -Continue current regimen. -Counseled on lifestyle modifications for blood pressure control including reduced dietary sodium, increased exercise, adequate sleep.  Results reviewed and written information provided.   Total time in face-to-face counseling 15 minutes.   F/U Clinic Visit 12/27/2022.   Benard Halsted, PharmD, [place], [place] [phone]

## 2022-09-29 ENCOUNTER — Other Ambulatory Visit: Payer: Self-pay | Admitting: Internal Medicine

## 2022-09-29 DIAGNOSIS — Z1211 Encounter for screening for malignant neoplasm of colon: Secondary | ICD-10-CM

## 2022-10-04 ENCOUNTER — Ambulatory Visit
Admission: RE | Admit: 2022-10-04 | Discharge: 2022-10-04 | Disposition: A | Discharge status: Home / Self Care | Payer: Self-pay | Source: Ambulatory Visit | Attending: Internal Medicine | Admitting: Internal Medicine

## 2022-10-04 DIAGNOSIS — Z1231 Encounter for screening mammogram for malignant neoplasm of breast: Secondary | ICD-10-CM

## 2022-10-10 ENCOUNTER — Other Ambulatory Visit: Payer: Self-pay

## 2022-10-10 ENCOUNTER — Other Ambulatory Visit (HOSPITAL_BASED_OUTPATIENT_CLINIC_OR_DEPARTMENT_OTHER): Payer: Self-pay

## 2022-10-10 ENCOUNTER — Encounter (HOSPITAL_BASED_OUTPATIENT_CLINIC_OR_DEPARTMENT_OTHER): Payer: Self-pay | Admitting: Emergency Medicine

## 2022-10-10 ENCOUNTER — Emergency Department (HOSPITAL_BASED_OUTPATIENT_CLINIC_OR_DEPARTMENT_OTHER)
Admission: EM | Admit: 2022-10-10 | Discharge: 2022-10-10 | Disposition: A | Discharge status: Home / Self Care | Payer: Self-pay | Attending: Emergency Medicine | Admitting: Emergency Medicine

## 2022-10-10 ENCOUNTER — Emergency Department (HOSPITAL_BASED_OUTPATIENT_CLINIC_OR_DEPARTMENT_OTHER): Payer: Self-pay | Admitting: Radiology

## 2022-10-10 DIAGNOSIS — Z79899 Other long term (current) drug therapy: Secondary | ICD-10-CM | POA: Insufficient documentation

## 2022-10-10 DIAGNOSIS — S92355A Nondisplaced fracture of fifth metatarsal bone, left foot, initial encounter for closed fracture: Secondary | ICD-10-CM | POA: Insufficient documentation

## 2022-10-10 DIAGNOSIS — X500XXA Overexertion from strenuous movement or load, initial encounter: Secondary | ICD-10-CM | POA: Insufficient documentation

## 2022-10-10 MED ORDER — HYDROCODONE-ACETAMINOPHEN 5-325 MG PO TABS
1.0000 | ORAL_TABLET | ORAL | 0 refills | Status: DC | PRN
  Filled 2022-10-10: qty 10, 2d supply, fill #0

## 2022-10-10 NOTE — ED Triage Notes (Signed)
Pt stepped down in clogs and twisted ankle to the side. Now outside of foot is hurting. Swelling.

## 2022-10-10 NOTE — ED Provider Notes (Signed)
[facility] Provider Note   CSN: [id number] Arrival date & time: 10/10/22  1203     History  Chief Complaint  Patient presents with   Foot Injury    Becky Koch is a 52 y.o. female.  HPI 52 year old female presents with left foot injury.  Yesterday afternoon she everted her left foot.  Has been having pain and now some swelling to the left mid foot.  It is painful to walk.  No ankle pain or swelling.  No other trauma.  No numbness.  Took some Advil a couple hours prior to arrival.  Home Medications Prior to Admission medications   Medication Sig Start Date End Date Taking? Authorizing Provider  HYDROcodone-acetaminophen (NORCO) 5-325 MG tablet Take 1 tablet by mouth every 4 (four) hours as needed for severe pain. 10/10/22  Yes Sherwood Gambler, MD  amLODipine (NORVASC) 10 MG tablet Take 1 tablet (10 mg total) by mouth daily. 08/28/22   Ladell Pier, MD  [facility] POTASSIUM GLUCONATE PO Take 1 tablet by mouth every other day.    [provider]  diclofenac Sodium (VOLTAREN) 1 % GEL Apply 2 g topically 2 (two) times daily as needed. 08/28/22   Ladell Pier, MD  hydrochlorothiazide (HYDRODIURIL) 25 MG tablet Take 1 tablet (25 mg total) by mouth daily. 08/28/22   Ladell Pier, MD  metoprolol succinate (TOPROL-XL) 50 MG 24 hr tablet Take 1 tablet (50 mg total) by mouth daily. Take with or immediately following a meal. Stop Carvedilol 09/13/22   Ladell Pier, MD  mirtazapine (REMERON) 30 MG tablet Take 1 tablet (30 mg total) by mouth at bedtime. 08/28/22   Ladell Pier, MD  omeprazole (PRILOSEC) 20 MG capsule Take 1 capsule by mouth daily. 02/23/22   Ladell Pier, MD  triamcinolone ointment (KENALOG) 0.5 % Apply 1 Application topically 2 (two) times daily. 08/09/22   Holley Bouche, MD  varenicline (CHANTIX) 1 MG tablet Take 1 tablet (1 mg total) by mouth 2 (two) times daily. 09/27/22   Ladell Pier, MD       Allergies    Patient has no known allergies.    Review of Systems   Review of Systems  Musculoskeletal:  Positive for arthralgias and joint swelling.  Neurological:  Negative for weakness and numbness.    Physical Exam Updated Vital Signs BP (!) 154/89 (BP Location: Left Arm)   Pulse 74   Temp 98.5 F (36.9 C) (Oral)   Resp 16   Ht 5' 5.5" (1.664 m)   Wt 69.9 kg   LMP 07/24/2019   SpO2 100%   BMI 25.24 kg/m  Physical Exam Vitals and nursing note reviewed.  Constitutional:      Appearance: She is well-developed.  HENT:     Head: Normocephalic and atraumatic.  Cardiovascular:     Rate and Rhythm: Normal rate and regular rhythm.     Pulses:          Dorsalis pedis pulses are 2+ on the left side.  Pulmonary:     Effort: Pulmonary effort is normal.  Abdominal:     General: There is no distension.  Musculoskeletal:     Left lower leg: No swelling or tenderness.     Left ankle: No swelling. No tenderness. Normal range of motion.     Left Achilles Tendon: No tenderness or defects.     Left foot: Swelling and tenderness present.  Legs:     Comments: Grossly normal sensation in left foot  Skin:    General: Skin is warm and dry.  Neurological:     Mental Status: She is alert.     ED Results / Procedures / Treatments   Labs (all labs ordered are listed, but only abnormal results are displayed) Labs Reviewed - No data to display  EKG None  Radiology DG Foot Complete Left  Result Date: 10/10/2022 CLINICAL DATA:  Fall, left foot pain EXAM: LEFT FOOT - COMPLETE 3+ VIEW COMPARISON:  None Available. FINDINGS: There is a nondisplaced fracture the base of the fifth metatarsal approximately 0.9 cm from the proximal tubercle. No other acute fracture in the foot. Small os peroneum. No significant arthropathy. Small Insertional Achilles enthesophyte. IMPRESSION: Nondisplaced fracture at the base of the fifth metatarsal. Electronically Signed   By: Maurine Simmering M.D.   On:  10/10/2022 12:50    Procedures Procedures    Medications Ordered in ED Medications - No data to display  ED Course/ Medical Decision Making/ A&P                             Medical Decision Making Amount and/or Complexity of Data Reviewed Radiology: ordered.   Foot x-rays viewed/interpreted by myself and shows a proximal fifth metatarsal fracture without displacement.  Suspect this was from an avulsion from the eversion maneuver.  Will put in boot and have her follow-up with orthopedics.  Will give pain control.  She declines anything currently.  No other significant injuries or ankle/Achilles tenderness or deformity.  Will give boot and crutches.        Final Clinical Impression(s) / ED Diagnoses Final diagnoses:  Nondisplaced fracture of fifth metatarsal bone, left foot, initial encounter for closed fracture    Rx / DC Orders ED Discharge Orders          Ordered    HYDROcodone-acetaminophen (NORCO) 5-325 MG tablet  Every 4 hours PRN        10/10/22 1306              Sherwood Gambler, MD 10/10/22 1311

## 2022-10-10 NOTE — Discharge Instructions (Signed)
Your x-ray shows a fracture or break at the base of your foot on the fifth digit.  You can walk as tolerated with the boot.  You can use the crutches as needed as well.  Take ibuprofen and/or Tylenol for pain and if these are not working or you have breakthrough pain then take the hydrocodone.  Do not combine this with alcohol or other drugs.  Follow-up with the orthopedist.

## 2022-10-16 ENCOUNTER — Ambulatory Visit (HOSPITAL_COMMUNITY)
Admission: EM | Admit: 2022-10-16 | Discharge: 2022-10-16 | Disposition: A | Discharge status: Home / Self Care | Payer: Self-pay | Attending: Internal Medicine | Admitting: Internal Medicine

## 2022-10-16 ENCOUNTER — Other Ambulatory Visit: Payer: Self-pay

## 2022-10-16 ENCOUNTER — Encounter (HOSPITAL_COMMUNITY): Payer: Self-pay

## 2022-10-16 DIAGNOSIS — R300 Dysuria: Secondary | ICD-10-CM | POA: Insufficient documentation

## 2022-10-16 LAB — POCT URINALYSIS DIPSTICK, ED / UC
Bilirubin Urine: NEGATIVE
Glucose, UA: NEGATIVE mg/dL
Ketones, ur: NEGATIVE mg/dL
Nitrite: NEGATIVE
Protein, ur: NEGATIVE mg/dL
Specific Gravity, Urine: 1.025 (ref 1.005–1.030)
Urobilinogen, UA: 0.2 mg/dL (ref 0.0–1.0)
pH: 6 (ref 5.0–8.0)

## 2022-10-16 MED ORDER — FLUCONAZOLE 150 MG PO TABS
150.0000 mg | ORAL_TABLET | Freq: Once | ORAL | 0 refills | Status: AC
  Filled 2022-10-16: qty 2, 3d supply, fill #0

## 2022-10-16 MED ORDER — NITROFURANTOIN MONOHYD MACRO 100 MG PO CAPS
100.0000 mg | ORAL_CAPSULE | Freq: Two times a day (BID) | ORAL | 0 refills | Status: DC
  Filled 2022-10-16: qty 10, 5d supply, fill #0

## 2022-10-16 MED ORDER — PHENAZOPYRIDINE HCL 200 MG PO TABS
200.0000 mg | ORAL_TABLET | Freq: Three times a day (TID) | ORAL | 0 refills | Status: DC
  Filled 2022-10-16: qty 6, 2d supply, fill #0

## 2022-10-16 NOTE — ED Provider Notes (Addendum)
[facility]    CSN: [id number] Arrival date & time: 10/16/22  1312      History   Chief Complaint Chief Complaint  Patient presents with   Urinary Tract Infection    HPI Becky Koch is a 52 y.o. female who presents with dysuria and frequency x 4 days. Has not had a UTI in about 5-6 years. Denies fever or flank pain.   2- Would like a refill of Norco for fractured L foot. Was supposed to FU with ortho, but they would not see her since it was a work injury. Pt has been calling the adjustor and leaving messages and there has not been any calls back from them. She was seen in the ER at [facility] on 2/21 and was given # 10 norco's then.     Past Medical History:  Diagnosis Date   Allergy    Anemia    Blood transfusion without reported diagnosis    Fibroid, uterine    GERD (gastroesophageal reflux disease)    Hypertension     Patient Active Problem List   Diagnosis Date Noted   Skin lesion 08/09/2022   Subcutaneous cyst 07/19/2021   Hypertriglyceridemia 06/14/2021   Insomnia 06/14/2021   Subcutaneous mass of abdominal wall 06/14/2021   Onychomycosis 06/14/2021   Gastroesophageal reflux disease without esophagitis 06/14/2021   Palpitations 06/15/2020   SBO (small bowel obstruction) ([place]) 08/02/2019   DUB (dysfunctional uterine bleeding) 07/28/2019   Post-operative state 07/28/2019   Gastroesophageal reflux disease with esophagitis 12/05/2018   Vapes nicotine containing substance 06/23/2018   Actinic keratoses 05/02/2017   Essential hypertension 07/14/2012    Past Surgical History:  Procedure Laterality Date   LAPAROSCOPIC LYSIS OF ADHESIONS N/A 08/03/2019   Procedure: Laparoscopic Lysis Of Adhesions;  Surgeon: [name], MD;  Location: [facility];  Service: General;  Laterality: N/A;   LAPAROSCOPY N/A 08/03/2019   Procedure: LAPAROSCOPY DIAGNOSTIC;  Surgeon: [name], MD;  Location: [facility];  Service: General;  Laterality: N/A;   TUBAL  LIGATION     VAGINAL HYSTERECTOMY Bilateral 07/28/2019   Procedure: HYSTERECTOMY VAGINAL WITH BILATERAL SALPINGECTOMY;  Surgeon: Emily Filbert, MD;  Location: [facility];  Service: Gynecology;  Laterality: Bilateral;    OB History     Gravida  8   Para  4   Term  4   Preterm      AB  4   Living  4      SAB  1   IAB  3   Ectopic      Multiple      Live Births               Home Medications    Prior to Admission medications   Medication Sig Start Date End Date Taking? Authorizing Provider  fluconazole (DIFLUCAN) 150 MG tablet One one now, then repeat in 3 days 10/16/22  Yes Rodriguez-Southworth, Sunday Spillers, PA-C  nitrofurantoin, macrocrystal-monohydrate, (MACROBID) 100 MG capsule Take 1 capsule (100 mg total) by mouth 2 (two) times daily. 10/16/22  Yes Rodriguez-Southworth, Sunday Spillers, PA-C  phenazopyridine (PYRIDIUM) 200 MG tablet Take 1 tablet (200 mg total) by mouth 3 (three) times daily. 10/16/22  Yes Rodriguez-Southworth, Sunday Spillers, PA-C  amLODipine (NORVASC) 10 MG tablet Take 1 tablet (10 mg total) by mouth daily. 08/28/22   Ladell Pier, MD  [facility] POTASSIUM GLUCONATE PO Take 1 tablet by mouth every other day.    [provider]  diclofenac Sodium (VOLTAREN) 1 %  GEL Apply 2 g topically 2 (two) times daily as needed. 08/28/22   Ladell Pier, MD  hydrochlorothiazide (HYDRODIURIL) 25 MG tablet Take 1 tablet (25 mg total) by mouth daily. 08/28/22   Ladell Pier, MD  HYDROcodone-acetaminophen (NORCO) 5-325 MG tablet Take 1 tablet by mouth every 4 (four) hours as needed for severe pain. 10/10/22   Sherwood Gambler, MD  metoprolol succinate (TOPROL-XL) 50 MG 24 hr tablet Take 1 tablet (50 mg total) by mouth daily. Take with or immediately following a meal. Stop Carvedilol 09/13/22   Ladell Pier, MD  mirtazapine (REMERON) 30 MG tablet Take 1 tablet (30 mg total) by mouth at bedtime. 08/28/22   Ladell Pier, MD  omeprazole (PRILOSEC) 20 MG  capsule Take 1 capsule by mouth daily. 02/23/22   Ladell Pier, MD  triamcinolone ointment (KENALOG) 0.5 % Apply 1 Application topically 2 (two) times daily. 08/09/22   Holley Bouche, MD  varenicline (CHANTIX) 1 MG tablet Take 1 tablet (1 mg total) by mouth 2 (two) times daily. 09/27/22   Ladell Pier, MD    Family History Family History  Problem Relation Age of Onset   Cancer Paternal Grandfather    Colon cancer Neg Hx    Colon polyps Neg Hx    Esophageal cancer Neg Hx    Rectal cancer Neg Hx    Stomach cancer Neg Hx    Breast cancer Neg Hx     Social History Social History   Tobacco Use   Smoking status: Former    Types: E-cigarettes, Cigarettes    Quit date: 08/28/2022    Years since quitting: 0.1   Smokeless tobacco: Never  Vaping Use   Vaping Use: Never used  Substance Use Topics   Alcohol use: Yes    Comment: social alcohol   Drug use: No     Allergies   Patient has no known allergies.   Review of Systems Review of Systems  Constitutional:  Negative for fever.  Genitourinary:  Positive for dysuria, frequency and urgency. Negative for flank pain and vaginal discharge.  Musculoskeletal:  Positive for arthralgias.     Physical Exam Triage Vital Signs ED Triage Vitals  Enc Vitals Group     BP 10/16/22 1426 (!) 165/102     Pulse Rate 10/16/22 1426 87     Resp 10/16/22 1426 18     Temp 10/16/22 1426 98.6 F (37 C)     Temp Source 10/16/22 1426 Oral     SpO2 10/16/22 1426 97 %     Weight --      Height --      Head Circumference --      Peak Flow --      Pain Score 10/16/22 1427 7     Pain Loc --      Pain Edu? --      Excl. in [place]? --    No data found.  Updated Vital Signs BP (!) 165/102 (BP Location: Left Arm)   Pulse 87   Temp 98.6 F (37 C) (Oral)   Resp 18   LMP 07/24/2019   SpO2 97%   Visual Acuity Right Eye Distance:   Left Eye Distance:   Bilateral Distance:    Right Eye Near:   Left Eye Near:    Bilateral Near:      Physical Exam  Physical Exam Vitals and nursing note reviewed.  Constitutional:      General: She is not in  acute distress.    Appearance: She is not toxic-appearing.  HENT:     Head: Normocephalic.     Right Ear: External ear normal.     Left Ear: External ear normal.  Eyes:     General: No scleral icterus.    Conjunctiva/sclera: Conjunctivae normal.  Pulmonary:     Effort: Pulmonary effort is normal.  Abdominal:     General: Bowel sounds are normal.     Palpations: Abdomen is soft. There is no mass.     Tenderness: There is no guarding or rebound.     Comments: - CVA tenderness   Musculoskeletal:   she is wearing a walker boot on the L      Skin:    General: Skin is warm and dry.     Findings: No rash.  Neurological:     Mental Status: She is alert and oriented to person, place, and time.    Psychiatric:        Mood and Affect: Mood normal.        Behavior: Behavior normal.        Thought Content: Thought content normal.        Judgment: Judgment normal.   UC Treatments / Results  Labs (all labs ordered are listed, but only abnormal results are displayed) Labs Reviewed  POCT URINALYSIS DIPSTICK, ED / UC - Abnormal; Notable for the following components:      Result Value   Hgb urine dipstick TRACE (*)    Leukocytes,Ua TRACE (*)    All other components within normal limits  URINE CULTURE    EKG   Radiology No results found.  Procedures Procedures (including critical care time)  Medications Ordered in UC Medications - No data to display  Initial Impression / Assessment and Plan / UC Course  I have reviewed the triage vital signs and the nursing notes.  Pertinent labs  results that were available during my care of the patient were reviewed by me and considered in my medical decision making (see chart for details).   Dysuria Fracture of L foot  Urine culture was ordered I placed her on Macrobid as noted I explained to her we dont do refill on  pain meds here and should cal her PCP or FU with [facility]. Pt agrees. See instructions.  Final Clinical Impressions(s) / UC Diagnoses   Final diagnoses:  Dysuria     Discharge Instructions      We will call you if the culture confirms your infection and if we need to make any changes to your medication.      ED Prescriptions     Medication Sig Dispense Auth. Provider   nitrofurantoin, macrocrystal-monohydrate, (MACROBID) 100 MG capsule Take 1 capsule (100 mg total) by mouth 2 (two) times daily. 10 capsule Rodriguez-Southworth, Sunday Spillers, PA-C   phenazopyridine (PYRIDIUM) 200 MG tablet Take 1 tablet (200 mg total) by mouth 3 (three) times daily. 6 tablet Rodriguez-Southworth, Sunday Spillers, PA-C   fluconazole (DIFLUCAN) 150 MG tablet One one now, then repeat in 3 days 2 tablet Rodriguez-Southworth, Sunday Spillers, PA-C      PDMP not reviewed this encounter.   Shelby Mattocks, PA-C 10/16/22 1500    Rodriguez-Southworth, [place], [place] 10/16/22 [phone]

## 2022-10-16 NOTE — ED Triage Notes (Signed)
Pt c/o burning on urination with urgency and frequency x4 days. States taking AZO with no relief.  Pt requesting pain meds for her fx foot. States was unable to see her ortho yesterday d/y worker comp issues.

## 2022-10-16 NOTE — Discharge Instructions (Signed)
We will call you if the culture confirms your infection and if we need to make any changes to your medication.

## 2022-10-18 LAB — URINE CULTURE
Culture: >=100000 — AB
Special Requests: NORMAL

## 2022-10-31 ENCOUNTER — Other Ambulatory Visit: Payer: Self-pay | Admitting: Internal Medicine

## 2022-10-31 DIAGNOSIS — K219 Gastro-esophageal reflux disease without esophagitis: Secondary | ICD-10-CM

## 2022-11-01 ENCOUNTER — Other Ambulatory Visit: Payer: Self-pay

## 2022-11-01 MED ORDER — OMEPRAZOLE 20 MG PO CPDR
20.0000 mg | DELAYED_RELEASE_CAPSULE | Freq: Every day | ORAL | 3 refills | Status: DC
  Filled 2022-11-01 – 2022-12-12 (×2): qty 30, 30d supply, fill #0
  Filled 2023-08-22: qty 90, 90d supply, fill #1
  Filled 2023-08-22: qty 30, 30d supply, fill #1

## 2022-11-02 ENCOUNTER — Other Ambulatory Visit: Payer: Self-pay

## 2022-11-07 ENCOUNTER — Other Ambulatory Visit: Payer: Self-pay

## 2022-12-12 ENCOUNTER — Other Ambulatory Visit: Payer: Self-pay

## 2022-12-12 ENCOUNTER — Other Ambulatory Visit: Payer: Self-pay | Admitting: Internal Medicine

## 2022-12-12 DIAGNOSIS — G4700 Insomnia, unspecified: Secondary | ICD-10-CM

## 2022-12-12 MED ORDER — MIRTAZAPINE 30 MG PO TABS
30.0000 mg | ORAL_TABLET | Freq: Every day | ORAL | 0 refills | Status: DC
  Filled 2022-12-12: qty 30, 30d supply, fill #0

## 2022-12-13 ENCOUNTER — Other Ambulatory Visit: Payer: Self-pay

## 2022-12-14 ENCOUNTER — Other Ambulatory Visit: Payer: Self-pay

## 2022-12-27 ENCOUNTER — Encounter: Payer: Self-pay | Admitting: Internal Medicine

## 2022-12-27 ENCOUNTER — Other Ambulatory Visit: Payer: Self-pay

## 2022-12-27 ENCOUNTER — Ambulatory Visit: Payer: No Typology Code available for payment source | Attending: Internal Medicine | Admitting: Internal Medicine

## 2022-12-27 VITALS — BP 110/80 | HR 87 | Temp 98.9°F | Ht 65.0 in | Wt 164.2 lb

## 2022-12-27 DIAGNOSIS — E7800 Pure hypercholesterolemia, unspecified: Secondary | ICD-10-CM | POA: Diagnosis not present

## 2022-12-27 DIAGNOSIS — Z87891 Personal history of nicotine dependence: Secondary | ICD-10-CM | POA: Diagnosis not present

## 2022-12-27 DIAGNOSIS — I10 Essential (primary) hypertension: Secondary | ICD-10-CM | POA: Diagnosis not present

## 2022-12-27 DIAGNOSIS — Z1211 Encounter for screening for malignant neoplasm of colon: Secondary | ICD-10-CM

## 2022-12-27 DIAGNOSIS — Z23 Encounter for immunization: Secondary | ICD-10-CM | POA: Diagnosis not present

## 2022-12-27 MED ORDER — AMLODIPINE BESYLATE 5 MG PO TABS
5.0000 mg | ORAL_TABLET | Freq: Every day | ORAL | 1 refills | Status: DC
  Filled 2022-12-27 – 2023-08-22 (×3): qty 90, 90d supply, fill #0

## 2022-12-27 MED ORDER — HYDROCHLOROTHIAZIDE 25 MG PO TABS
25.0000 mg | ORAL_TABLET | Freq: Every day | ORAL | 1 refills | Status: DC
  Filled 2022-12-27 – 2023-04-26 (×2): qty 90, 90d supply, fill #0
  Filled 2023-08-22: qty 90, 90d supply, fill #1

## 2022-12-27 MED ORDER — METOPROLOL SUCCINATE ER 50 MG PO TB24
50.0000 mg | ORAL_TABLET | Freq: Every day | ORAL | 1 refills | Status: DC
  Filled 2022-12-27 – 2023-04-17 (×2): qty 90, 90d supply, fill #0
  Filled 2023-08-22: qty 90, 90d supply, fill #1

## 2022-12-27 NOTE — Progress Notes (Signed)
Patient ID: Becky Koch, female    DOB: November 22, 1970  MRN: [id number]  CC: Hypertension   Subjective: Becky Koch is a 52 y.o. female who presents for chronic ds management Her concerns today include:  Patient with history of HTN, elev TG, GERD, tob dep,    HTN: Patient should be on amlodipine 10 mg daily, HCTZ 25 mg daily, metoprolol succinate 50 mg daily.  However she has decreased amlodipine to half a tablet daily.  She states that the 10 mg tablets causes her to feel some pinching in her chest. Checks BP daily.  Range 130-140s/80 Limits salt in foods.   LDL 111 on labs.  She has changed her eating habits.  She has cut back on red meats. Walking 3x/wk. her ASCVD at the time was 17%.  She would like to have cholesterol rechecked to see if it has improved.  Tob dep:  quit with Chantix since 09/2022  HM: FIT tests ordered but patient states she never got the kit.  We will give her 1 today.  Patient Active Problem List   Diagnosis Date Noted   Skin lesion 08/09/2022   Subcutaneous cyst 07/19/2021   Hypertriglyceridemia 06/14/2021   Insomnia 06/14/2021   Subcutaneous mass of abdominal wall 06/14/2021   Onychomycosis 06/14/2021   Gastroesophageal reflux disease without esophagitis 06/14/2021   Palpitations 06/15/2020   SBO (small bowel obstruction) (HCC) 08/02/2019   DUB (dysfunctional uterine bleeding) 07/28/2019   Post-operative state 07/28/2019   Gastroesophageal reflux disease with esophagitis 12/05/2018   Vapes nicotine containing substance 06/23/2018   Actinic keratoses 05/02/2017   Essential hypertension 07/14/2012     Current Outpatient Medications on File Prior to Visit  Medication Sig Dispense Refill   amLODipine (NORVASC) 10 MG tablet Take 1 tablet (10 mg total) by mouth daily. 90 tablet 1   [facility] POTASSIUM GLUCONATE PO Take 1 tablet by mouth every other day.     diclofenac Sodium (VOLTAREN) 1 % GEL Apply 2 g topically 2 (two) times daily as needed. 100 g 0    hydrochlorothiazide (HYDRODIURIL) 25 MG tablet Take 1 tablet (25 mg total) by mouth daily. 90 tablet 1   HYDROcodone-acetaminophen (NORCO) 5-325 MG tablet Take 1 tablet by mouth every 4 (four) hours as needed for severe pain. 10 tablet 0   metoprolol succinate (TOPROL-XL) 50 MG 24 hr tablet Take 1 tablet (50 mg total) by mouth daily. Take with or immediately following a meal. Stop Carvedilol 90 tablet 1   mirtazapine (REMERON) 30 MG tablet Take 1 tablet (30 mg total) by mouth at bedtime. 30 tablet 0   nitrofurantoin, macrocrystal-monohydrate, (MACROBID) 100 MG capsule Take 1 capsule (100 mg total) by mouth 2 (two) times daily. 10 capsule 0   omeprazole (PRILOSEC) 20 MG capsule Take 1 capsule by mouth daily. 30 capsule 3   phenazopyridine (PYRIDIUM) 200 MG tablet Take 1 tablet (200 mg total) by mouth 3 (three) times daily. 6 tablet 0   triamcinolone ointment (KENALOG) 0.5 % Apply 1 Application topically 2 (two) times daily. 30 g 2   varenicline (CHANTIX) 1 MG tablet Take 1 tablet (1 mg total) by mouth 2 (two) times daily. 60 tablet 2   No current facility-administered medications on file prior to visit.    No Known Allergies  Social History   Socioeconomic History   Marital status: Legally Separated    Spouse name: Not on file   Number of children: Not on file   Years of education: Not  on file   Highest education level: Not on file  Occupational History   Not on file  Tobacco Use   Smoking status: Former    Types: E-cigarettes, Cigarettes    Quit date: 08/28/2022    Years since quitting: 0.3   Smokeless tobacco: Never  Vaping Use   Vaping Use: Never used  Substance and Sexual Activity   Alcohol use: Yes    Comment: social alcohol   Drug use: No   Sexual activity: Yes    Birth control/protection: Surgical  Other Topics Concern   Not on file  Social History Narrative   Not on file   Social Determinants of Health   Financial Resource Strain: Not on file  Food Insecurity: Not  on file  Transportation Needs: Not on file  Physical Activity: Not on file  Stress: Not on file  Social Connections: Not on file  Intimate Partner Violence: Not on file    Family History  Problem Relation Age of Onset   Cancer Paternal Grandfather    Colon cancer Neg Hx    Colon polyps Neg Hx    Esophageal cancer Neg Hx    Rectal cancer Neg Hx    Stomach cancer Neg Hx    Breast cancer Neg Hx     Past Surgical History:  Procedure Laterality Date   LAPAROSCOPIC LYSIS OF ADHESIONS N/A 08/03/2019   Procedure: Laparoscopic Lysis Of Adhesions;  Surgeon: Emelia Loron, MD;  Location: [facility] OR;  Service: General;  Laterality: N/A;   LAPAROSCOPY N/A 08/03/2019   Procedure: LAPAROSCOPY DIAGNOSTIC;  Surgeon: Emelia Loron, MD;  Location: [facility] OR;  Service: General;  Laterality: N/A;   TUBAL LIGATION     VAGINAL HYSTERECTOMY Bilateral 07/28/2019   Procedure: HYSTERECTOMY VAGINAL WITH BILATERAL SALPINGECTOMY;  Surgeon: Allie Bossier, MD;  Location: [facility];  Service: Gynecology;  Laterality: Bilateral;    ROS: Review of Systems Negative except as stated above  PHYSICAL EXAM: BP 110/80   Pulse 87   Temp 98.9 F (37.2 C)   Ht 5\' 5"  (1.651 m)   Wt 164 lb 3.2 oz (74.5 kg)   LMP 07/24/2019   SpO2 98%   BMI 27.32 kg/m   Physical Exam  General appearance - alert, well appearing, and in no distress Mental status - normal mood, behavior, speech, dress, motor activity, and thought processes Chest - clear to auscultation, no wheezes, rales or rhonchi, symmetric air entry Heart - normal rate, regular rhythm, normal S1, S2, no murmurs, rubs, clicks or gallops Extremities - peripheral pulses normal, no pedal edema, no clubbing or cyanosis      Latest Ref Rng & Units 09/12/2022   10:00 AM 04/05/2022    7:05 PM 04/05/2022    4:46 PM  CMP  Glucose 70 - 99 mg/dL 86  130  865   BUN 6 - 24 mg/dL 11  10  13    Creatinine 0.57 - 1.00 mg/dL 7.84  6.96  2.95   Sodium 134 -  144 mmol/L 143  140  134   Potassium 3.5 - 5.2 mmol/L 4.2  2.9  7.9   Chloride 96 - 106 mmol/L 102  108  104   CO2 20 - 29 mmol/L 24  22    Calcium 8.7 - 10.2 mg/dL 28.4  9.3    Total Protein 6.0 - 8.5 g/dL 7.7  7.5    Total Bilirubin 0.0 - 1.2 mg/dL 0.3  0.7    Alkaline Phos 44 -  121 IU/L 106  64    AST 0 - 40 IU/L 42  36    ALT 0 - 32 IU/L 25  24     Lipid Panel     Component Value Date/Time   CHOL 185 09/12/2022 1000   TRIG 145 09/12/2022 1000   HDL 48 09/12/2022 1000   CHOLHDL 3.9 09/12/2022 1000   CHOLHDL 4.3 06/21/2016 0952   VLDL 22 06/21/2016 0952   LDLCALC 111 (H) 09/12/2022 1000    CBC    Component Value Date/Time   WBC 20.3 (H) 04/05/2022 1618   RBC 4.99 04/05/2022 1618   HGB 15.6 (H) 04/05/2022 1646   HGB 14.6 02/18/2019 1559   HCT 46.0 04/05/2022 1646   HCT 44.9 02/18/2019 1559   PLT 266 04/05/2022 1618   PLT 363 02/18/2019 1559   MCV 87.8 04/05/2022 1618   MCV 91 02/18/2019 1559   MCH 28.7 04/05/2022 1618   MCHC 32.6 04/05/2022 1618   RDW 13.4 04/05/2022 1618   RDW 12.5 02/18/2019 1559   LYMPHSABS 0.8 04/05/2022 1618   LYMPHSABS 2.2 02/18/2019 1559   MONOABS 1.0 04/05/2022 1618   EOSABS 0.0 04/05/2022 1618   EOSABS 0.1 02/18/2019 1559   BASOSABS 0.0 04/05/2022 1618   BASOSABS 0.0 02/18/2019 1559    ASSESSMENT AND PLAN:  1. Essential hypertension At goal.  Continue Toprol XL 50 mg daily, HCTZ 25 mg daily and Norvasc 10 mg 1/2 tab daily  2. Former smoker Commended her on quitting.  Encouraged her to remain tobacco free.  3. Screening for colon cancer I had the CMA give her a fit test today.  She will take it home.  Advised that the same day she uses it she should bring it back to the lab.  4. Need for shingles vaccine Second Shingrix vaccine given today  5. Hypercholesterolemia She will return to the lab to have recheck lipid profile - lipid - future    Patient was given the opportunity to ask questions.  Patient verbalized  understanding of the plan and was able to repeat key elements of the plan.   This documentation was completed using Paediatric nurse.  Any transcriptional errors are unintentional.  Orders Placed This Encounter  Procedures   Varicella-zoster vaccine IM   Lipid panel     Requested Prescriptions    No prescriptions requested or ordered in this encounter    Return in about 4 months (around 04/29/2023).  Jonah Blue, MD, FACP

## 2022-12-28 ENCOUNTER — Ambulatory Visit: Payer: No Typology Code available for payment source | Attending: Family Medicine

## 2022-12-28 ENCOUNTER — Other Ambulatory Visit: Payer: Self-pay

## 2022-12-28 DIAGNOSIS — Z1211 Encounter for screening for malignant neoplasm of colon: Secondary | ICD-10-CM

## 2022-12-28 DIAGNOSIS — E7800 Pure hypercholesterolemia, unspecified: Secondary | ICD-10-CM

## 2022-12-28 DIAGNOSIS — E876 Hypokalemia: Secondary | ICD-10-CM

## 2022-12-29 LAB — BMP8+EGFR
BUN/Creatinine Ratio: 20 (ref 9–23)
BUN: 16 mg/dL (ref 6–24)
CO2: 21 mmol/L (ref 20–29)
Calcium: 10.3 mg/dL — ABNORMAL HIGH (ref 8.7–10.2)
Chloride: 104 mmol/L (ref 96–106)
Creatinine, Ser: 0.81 mg/dL (ref 0.57–1.00)
Glucose: 125 mg/dL — ABNORMAL HIGH (ref 70–99)
Potassium: 4.3 mmol/L (ref 3.5–5.2)
Sodium: 142 mmol/L (ref 134–144)
eGFR: 87 mL/min/{1.73_m2} (ref >59)

## 2022-12-29 LAB — LIPID PANEL
Chol/HDL Ratio: 3.9 ratio (ref 0.0–4.4)
Cholesterol, Total: 169 mg/dL (ref 100–199)
HDL: 43 mg/dL (ref >39)
LDL Chol Calc (NIH): 98 mg/dL (ref 0–99)
Triglycerides: 157 mg/dL — ABNORMAL HIGH (ref 0–149)
VLDL Cholesterol Cal: 28 mg/dL (ref 5–40)

## 2022-12-30 LAB — FECAL OCCULT BLOOD, IMMUNOCHEMICAL: Fecal Occult Bld: NEGATIVE

## 2023-01-03 ENCOUNTER — Other Ambulatory Visit: Payer: Self-pay

## 2023-02-01 ENCOUNTER — Other Ambulatory Visit: Payer: Self-pay | Admitting: Internal Medicine

## 2023-02-01 ENCOUNTER — Other Ambulatory Visit: Payer: Self-pay

## 2023-02-01 DIAGNOSIS — G4700 Insomnia, unspecified: Secondary | ICD-10-CM

## 2023-02-01 MED ORDER — MIRTAZAPINE 30 MG PO TABS
30.0000 mg | ORAL_TABLET | Freq: Every day | ORAL | 0 refills | Status: DC
  Filled 2023-02-01: qty 30, 30d supply, fill #0

## 2023-02-01 MED ORDER — VARENICLINE TARTRATE 1 MG PO TABS
1.0000 mg | ORAL_TABLET | Freq: Two times a day (BID) | ORAL | 2 refills | Status: DC
  Filled 2023-02-01: qty 60, 30d supply, fill #0
  Filled 2023-02-01 (×2): qty 30, 15d supply, fill #0
  Filled 2023-04-26: qty 60, 30d supply, fill #1
  Filled 2023-08-22: qty 60, 30d supply, fill #2

## 2023-02-01 NOTE — Telephone Encounter (Signed)
Requested Prescriptions  Pending Prescriptions Disp Refills   varenicline (CHANTIX) 1 MG tablet 60 tablet 2    Sig: Take 1 tablet (1 mg total) by mouth 2 (two) times daily.     Psychiatry:  Drug Dependence Therapy - varenicline Failed - 02/01/2023  2:53 PM      Failed - Manual Review: Do not refill starter pack. 1 mg tabs may be extended up to one year if the patient has quit smoking but still feels at risk for relapse.      Passed - Cr in normal range and within 180 days    Creat  Date Value Ref Range Status  05/24/2017 0.82 0.50 - 1.10 mg/dL Final   Creatinine, Ser  Date Value Ref Range Status  12/28/2022 0.81 0.57 - 1.00 mg/dL Final         Passed - Completed PHQ-2 or PHQ-9 in the last 360 days      Passed - Valid encounter within last 6 months    Recent Outpatient Visits           1 month ago Essential hypertension   [place] [facility] Marcine Matar, MD   4 months ago Essential hypertension   [place] [facility] [place], Cornelius Moras, RPH-CPP   5 months ago Essential hypertension   [place] [facility] Marcine Matar, MD   8 months ago Actinic keratoses   [place] [facility] Grayce Sessions, NP   1 year ago Essential hypertension   [place] [facility] Marcine Matar, MD       Future Appointments             In 3 months Marcine Matar, MD [place] [facility]             mirtazapine (REMERON) 30 MG tablet 30 tablet 0    Sig: Take 1 tablet (30 mg total) by mouth at bedtime.     Psychiatry: Antidepressants - mirtazapine Passed - 02/01/2023  2:53 PM      Passed - Valid encounter within last 6 months    Recent Outpatient Visits           1 month ago Essential hypertension   [place] [facility] Marcine Matar, MD   4 months ago Essential hypertension   [facility] [place], Cornelius Moras, RPH-CPP   5 months ago Essential hypertension   [place] [facility] Marcine Matar, MD   8 months ago Actinic keratoses   [place] [facility] Grayce Sessions, NP   1 year ago Essential hypertension   [place] [facility] Marcine Matar, MD       Future Appointments             In 3 months Laural Benes, Binnie Rail, MD [facility]

## 2023-02-04 ENCOUNTER — Other Ambulatory Visit: Payer: Self-pay

## 2023-02-05 ENCOUNTER — Other Ambulatory Visit: Payer: Self-pay

## 2023-02-26 ENCOUNTER — Telehealth: Payer: Self-pay | Admitting: Internal Medicine

## 2023-02-26 NOTE — Telephone Encounter (Signed)
Copied from CRM [id number]. Topic: General - Other >> Feb 26, 2023  1:35 PM Ja-Kwan M wrote: Reason for CRM: Pt stated she has a spot on her stomach and she needs documentation of what the spot is before she can have the procedure done. Pt requests call back  Cb# [phone]

## 2023-02-28 NOTE — Telephone Encounter (Signed)
Called & spoke to the patient. Verified name & DOB. Patient clarified that the CT report of mass on abdomen is needed from 2020. Informed patient that report has been printed and placed at the front desk for pick-up. Patient expressed verbal understanding.

## 2023-03-15 ENCOUNTER — Other Ambulatory Visit: Payer: Self-pay

## 2023-03-15 ENCOUNTER — Other Ambulatory Visit: Payer: Self-pay | Admitting: Internal Medicine

## 2023-03-15 DIAGNOSIS — G4700 Insomnia, unspecified: Secondary | ICD-10-CM

## 2023-03-15 MED ORDER — MIRTAZAPINE 30 MG PO TABS
30.0000 mg | ORAL_TABLET | Freq: Every day | ORAL | 0 refills | Status: DC
  Filled 2023-03-15: qty 90, 90d supply, fill #0

## 2023-03-15 NOTE — Telephone Encounter (Signed)
Requested Prescriptions  Pending Prescriptions Disp Refills   mirtazapine (REMERON) 30 MG tablet 90 tablet 0    Sig: Take 1 tablet (30 mg total) by mouth at bedtime.     Psychiatry: Antidepressants - mirtazapine Passed - 03/15/2023 11:31 AM      Passed - Valid encounter within last 6 months    Recent Outpatient Visits           2 months ago Essential hypertension   [place] [facility] Marcine Matar, MD   5 months ago Essential hypertension   [facility] [place], Cornelius Moras, RPH-CPP   6 months ago Essential hypertension   [place] [facility] Marcine Matar, MD   9 months ago Actinic keratoses   [place] [facility] Grayce Sessions, NP   1 year ago Essential hypertension   [place] [facility] Marcine Matar, MD       Future Appointments             In 1 month Laural Benes, Binnie Rail, MD [facility]

## 2023-04-18 ENCOUNTER — Other Ambulatory Visit: Payer: Self-pay

## 2023-04-19 ENCOUNTER — Other Ambulatory Visit: Payer: Self-pay

## 2023-04-26 ENCOUNTER — Other Ambulatory Visit: Payer: Self-pay | Admitting: Internal Medicine

## 2023-04-26 ENCOUNTER — Other Ambulatory Visit: Payer: Self-pay

## 2023-04-26 DIAGNOSIS — G4700 Insomnia, unspecified: Secondary | ICD-10-CM

## 2023-04-26 MED ORDER — MIRTAZAPINE 30 MG PO TABS
30.0000 mg | ORAL_TABLET | Freq: Every day | ORAL | 0 refills | Status: DC
  Filled 2023-04-26 – 2023-08-22 (×2): qty 90, 90d supply, fill #0

## 2023-04-26 NOTE — Telephone Encounter (Signed)
Requested Prescriptions  Pending Prescriptions Disp Refills   mirtazapine (REMERON) 30 MG tablet 90 tablet 0    Sig: Take 1 tablet (30 mg total) by mouth at bedtime.     Psychiatry: Antidepressants - mirtazapine Passed - 04/26/2023  9:25 AM      Passed - Valid encounter within last 6 months    Recent Outpatient Visits           4 months ago Essential hypertension   [place] Marcine Matar, MD   7 months ago Essential hypertension   [facility] [place], Cornelius Moras, RPH-CPP   8 months ago Essential hypertension   [place] [facility] Marcine Matar, MD   10 months ago Actinic keratoses   [place] [facility] Grayce Sessions, NP   1 year ago Essential hypertension   [place] [facility] Marcine Matar, MD       Future Appointments             In 1 week Marcine Matar, MD [facility]

## 2023-05-01 ENCOUNTER — Other Ambulatory Visit: Payer: Self-pay

## 2023-05-03 ENCOUNTER — Ambulatory Visit: Payer: No Typology Code available for payment source | Admitting: Internal Medicine

## 2023-05-30 ENCOUNTER — Ambulatory Visit: Payer: No Typology Code available for payment source | Admitting: Physician Assistant

## 2023-05-31 ENCOUNTER — Telehealth: Payer: Self-pay

## 2023-05-31 NOTE — Telephone Encounter (Signed)
Copied from CRM [id number]. Topic: Appointment Scheduling - Scheduling Inquiry for Clinic >> May 30, 2023  3:10 PM Becky Koch E wrote: Reason for CRM: Pt wants to come in just for lab work, wants to know if she can have an appt to check her cholesterol.

## 2023-06-01 NOTE — Telephone Encounter (Signed)
We plan to recheck cholesterol on her next visit.

## 2023-06-05 NOTE — Telephone Encounter (Signed)
Called & spoke to the patient. Verified name & DOB. Informed that cholesterol levels will be rechecked during next visit. Patient insists that cholesterol levls be checked now. Please advise.

## 2023-07-30 ENCOUNTER — Other Ambulatory Visit (HOSPITAL_COMMUNITY): Payer: Self-pay

## 2023-07-30 MED ORDER — CHLORHEXIDINE GLUCONATE 0.12 % MT SOLN
15.0000 mL | Freq: Two times a day (BID) | OROMUCOSAL | 0 refills | Status: AC
  Filled 2023-07-30 – 2023-12-03 (×2): qty 473, 16d supply, fill #0

## 2023-07-30 MED ORDER — IBUPROFEN 800 MG PO TABS
800.0000 mg | ORAL_TABLET | Freq: Three times a day (TID) | ORAL | 0 refills | Status: AC | PRN
  Filled 2023-07-30: qty 20, 10d supply, fill #0

## 2023-07-30 MED ORDER — AMOXICILLIN 500 MG PO CAPS
500.0000 mg | ORAL_CAPSULE | Freq: Three times a day (TID) | ORAL | 0 refills | Status: DC
  Filled 2023-07-30 – 2023-12-03 (×2): qty 21, 7d supply, fill #0

## 2023-08-08 ENCOUNTER — Other Ambulatory Visit (HOSPITAL_COMMUNITY): Payer: Self-pay

## 2023-08-16 ENCOUNTER — Ambulatory Visit: Payer: No Typology Code available for payment source | Admitting: Internal Medicine

## 2023-08-22 ENCOUNTER — Other Ambulatory Visit: Payer: Self-pay

## 2023-08-23 ENCOUNTER — Other Ambulatory Visit: Payer: Self-pay

## 2023-08-29 ENCOUNTER — Other Ambulatory Visit: Payer: Self-pay

## 2023-09-18 ENCOUNTER — Encounter: Payer: Self-pay | Admitting: Physician Assistant

## 2023-09-18 ENCOUNTER — Ambulatory Visit: Payer: No Typology Code available for payment source | Attending: Physician Assistant | Admitting: Physician Assistant

## 2023-09-18 ENCOUNTER — Other Ambulatory Visit: Payer: Self-pay

## 2023-09-18 VITALS — BP 132/98 | HR 80 | Wt 164.0 lb

## 2023-09-18 DIAGNOSIS — I10 Essential (primary) hypertension: Secondary | ICD-10-CM

## 2023-09-18 DIAGNOSIS — K219 Gastro-esophageal reflux disease without esophagitis: Secondary | ICD-10-CM | POA: Diagnosis not present

## 2023-09-18 DIAGNOSIS — E7800 Pure hypercholesterolemia, unspecified: Secondary | ICD-10-CM | POA: Diagnosis not present

## 2023-09-18 DIAGNOSIS — N951 Menopausal and female climacteric states: Secondary | ICD-10-CM

## 2023-09-18 MED ORDER — OMEPRAZOLE 20 MG PO CPDR
20.0000 mg | DELAYED_RELEASE_CAPSULE | Freq: Every day | ORAL | 3 refills | Status: AC
  Filled 2023-09-18 – 2024-02-12 (×2): qty 30, 30d supply, fill #0

## 2023-09-18 MED ORDER — AMLODIPINE BESYLATE 5 MG PO TABS
7.5000 mg | ORAL_TABLET | Freq: Every day | ORAL | 1 refills | Status: DC
  Filled 2023-09-18 – 2023-12-03 (×2): qty 135, 90d supply, fill #0

## 2023-09-18 MED ORDER — SERTRALINE HCL 50 MG PO TABS
50.0000 mg | ORAL_TABLET | Freq: Every day | ORAL | 3 refills | Status: DC
  Filled 2023-09-18: qty 30, 30d supply, fill #0
  Filled 2023-10-29: qty 30, 30d supply, fill #1
  Filled 2023-12-03: qty 30, 30d supply, fill #2
  Filled 2024-01-22: qty 30, 30d supply, fill #3

## 2023-09-18 MED ORDER — HYDROCHLOROTHIAZIDE 25 MG PO TABS
25.0000 mg | ORAL_TABLET | Freq: Every day | ORAL | 1 refills | Status: DC
  Filled 2023-09-18 – 2023-12-03 (×2): qty 90, 90d supply, fill #0

## 2023-09-18 MED ORDER — METOPROLOL SUCCINATE ER 50 MG PO TB24
50.0000 mg | ORAL_TABLET | Freq: Every day | ORAL | 1 refills | Status: DC
  Filled 2023-09-18 – 2023-12-03 (×2): qty 90, 90d supply, fill #0
  Filled 2024-05-15 (×2): qty 90, 90d supply, fill #1

## 2023-09-18 NOTE — Progress Notes (Signed)
Patient ID: Becky Koch, female   DOB: 09/16/1970, 53 y.o.   MRN: [id number]   Katheen Koch, is a 53 y.o. female  VHQ:[id number]  UXL:[id number]  DOB - 1971/02/14  No chief complaint on file.      Subjective:   Becky Koch is a 53 y.o. female here today for RF on meds and some irritability and anxiety she feels is associated with menopause.  She was on estradiol for a while but stopped it a couple of years ago.  Hot flashes not as severe as they used to be.  She has noticed a lot more irritability and anxiety in menopause than she had prior.  She is interested in taking something that might help with that.  Denies SI/HI  Compliant with BP meds.  BP OOO ~130/high 90s-100.  She could not tolerate 10mg  amlodipine and is currently on 5mg  in addition to metoprolol and hydrochlorothiazide.  Denies HA/CP/SOB/dizziness.  She is fasting this afternoon  No problems updated.  ALLERGIES: No Known Allergies  PAST MEDICAL HISTORY: Past Medical History:  Diagnosis Date   Allergy    Anemia    Blood transfusion without reported diagnosis    Fibroid, uterine    GERD (gastroesophageal reflux disease)    Hypertension     MEDICATIONS AT HOME: Prior to Admission medications   Medication Sig Start Date End Date Taking? Authorizing Provider  amoxicillin (AMOXIL) 500 MG capsule Take 1 capsule (500 mg total) by mouth every 8 (eight) hours until finished. 07/30/23  Yes   chlorhexidine (PERIDEX) 0.12 % solution Swish for 1 minute then spit out twice daily for 10 days as directed 07/30/23  Yes   diclofenac Sodium (VOLTAREN) 1 % GEL Apply 2 g topically 2 (two) times daily as needed. 08/28/22  Yes Marcine Matar, MD  ibuprofen (ADVIL) 800 MG tablet Take 1 tablet (800 mg total) by mouth every 8 (eight) hours as needed for pain. 07/30/23  Yes   mirtazapine (REMERON) 30 MG tablet Take 1 tablet (30 mg total) by mouth at bedtime. 04/26/23  Yes Marcine Matar, MD  phenazopyridine (PYRIDIUM) 200 MG tablet  Take 1 tablet (200 mg total) by mouth 3 (three) times daily. 10/16/22  Yes Rodriguez-Southworth, Nettie Elm, PA-C  sertraline (ZOLOFT) 50 MG tablet Take 1 tablet (50 mg total) by mouth daily. 09/18/23  Yes Anders Simmonds, PA-C  triamcinolone ointment (KENALOG) 0.5 % Apply 1 Application topically 2 (two) times daily. 08/09/22  Yes Sowell, Apolinar Junes, MD  varenicline (CHANTIX) 1 MG tablet Take 1 tablet (1 mg total) by mouth 2 (two) times daily. 02/01/23  Yes Marcine Matar, MD  amLODipine (NORVASC) 5 MG tablet Take 1.5 tablets (7.5 mg total) by mouth daily. 09/18/23   Anders Simmonds, PA-C  [facility] POTASSIUM GLUCONATE PO Take 1 tablet by mouth every other day. Patient not taking: Reported on 09/18/2023    [provider]  hydrochlorothiazide (HYDRODIURIL) 25 MG tablet Take 1 tablet (25 mg total) by mouth daily. 09/18/23   Anders Simmonds, PA-C  metoprolol succinate (TOPROL-XL) 50 MG 24 hr tablet Take 1 tablet (50 mg total) by mouth daily. Take with or immediately following a meal. Stop Carvedilol 09/18/23   Anders Simmonds, PA-C  omeprazole (PRILOSEC) 20 MG capsule Take 1 capsule by mouth daily. 09/18/23   Lopez Dentinger, Marzella Schlein, PA-C    ROS: Neg HEENT Neg resp Neg cardiac Neg GI Neg GU Neg MS Neg neuro  Objective:   Vitals:   09/18/23 1558  09/18/23 1702  BP: (!) 150/106 (!) 132/98  Pulse: 80   SpO2: 100%   Weight: 164 lb (74.4 kg)    Exam General appearance : Awake, alert, not in any distress. Speech Clear. Not toxic looking HEENT: Atraumatic and Normocephalic Neck: Supple, no JVD. No cervical lymphadenopathy.  Chest: Good air entry bilaterally, CTAB.  No rales/rhonchi/wheezing CVS: S1 S2 regular, no murmurs.  Extremities: B/L Lower Ext shows no edema, both legs are warm to touch Neurology: Awake alert, and oriented X 3, CN II-XII intact, Non focal Skin: No Rash  Data Review Lab Results  Component Value Date   HGBA1C 5.2 04/21/2020   HGBA1C 5.1 06/21/2016    Assessment &  Plan   1. Menopausal symptoms (Primary) Can try for irritability and anxiety related to menopause - sertraline (ZOLOFT) 50 MG tablet; Take 1 tablet (50 mg total) by mouth daily.  Dispense: 30 tablet; Refill: 3  2. Essential hypertension Not at goal.  She could not tolerate 10mg  amlodipine but will try 7.5 and keep metoprolo and hydrochlorothiazide as-is - Lipid Panel - Comprehensive metabolic panel - amLODipine (NORVASC) 5 MG tablet; Take 1.5 tablets (7.5 mg total) by mouth daily.  Dispense: 135 tablet; Refill: 1 - metoprolol succinate (TOPROL-XL) 50 MG 24 hr tablet; Take 1 tablet (50 mg total) by mouth daily. Take with or immediately following a meal. Stop Carvedilol  Dispense: 90 tablet; Refill: 1 - hydrochlorothiazide (HYDRODIURIL) 25 MG tablet; Take 1 tablet (25 mg total) by mouth daily.  Dispense: 90 tablet; Refill: 1  3. Hypercholesterolemia - Lipid Panel  4. Gastroesophageal reflux disease without esophagitis stable - omeprazole (PRILOSEC) 20 MG capsule; Take 1 capsule by mouth daily.  Dispense: 30 capsule; Refill: 3    Return in about 2 months (around 11/16/2023) for PCP for chronic conditions-Johnson.  The patient was given clear instructions to go to ER or return to medical center if symptoms don't improve, worsen or new problems develop. The patient verbalized understanding. The patient was told to call to get lab results if they haven't heard anything in the next week.      Georgian Co, PA-C [facility] and [facility] [place], [place] [phone]   09/18/2023, 5:07 PM

## 2023-09-19 ENCOUNTER — Other Ambulatory Visit: Payer: Self-pay

## 2023-09-19 ENCOUNTER — Encounter: Payer: Self-pay | Admitting: Physician Assistant

## 2023-09-19 ENCOUNTER — Other Ambulatory Visit: Payer: Self-pay | Admitting: Physician Assistant

## 2023-09-19 ENCOUNTER — Telehealth: Payer: Self-pay

## 2023-09-19 DIAGNOSIS — E7800 Pure hypercholesterolemia, unspecified: Secondary | ICD-10-CM

## 2023-09-19 LAB — COMPREHENSIVE METABOLIC PANEL
ALT: 27 [IU]/L (ref 0–32)
AST: 43 [IU]/L — ABNORMAL HIGH (ref 0–40)
Albumin: 4.6 g/dL (ref 3.8–4.9)
Alkaline Phosphatase: 99 [IU]/L (ref 44–121)
BUN/Creatinine Ratio: 11 (ref 9–23)
BUN: 8 mg/dL (ref 6–24)
Bilirubin Total: 0.3 mg/dL (ref 0.0–1.2)
CO2: 25 mmol/L (ref 20–29)
Calcium: 10.7 mg/dL — ABNORMAL HIGH (ref 8.7–10.2)
Chloride: 100 mmol/L (ref 96–106)
Creatinine, Ser: 0.74 mg/dL (ref 0.57–1.00)
Globulin, Total: 3.4 g/dL (ref 1.5–4.5)
Glucose: 85 mg/dL (ref 70–99)
Potassium: 4.2 mmol/L (ref 3.5–5.2)
Sodium: 141 mmol/L (ref 134–144)
Total Protein: 8 g/dL (ref 6.0–8.5)
eGFR: 96 mL/min/{1.73_m2} (ref >59)

## 2023-09-19 LAB — LIPID PANEL
Chol/HDL Ratio: 4.3 {ratio} (ref 0.0–4.4)
Cholesterol, Total: 190 mg/dL (ref 100–199)
HDL: 44 mg/dL (ref >39)
LDL Chol Calc (NIH): 115 mg/dL — ABNORMAL HIGH (ref 0–99)
Triglycerides: 175 mg/dL — ABNORMAL HIGH (ref 0–149)
VLDL Cholesterol Cal: 31 mg/dL (ref 5–40)

## 2023-09-19 MED ORDER — ATORVASTATIN CALCIUM 10 MG PO TABS
10.0000 mg | ORAL_TABLET | Freq: Every day | ORAL | 3 refills | Status: AC
Start: 2023-09-19 — End: ?
  Filled 2023-09-19: qty 90, 90d supply, fill #0
  Filled 2023-12-03: qty 90, 90d supply, fill #1
  Filled 2024-02-12: qty 90, 90d supply, fill #2
  Filled 2024-07-30: qty 90, 90d supply, fill #3

## 2023-09-19 NOTE — Telephone Encounter (Signed)
Pt was called and is aware of results, DOB was confirmed.  ?

## 2023-09-19 NOTE — Telephone Encounter (Signed)
-----   Message from Georgian Co sent at 09/19/2023  3:08 PM EST ----- Please call patient.  Total cholesterol is normal but "bad cholesterol" is elevated.  Please eat less red meats and fatty/fried foods as well as foods high in sugar to help reduce your cholesterol.  Kidney and liver function normal.  Calcium is a little elevated but not of concern at this level.  We will continue to keep an eye on this.  Thanks, Georgian Co, PA-C

## 2023-10-30 ENCOUNTER — Other Ambulatory Visit: Payer: Self-pay

## 2023-12-03 ENCOUNTER — Other Ambulatory Visit: Payer: Self-pay

## 2023-12-03 ENCOUNTER — Other Ambulatory Visit: Payer: Self-pay | Admitting: Internal Medicine

## 2023-12-03 DIAGNOSIS — G4700 Insomnia, unspecified: Secondary | ICD-10-CM

## 2023-12-04 ENCOUNTER — Other Ambulatory Visit: Payer: Self-pay

## 2023-12-04 MED ORDER — ZOSTER VAC RECOMB ADJUVANTED 50 MCG/0.5ML IM SUSR
0.5000 mL | Freq: Once | INTRAMUSCULAR | 0 refills | Status: AC
  Filled 2023-12-04: qty 1, 1d supply, fill #0

## 2023-12-04 MED ORDER — MIRTAZAPINE 30 MG PO TABS
30.0000 mg | ORAL_TABLET | Freq: Every day | ORAL | 0 refills | Status: DC
  Filled 2023-12-04: qty 30, 30d supply, fill #0

## 2023-12-04 MED ORDER — VARENICLINE TARTRATE 1 MG PO TABS
1.0000 mg | ORAL_TABLET | Freq: Two times a day (BID) | ORAL | 2 refills | Status: AC
  Filled 2023-12-04: qty 60, 30d supply, fill #0
  Filled 2024-01-22: qty 60, 30d supply, fill #1

## 2023-12-30 ENCOUNTER — Other Ambulatory Visit: Payer: Self-pay

## 2024-01-22 ENCOUNTER — Other Ambulatory Visit: Payer: Self-pay

## 2024-01-22 ENCOUNTER — Other Ambulatory Visit: Payer: Self-pay | Admitting: Internal Medicine

## 2024-01-22 DIAGNOSIS — G4700 Insomnia, unspecified: Secondary | ICD-10-CM

## 2024-01-22 NOTE — Telephone Encounter (Signed)
 Instructed by PCP with last refill to make a f/u appt.

## 2024-01-23 ENCOUNTER — Other Ambulatory Visit: Payer: Self-pay

## 2024-01-30 ENCOUNTER — Other Ambulatory Visit: Payer: Self-pay

## 2024-02-12 ENCOUNTER — Encounter: Payer: Self-pay | Admitting: Pharmacist

## 2024-02-12 ENCOUNTER — Other Ambulatory Visit: Payer: Self-pay

## 2024-02-12 ENCOUNTER — Other Ambulatory Visit: Payer: Self-pay | Admitting: Internal Medicine

## 2024-02-12 DIAGNOSIS — G4700 Insomnia, unspecified: Secondary | ICD-10-CM

## 2024-02-19 ENCOUNTER — Other Ambulatory Visit: Payer: Self-pay

## 2024-03-03 ENCOUNTER — Telehealth: Payer: Self-pay | Admitting: Internal Medicine

## 2024-03-03 NOTE — Telephone Encounter (Signed)
 Copied from CRM [id number]. Topic: Clinical - Medical Advice >> Mar 03, 2024 11:06 AM Marissa P wrote:  Reason for CRM: Patient called wanting to see if the doctor can give a referral t go back to the dermatologist because skin is itching again and spreading

## 2024-03-04 NOTE — Telephone Encounter (Signed)
 The patient has been scheduled for the next available appointment 03/31/2024 at 9:50 am with Dr. Vicci. Patient has confirmed and acknowledged of the appointment.

## 2024-03-04 NOTE — Telephone Encounter (Signed)
 Give visit.  Can be virtual.

## 2024-03-04 NOTE — Telephone Encounter (Signed)
 Noted! Thank you

## 2024-03-16 ENCOUNTER — Other Ambulatory Visit: Payer: Self-pay | Admitting: Physician Assistant

## 2024-03-16 DIAGNOSIS — N951 Menopausal and female climacteric states: Secondary | ICD-10-CM

## 2024-03-17 ENCOUNTER — Other Ambulatory Visit: Payer: Self-pay

## 2024-03-17 MED ORDER — SERTRALINE HCL 50 MG PO TABS
50.0000 mg | ORAL_TABLET | Freq: Every day | ORAL | 0 refills | Status: DC
  Filled 2024-03-17: qty 30, 30d supply, fill #0

## 2024-03-18 ENCOUNTER — Other Ambulatory Visit: Payer: Self-pay

## 2024-03-31 ENCOUNTER — Encounter: Payer: Self-pay | Admitting: Internal Medicine

## 2024-03-31 ENCOUNTER — Other Ambulatory Visit: Payer: Self-pay

## 2024-03-31 ENCOUNTER — Ambulatory Visit: Payer: Self-pay | Attending: Internal Medicine | Admitting: Internal Medicine

## 2024-03-31 VITALS — BP 144/86 | HR 67 | Temp 98.4°F | Ht 65.0 in | Wt 155.0 lb

## 2024-03-31 DIAGNOSIS — G4700 Insomnia, unspecified: Secondary | ICD-10-CM

## 2024-03-31 DIAGNOSIS — Z1231 Encounter for screening mammogram for malignant neoplasm of breast: Secondary | ICD-10-CM

## 2024-03-31 DIAGNOSIS — R232 Flushing: Secondary | ICD-10-CM

## 2024-03-31 DIAGNOSIS — L309 Dermatitis, unspecified: Secondary | ICD-10-CM

## 2024-03-31 DIAGNOSIS — Z860101 Personal history of adenomatous and serrated colon polyps: Secondary | ICD-10-CM

## 2024-03-31 DIAGNOSIS — Z1211 Encounter for screening for malignant neoplasm of colon: Secondary | ICD-10-CM

## 2024-03-31 DIAGNOSIS — I10 Essential (primary) hypertension: Secondary | ICD-10-CM

## 2024-03-31 MED ORDER — MIRTAZAPINE 30 MG PO TABS
30.0000 mg | ORAL_TABLET | Freq: Every day | ORAL | 1 refills | Status: DC
  Filled 2024-03-31: qty 30, 30d supply, fill #0
  Filled 2024-05-18: qty 30, 30d supply, fill #1

## 2024-03-31 MED ORDER — TRIAMCINOLONE ACETONIDE 0.5 % EX OINT
1.0000 | TOPICAL_OINTMENT | Freq: Two times a day (BID) | CUTANEOUS | 0 refills | Status: AC
  Filled 2024-03-31: qty 30, 15d supply, fill #0

## 2024-03-31 MED ORDER — VALSARTAN-HYDROCHLOROTHIAZIDE 80-12.5 MG PO TABS
1.0000 | ORAL_TABLET | Freq: Every day | ORAL | 3 refills | Status: AC
  Filled 2024-03-31: qty 90, 90d supply, fill #0
  Filled 2024-08-14: qty 90, 90d supply, fill #1

## 2024-03-31 MED ORDER — AMLODIPINE BESYLATE 5 MG PO TABS
7.5000 mg | ORAL_TABLET | Freq: Every day | ORAL | 1 refills | Status: AC
  Filled 2024-03-31: qty 135, 90d supply, fill #0
  Filled 2024-08-14: qty 135, 90d supply, fill #1

## 2024-03-31 NOTE — Progress Notes (Signed)
 Patient ID: Becky Koch, female    DOB: 1970/12/29  MRN: [id number]  CC: Skin concern (Skin concern on R arm, mid back back, abdomen, itchy & spreading X7 yrs/Requesting routine blood work/Yes to pneumonia & Tdap vax)   Subjective: Becky Koch is a 53 y.o. female who presents for chronic ds management. Her concerns today include:  Patient with history of HTN, elev TG, GERD, former tob dep,    Discussed the use of AI scribe software for clinical note transcription with the patient, who gave verbal consent to proceed.  History of Present Illness Becky Koch is a 53 year old female who presents with a recurrent itchy rash.  The rash initially appeared seven years ago as a small area over her right deltoid/shoulder.  She reportedly saw dermatologist for this about 3 years ago and biopsy was done but she does not remember the results.  She states that the dermatologist sprayed something on it that alleviated the itching but did not completely take away the rash. Approximately three weeks ago, the rash began to itch again and has since increased in size she also has a similar area on her upper back and the upper abdomen.  The areas feel hard.  They are not painful.  The rash is intensely itchy.  HTN: She is currently on amlodipine  7.5 mg daily, metoprolol  50 mg daily, and hydrochlorothiazide  25 mg daily for hypertension. Her blood pressure readings are in the 140s/80s to 90s range.  She tries to limit salt in the foods.  She complains of hot flashes that have been getting worse.  She has a history of a hysterectomy in 2020 and was previously on estradiol  for hot flashes, which she discontinued stating that she had heard bad things about it.  She is doing well on Zoloft , started in January for anxiety.  She is also on Remeron  for insomnia and has been out of it for 2 months.  Requests refills.  She is on 30 mg.  She is not interested in trying a decreased dose.  HM: She has no family  history of colon cancer and had a colonoscopy in 2020 where one precancerous polyp was removed.     Patient Active Problem List   Diagnosis Date Noted   Skin lesion 08/09/2022   Subcutaneous cyst 07/19/2021   Hypertriglyceridemia 06/14/2021   Insomnia 06/14/2021   Subcutaneous mass of abdominal wall 06/14/2021   Onychomycosis 06/14/2021   Gastroesophageal reflux disease without esophagitis 06/14/2021   Palpitations 06/15/2020   SBO (small bowel obstruction) (HCC) 08/02/2019   DUB (dysfunctional uterine bleeding) 07/28/2019   Post-operative state 07/28/2019   Gastroesophageal reflux disease with esophagitis 12/05/2018   Vapes nicotine  containing substance 06/23/2018   Actinic keratoses 05/02/2017   Essential hypertension 07/14/2012     Current Outpatient Medications on File Prior to Visit  Medication Sig Dispense Refill   atorvastatin  (LIPITOR) 10 MG tablet Take 1 tablet (10 mg total) by mouth daily. 90 tablet 3   diclofenac  Sodium (VOLTAREN ) 1 % GEL Apply 2 g topically 2 (two) times daily as needed. 100 g 0   ibuprofen  (ADVIL ) 800 MG tablet Take 1 tablet (800 mg total) by mouth every 8 (eight) hours as needed for pain. 20 tablet 0   metoprolol  succinate (TOPROL -XL) 50 MG 24 hr tablet Take 1 tablet (50 mg total) by mouth daily. Take with or immediately following a meal. Stop Carvedilol  90 tablet 1   omeprazole  (PRILOSEC) 20 MG capsule Take 1  capsule by mouth daily. 30 capsule 3   sertraline  (ZOLOFT ) 50 MG tablet Take 1 tablet (50 mg total) by mouth daily.Please keep upcoming appointment for additional refills. 30 tablet 0   chlorhexidine  (PERIDEX ) 0.12 % solution Swish for 1 minute then spit out twice daily for 10 days as directed (Patient not taking: Reported on 03/31/2024) 473 mL 0   [facility] POTASSIUM GLUCONATE PO Take 1 tablet by mouth every other day. (Patient not taking: Reported on 03/31/2024)     varenicline  (CHANTIX ) 1 MG tablet Take 1 tablet (1 mg total) by mouth 2 (two) times  daily. (Patient not taking: Reported on 03/31/2024) 60 tablet 2   No current facility-administered medications on file prior to visit.    No Known Allergies  Social History   Socioeconomic History   Marital status: Legally Separated    Spouse name: Not on file   Number of children: Not on file   Years of education: Not on file   Highest education level: Not on file  Occupational History   Not on file  Tobacco Use   Smoking status: Former    Current packs/day: 0.00    Types: E-cigarettes, Cigarettes    Quit date: 08/28/2022    Years since quitting: 1.5   Smokeless tobacco: Never  Vaping Use   Vaping status: Never Used  Substance and Sexual Activity   Alcohol use: Yes    Comment: social alcohol   Drug use: No   Sexual activity: Yes    Birth control/protection: Surgical  Other Topics Concern   Not on file  Social History Narrative   Not on file   Social Drivers of Health   Financial Resource Strain: Not on file  Food Insecurity: Not on file  Transportation Needs: Not on file  Physical Activity: Not on file  Stress: Not on file  Social Connections: Not on file  Intimate Partner Violence: Not on file    Family History  Problem Relation Age of Onset   Cancer Paternal Grandfather    Colon cancer Neg Hx    Colon polyps Neg Hx    Esophageal cancer Neg Hx    Rectal cancer Neg Hx    Stomach cancer Neg Hx    Breast cancer Neg Hx     Past Surgical History:  Procedure Laterality Date   LAPAROSCOPIC LYSIS OF ADHESIONS N/A 08/03/2019   Procedure: Laparoscopic Lysis Of Adhesions;  Surgeon: Ebbie Cough, MD;  Location: [facility] OR;  Service: General;  Laterality: N/A;   LAPAROSCOPY N/A 08/03/2019   Procedure: LAPAROSCOPY DIAGNOSTIC;  Surgeon: Ebbie Cough, MD;  Location: [facility] OR;  Service: General;  Laterality: N/A;   TUBAL LIGATION     VAGINAL HYSTERECTOMY Bilateral 07/28/2019   Procedure: HYSTERECTOMY VAGINAL WITH BILATERAL SALPINGECTOMY;  Surgeon: Starla Harland BROCKS, MD;   Location: [facility];  Service: Gynecology;  Laterality: Bilateral;    ROS: Review of Systems Negative except as stated above  PHYSICAL EXAM: BP (!) 144/86   Pulse 67   Temp 98.4 F (36.9 C) (Oral)   Ht 5' 5 (1.651 m)   Wt 155 lb (70.3 kg)   LMP 07/24/2019   SpO2 99%   BMI 25.79 kg/m   Physical Exam  General appearance - alert, well appearing, and in no distress Mental status - normal mood, behavior, speech, dress, motor activity, and thought processes Chest - clear to auscultation, no wheezes, rales or rhonchi, symmetric air entry Heart - normal rate, regular rhythm, normal S1, S2,  no murmurs, rubs, clicks or gallops Abdomen -normal bowel sounds and soft. Skin -patient has hyperpigmented firm indurated area over the right deltoid, midline about mid thoracic spine area and a small 1 to the left of that.  She also has a 9 x 5 cm hypopigmented firm indurated area of the upper abdomen more to the left side.  See pictures below          Latest Ref Rng & Units 09/18/2023    5:12 PM 12/28/2022   10:19 AM 09/12/2022   10:00 AM  CMP  Glucose 70 - 99 mg/dL 85  874  86   BUN 6 - 24 mg/dL 8  16  11    Creatinine 0.57 - 1.00 mg/dL 9.25  9.18  9.15   Sodium 134 - 144 mmol/L 141  142  143   Potassium 3.5 - 5.2 mmol/L 4.2  4.3  4.2   Chloride 96 - 106 mmol/L 100  104  102   CO2 20 - 29 mmol/L 25  21  24    Calcium  8.7 - 10.2 mg/dL 89.2  89.6  89.8   Total Protein 6.0 - 8.5 g/dL 8.0   7.7   Total Bilirubin 0.0 - 1.2 mg/dL 0.3   0.3   Alkaline Phos 44 - 121 IU/L 99   106   AST 0 - 40 IU/L 43   42   ALT 0 - 32 IU/L 27   25    Lipid Panel     Component Value Date/Time   CHOL 190 09/18/2023 1712   TRIG 175 (H) 09/18/2023 1712   HDL 44 09/18/2023 1712   CHOLHDL 4.3 09/18/2023 1712   CHOLHDL 4.3 06/21/2016 0952   VLDL 22 06/21/2016 0952   LDLCALC 115 (H) 09/18/2023 1712    CBC    Component Value Date/Time   WBC 20.3 (H) 04/05/2022 1618   RBC 4.99 04/05/2022  1618   HGB 15.6 (H) 04/05/2022 1646   HGB 14.6 02/18/2019 1559   HCT 46.0 04/05/2022 1646   HCT 44.9 02/18/2019 1559   PLT 266 04/05/2022 1618   PLT 363 02/18/2019 1559   MCV 87.8 04/05/2022 1618   MCV 91 02/18/2019 1559   MCH 28.7 04/05/2022 1618   MCHC 32.6 04/05/2022 1618   RDW 13.4 04/05/2022 1618   RDW 12.5 02/18/2019 1559   LYMPHSABS 0.8 04/05/2022 1618   LYMPHSABS 2.2 02/18/2019 1559   MONOABS 1.0 04/05/2022 1618   EOSABS 0.0 04/05/2022 1618   EOSABS 0.1 02/18/2019 1559   BASOSABS 0.0 04/05/2022 1618   BASOSABS 0.0 02/18/2019 1559    ASSESSMENT AND PLAN: 1. Dermatitis Patient presenting with intense itching over patches of firm indurated hyperpigmented skin over the right deltoid, the upper back and upper abdomen.  Reports that this was biopsied about 3 years ago by dermatology but she does not recall the diagnosis and I do not see a note from the dermatologist in the chart.  Will resubmit referral to dermatology.  Advised to apply for Medicaid or the [facility] discount as soon as possible.  In the meantime we will give a trial of triamcinolone  cream - Ambulatory referral to Dermatology - triamcinolone  ointment (KENALOG ) 0.5 %; Apply 1 Application topically 2 (two) times daily.  Dispense: 30 g; Refill: 0  2. Essential hypertension (Primary) Not at goal.  Continue amlodipine  5 mg daily.  Stop HCTZ 25 mg.  Changed to valsartan /HCTZ 80/12.5 mg daily - amLODipine  (NORVASC ) 5 MG tablet; Take 1.5 tablets (7.5 mg  total) by mouth daily.  Dispense: 135 tablet; Refill: 1 - valsartan -hydrochlorothiazide  (DIOVAN -HCT) 80-12.5 MG tablet; Take 1 tablet by mouth daily.Stop hydrochlorothiazide  25 mg.   Dispense: 90 tablet; Refill: 3  3. Insomnia, unspecified type - mirtazapine  (REMERON ) 30 MG tablet; Take 1 tablet (30 mg total) by mouth at bedtime.  Dispense: 30 tablet; Refill: 1  4. Hot flashes Discussed putting her back on HRT but patient is not interested in this.  She would like to try  Veozah.  However she is uninsured and this would be cost prohibitive.  She will let me know if she has been approved for Medicaid and then we can prescribe for her.  5. History of adenomatous polyp of colon 6. Screening for colon cancer Due for repeat colonoscopy. - Ambulatory referral to Gastroenterology  7. Encounter for screening mammogram for malignant neoplasm of breast Referral submitted for mammogram    Patient was given the opportunity to ask questions.  Patient verbalized understanding of the plan and was able to repeat key elements of the plan.   This documentation was completed using Paediatric nurse.  Any transcriptional errors are unintentional.  Orders Placed This Encounter  Procedures   MM 3D SCREENING MAMMOGRAM BILATERAL BREAST   Ambulatory referral to Gastroenterology   Ambulatory referral to Dermatology     Requested Prescriptions   Signed Prescriptions Disp Refills   amLODipine  (NORVASC ) 5 MG tablet 135 tablet 1    Sig: Take 1.5 tablets (7.5 mg total) by mouth daily.   mirtazapine  (REMERON ) 30 MG tablet 30 tablet 1    Sig: Take 1 tablet (30 mg total) by mouth at bedtime.   triamcinolone  ointment (KENALOG ) 0.5 % 30 g 0    Sig: Apply 1 Application topically 2 (two) times daily.   valsartan -hydrochlorothiazide  (DIOVAN -HCT) 80-12.5 MG tablet 90 tablet 3    Sig: Take 1 tablet by mouth daily.Stop hydrochlorothiazide  25 mg.    Return in about 4 months (around 07/31/2024).  Barnie Louder, MD, FACP

## 2024-03-31 NOTE — Patient Instructions (Signed)
 VISIT SUMMARY:  Today, you were seen for a recurrent itchy rash that has been bothering you for several years, as well as for your hypertension and hot flashes following your hysterectomy.  YOUR PLAN:  -CHRONIC PRURITIC SKIN LESIONS: You have a chronic itchy rash on your right shoulder, back, and left upper abdomen that has been present for several years. We will refer you to a dermatologist for further evaluation and management. In the meantime, you should apply triamcinolone  cream to the affected areas twice daily, but avoid using it on your face. Please take photographs of the lesions for documentation purposes. Additionally, you should apply for Medicaid or the [facility] Discount Card to help with the dermatology referral.  -HYPERTENSION: Your blood pressure is not well controlled with your current medications. We will add Diovan  (valsartan ) with hydrochlorothiazide  to your regimen and discontinue your current hydrochlorothiazide  once you start Diovan . Continue taking amlodipine  7.5 mg daily and metoprolol  50 mg daily. We aim to get your blood pressure to 130/80 mmHg or lower. Please recheck your blood pressure at your next visit.  -HOT FLASHES SECONDARY TO SURGICAL MENOPAUSE: You are experiencing hot flashes following your hysterectomy. We discussed Vioza as a non-hormonal option, but it requires insurance coverage and liver monitoring. If you are approved for Medicaid, we can consider starting Vioza and will monitor your liver function tests accordingly.  INSTRUCTIONS:  Please follow up with a dermatologist for your skin lesions and recheck your blood pressure at your next visit. If you are approved for Medicaid, we can consider starting Vioza for your hot flashes and will monitor your liver function tests.

## 2024-05-15 ENCOUNTER — Other Ambulatory Visit: Payer: Self-pay

## 2024-05-15 ENCOUNTER — Other Ambulatory Visit: Payer: Self-pay | Admitting: Internal Medicine

## 2024-05-15 DIAGNOSIS — N951 Menopausal and female climacteric states: Secondary | ICD-10-CM

## 2024-05-15 MED ORDER — SERTRALINE HCL 50 MG PO TABS
50.0000 mg | ORAL_TABLET | Freq: Every day | ORAL | 0 refills | Status: DC
  Filled 2024-05-15: qty 90, 90d supply, fill #0

## 2024-05-18 ENCOUNTER — Other Ambulatory Visit: Payer: Self-pay

## 2024-07-30 ENCOUNTER — Other Ambulatory Visit: Payer: Self-pay | Admitting: Internal Medicine

## 2024-07-30 ENCOUNTER — Other Ambulatory Visit: Payer: Self-pay

## 2024-07-30 DIAGNOSIS — G4700 Insomnia, unspecified: Secondary | ICD-10-CM

## 2024-07-30 MED ORDER — FOLIC ACID 1 MG PO TABS
1.0000 mg | ORAL_TABLET | Freq: Every day | ORAL | 3 refills | Status: AC
  Filled 2024-07-30: qty 90, 90d supply, fill #0

## 2024-07-30 MED ORDER — MIRTAZAPINE 30 MG PO TABS
30.0000 mg | ORAL_TABLET | Freq: Every day | ORAL | 1 refills | Status: AC
  Filled 2024-07-30: qty 30, 30d supply, fill #0
  Filled 2024-09-14: qty 30, 30d supply, fill #1

## 2024-07-30 MED ORDER — METHOTREXATE SODIUM 2.5 MG PO TABS
10.0000 mg | ORAL_TABLET | ORAL | 1 refills | Status: AC
  Filled 2024-07-30: qty 16, 28d supply, fill #0
  Filled 2024-09-14: qty 16, 28d supply, fill #1

## 2024-07-31 ENCOUNTER — Ambulatory Visit: Admitting: Internal Medicine

## 2024-08-03 ENCOUNTER — Other Ambulatory Visit: Payer: Self-pay

## 2024-08-14 ENCOUNTER — Other Ambulatory Visit: Payer: Self-pay | Admitting: Internal Medicine

## 2024-08-14 ENCOUNTER — Other Ambulatory Visit: Payer: Self-pay

## 2024-08-14 ENCOUNTER — Other Ambulatory Visit: Payer: Self-pay | Admitting: Physician Assistant

## 2024-08-14 DIAGNOSIS — I10 Essential (primary) hypertension: Secondary | ICD-10-CM

## 2024-08-14 DIAGNOSIS — N951 Menopausal and female climacteric states: Secondary | ICD-10-CM

## 2024-08-14 MED ORDER — SERTRALINE HCL 50 MG PO TABS
50.0000 mg | ORAL_TABLET | Freq: Every day | ORAL | 0 refills | Status: AC
  Filled 2024-08-14: qty 90, 90d supply, fill #0

## 2024-08-14 MED ORDER — METOPROLOL SUCCINATE ER 50 MG PO TB24
50.0000 mg | ORAL_TABLET | Freq: Every day | ORAL | 0 refills | Status: AC
  Filled 2024-08-14: qty 90, 90d supply, fill #0

## 2024-08-25 ENCOUNTER — Other Ambulatory Visit: Payer: Self-pay

## 2024-09-14 ENCOUNTER — Other Ambulatory Visit: Payer: Self-pay

## 2024-09-15 ENCOUNTER — Other Ambulatory Visit: Payer: Self-pay

## 2024-09-15 MED ORDER — METHOTREXATE SODIUM 2.5 MG PO TABS: 10.0000 mg | ORAL_TABLET | ORAL | 1 refills | Status: AC

## 2024-09-22 ENCOUNTER — Ambulatory Visit: Admitting: Internal Medicine
